# Patient Record
Sex: Male | Born: 1950 | Hispanic: Yes | Marital: Married | Smoking: Never smoker
Health system: Southern US, Community
[De-identification: ages and names within clinical notes are randomized; demographics above are authoritative.]

## PROBLEM LIST (undated history)

## (undated) DIAGNOSIS — F32A Depression, unspecified: Secondary | ICD-10-CM

## (undated) DIAGNOSIS — E785 Hyperlipidemia, unspecified: Secondary | ICD-10-CM

## (undated) DIAGNOSIS — I82409 Acute embolism and thrombosis of unspecified deep veins of unspecified lower extremity: Secondary | ICD-10-CM

## (undated) DIAGNOSIS — R4189 Other symptoms and signs involving cognitive functions and awareness: Secondary | ICD-10-CM

## (undated) DIAGNOSIS — R569 Unspecified convulsions: Secondary | ICD-10-CM

## (undated) DIAGNOSIS — I639 Cerebral infarction, unspecified: Secondary | ICD-10-CM

## (undated) DIAGNOSIS — F329 Major depressive disorder, single episode, unspecified: Secondary | ICD-10-CM

## (undated) DIAGNOSIS — R449 Unspecified symptoms and signs involving general sensations and perceptions: Secondary | ICD-10-CM

## (undated) HISTORY — DX: Major depressive disorder, single episode, unspecified: F32.9

## (undated) HISTORY — DX: Other symptoms and signs involving cognitive functions and awareness: R41.89

## (undated) HISTORY — PX: NO PAST SURGERIES: SHX2092

## (undated) HISTORY — DX: Hyperlipidemia, unspecified: E78.5

## (undated) HISTORY — DX: Unspecified symptoms and signs involving general sensations and perceptions: R44.9

## (undated) HISTORY — DX: Acute embolism and thrombosis of unspecified deep veins of unspecified lower extremity: I82.409

## (undated) HISTORY — DX: Unspecified convulsions: R56.9

## (undated) HISTORY — DX: Cerebral infarction, unspecified: I63.9

## (undated) HISTORY — DX: Depression, unspecified: F32.A

---

## 2016-07-15 ENCOUNTER — Encounter: Payer: Self-pay | Admitting: Family Medicine

## 2017-03-22 ENCOUNTER — Encounter: Payer: Self-pay | Admitting: Family Medicine

## 2017-05-16 ENCOUNTER — Ambulatory Visit (INDEPENDENT_AMBULATORY_CARE_PROVIDER_SITE_OTHER): Payer: Self-pay | Admitting: Family Medicine

## 2017-05-16 ENCOUNTER — Encounter: Payer: Self-pay | Admitting: Family Medicine

## 2017-05-16 VITALS — BP 126/75 | HR 60 | Temp 98.0°F | Resp 16 | Ht 65.0 in | Wt 189.0 lb

## 2017-05-16 DIAGNOSIS — I10 Essential (primary) hypertension: Secondary | ICD-10-CM

## 2017-05-16 DIAGNOSIS — Z23 Encounter for immunization: Secondary | ICD-10-CM

## 2017-05-16 DIAGNOSIS — Z8673 Personal history of transient ischemic attack (TIA), and cerebral infarction without residual deficits: Secondary | ICD-10-CM

## 2017-05-16 DIAGNOSIS — Z789 Other specified health status: Secondary | ICD-10-CM

## 2017-05-16 DIAGNOSIS — E7849 Other hyperlipidemia: Secondary | ICD-10-CM

## 2017-05-16 DIAGNOSIS — R531 Weakness: Secondary | ICD-10-CM

## 2017-05-16 DIAGNOSIS — K219 Gastro-esophageal reflux disease without esophagitis: Secondary | ICD-10-CM

## 2017-05-16 DIAGNOSIS — R001 Bradycardia, unspecified: Secondary | ICD-10-CM

## 2017-05-16 DIAGNOSIS — Z131 Encounter for screening for diabetes mellitus: Secondary | ICD-10-CM

## 2017-05-16 DIAGNOSIS — G4700 Insomnia, unspecified: Secondary | ICD-10-CM

## 2017-05-16 DIAGNOSIS — R42 Dizziness and giddiness: Secondary | ICD-10-CM

## 2017-05-16 LAB — POCT GLYCOSYLATED HEMOGLOBIN (HGB A1C): Hemoglobin A1C: 5.4

## 2017-05-16 MED ORDER — PANTOPRAZOLE SODIUM 40 MG PO TBEC: 40.0000 mg | DELAYED_RELEASE_TABLET | Freq: Every day | ORAL | 5 refills | Status: DC

## 2017-05-16 MED ORDER — PRAVASTATIN SODIUM 40 MG PO TABS: 40.0000 mg | ORAL_TABLET | Freq: Every day | ORAL | 5 refills | Status: DC

## 2017-05-16 MED ORDER — MECLIZINE HCL 25 MG PO TABS: 25.0000 mg | ORAL_TABLET | Freq: Three times a day (TID) | ORAL | 2 refills | Status: DC | PRN

## 2017-05-16 MED ORDER — AMLODIPINE BESYLATE 5 MG PO TABS: 5.0000 mg | ORAL_TABLET | Freq: Every day | ORAL | 5 refills | Status: DC

## 2017-05-16 NOTE — Patient Instructions (Addendum)
Will follow up by phone with any abnormal laboratory results [facility].  Complete financial assistance forms.  Will notify previous neurologist  Dr Raymond GurneyFredric Radoff for information concerning Eliquis and Plavix.   Mareos Dizziness Los mareos son un problema muy frecuente. Se trata de una sensacin de inestabilidad o desvanecimiento. Puede sentir que se va a desmayar. Los Golden West Financialmareos pueden provocarle una lesin si se tropieza o se cae. Las Dealerpersonas de todas las edades pueden sufrir Research scientist (life sciences)mareos, Biomedical engineerpero es ms frecuente en los adultos Banner Hillmayores. Esta afeccin puede tener muchas causas, entre las que se pueden Assurantmencionar los medicamentos, la deshidratacin y [place] enfermedades. Siga estas indicaciones en su casa: Comida y bebida  Beba suficiente lquido como para mantener la orina clara o de color amarillo plido. Esto evita la deshidratacin. Trate de beber ms lquidos transparentes, como agua.  No beba alcohol.  Limite el consumo de cafena si el mdico se lo indica. Verifique los ingredientes y la informacin nutricional para saber si un alimento o una bebida contienen cafena.  Limite el consumo de sal (sodio) si el mdico se lo indica. Verifique los ingredientes y la informacin nutricional para saber si un alimento o una bebida contienen sodio. Actividad  Evite los movimientos rpidos. ? Levntese de las sillas con lentitud y apyese hasta sentirse bien. ? Por la maana, sintese primero a un lado de la cama. Cuando se sienta bien, pngase lentamente de [address] mientras se sostiene de algo, hasta que sepa que ha logrado el equilibrio.  Mueva las piernas con frecuencia si debe estar de pie en un lugar durante mucho tiempo. Mientras est de pie, contraiga y relaje los msculos de las piernas.  No conduzca vehculos ni opere maquinaria pesada si se siente mareado.  Evite agacharse si se siente mareado. En su casa, coloque los objetos de modo que le resulte fcil  alcanzarlos sin Public librarianagacharse. Estilo de vida  No consuma ningn producto que contenga nicotina o tabaco, como cigarrillos y Administrator, Civil Servicecigarrillos electrnicos. Si necesita ayuda para dejar de fumar, consulte al American Expressmdico.  Trate de reducir el nivel de estrs con mtodos como el yoga o la meditacin. Hable con el mdico si necesita ayuda para controlar el nivel de estrs. Instrucciones generales  Controle sus mareos para ver si hay cambios.  Tome los medicamentos de venta libre y los recetados solamente como se lo haya indicado el mdico. Hable con el mdico si cree que los medicamentos que est tomando son la causa de sus mareos.  Infrmele a un amigo o a un familiar si se siente mareado. Pdale a esta persona que llame al mdico si observa cambios en su comportamiento.  Concurra a todas las visitas de [address] como se lo haya indicado el mdico. Esto es importante. Comunquese con un mdico si:  Los American Expressmareos persisten.  Los Golden West Financialmareos o la sensacin de Production assistant, radiodesvanecimiento empeoran.  Siente nuseas.  Se le redujo la audicin.  Aparecen nuevos sntomas.  Cuando est de pie, se siente inestable o que la habitacin da vueltas. Solicite ayuda de inmediato si:  Vomita o tiene diarrea y no puede comer ni beber nada.  Tiene dificultad para hablar, caminar, tragar o Boeingusar los brazos, las manos o las piernas.  Se siente usualmente dbil.  No piensa con claridad o tiene dificultad para armar oraciones. Es posible que un amigo o un familiar adviertan que esto ocurre.  Tiene dolor de pecho, dolor abdominal, sudoracin o Company secretaryle falta el aire.  Sufre cambios en la visin.  Tiene cualquier tipo de sangrado.  Tiene dolor de cabeza intenso.  Tiene dolor o rigidez en el cuello.  Tiene fiebre. Estos sntomas pueden representar un problema grave que constituye Radio broadcast assistant. No espere hasta que los sntomas desaparezcan. Solicite atencin mdica de inmediato. Comunquese con el servicio de emergencias de su  localidad (911 en los [place]). No conduzca por sus propios medios [employer]. Resumen  Los mareos son Neomia Dear sensacin de inestabilidad o desvanecimiento. Esta afeccin puede tener muchas causas, entre las que se pueden Assurant, la deshidratacin y [place].  Las Dealer de todas las edades pueden sufrir Research scientist (life sciences), Biomedical engineer es ms frecuente en los adultos [place].  Beba suficiente lquido como para mantener la orina clara o de color amarillo plido. No beba alcohol.  Evite los movimientos rpidos si se siente mareado. Controle sus mareos para ver si hay cambios. Esta informacin no tiene Theme park manager el consejo del mdico. Asegrese de hacerle al mdico cualquier pregunta que tenga. Document Released: 04/05/2005 Document Revised: 07/23/2016 Document Reviewed: 07/23/2016 Elsevier Interactive Patient Education  [employer].

## 2017-05-16 NOTE — Progress Notes (Signed)
Subjective:    Patient ID: Mark Robles, male    DOB: April 17, 1951, 67 y.o.   MRN: [id number]  HPI Mark Robles, a 67 year old male with a history of hypertension and CVA presents accompanied by wife to establish care.  Patient primarily speaks Spanish, utilizing video interpreter to assist with communication.  Patient recently relocated from [facility].  Patient was follow-up by both primary care and neurology.  Primary provider was Melvyn Novas, NP.  He reports a history of a CVA in 2011 resulting in left-sided weakness.  Patient completed physical therapy with minimal improvement.  His wife reports that he has decreased short-term memory.  His wife assures that he takes all prescribed medications consistently.  Patient was followed by Dr. Raymond Gurney neurologist, for history of CVA.  Patient was prescribed Plavix 75 mg daily and pravastatin 40 mg.  Patient also continued on clonazepam 1 mg twice daily for anxiety and insomnia.  Patient endorses periodic dizziness and continuous left-sided weakness.  Patient ambulates without assistance.  He denies headache, blurred vision, heart palpitations, syncope, nausea, vomiting, or diarrhea.  Patient also has a history of deep vein thrombosis to right lower extremity.  Patient was started on Eliquis 5 mg daily and has been taking medication over the past 2 years.  Patient endorses periodic bleeding to gums.  He denies rectal bleeding, abdominal pain, dysuria, or hematuria. Patient is complaining of dizziness.  Dizziness primarily occurs with standing.  He denies any recent falls.  He typically has to hold onto objects for balance.  Symptoms are exacerbated when transitioning from sitting to standing.  Patient has been treated with meclizine 25 mg 3 times per day as needed with unsatisfactory improvement.    Past Medical History:  Diagnosis Date  . Hyperlipidemia   . Stroke [facility])    Social History   Socioeconomic History  . Marital  status: Married    Spouse name: Not on file  . Number of children: Not on file  . Years of education: Not on file  . Highest education level: Not on file  Social Needs  . Financial resource strain: Not on file  . Food insecurity - worry: Not on file  . Food insecurity - inability: Not on file  . Transportation needs - medical: Not on file  . Transportation needs - non-medical: Not on file  Occupational History  . Not on file  Tobacco Use  . Smoking status: Never Smoker  . Smokeless tobacco: Never Used  Substance and Sexual Activity  . Alcohol use: No    Frequency: Never  . Drug use: No  . Sexual activity: Not on file  Other Topics Concern  . Not on file  Social History Narrative  . Not on file   There is no immunization history on file for this patient. Review of Systems  Constitutional: Negative for fever.  HENT: Negative.        Bleeding to gums  Eyes: Negative.  Negative for visual disturbance.  Respiratory: Negative.   Cardiovascular: Negative.   Gastrointestinal: Negative.   Endocrine: Negative.  Negative for polydipsia, polyphagia and polyuria.  Genitourinary: Negative.   Musculoskeletal: Negative.   Skin: Negative.   Allergic/Immunologic: Negative.   Neurological: Positive for dizziness, weakness (Less than) and light-headedness. Negative for facial asymmetry and speech difficulty.  Hematological: Negative.   Psychiatric/Behavioral: Positive for sleep disturbance.      Objective:   Physical Exam  Constitutional: He is oriented to person, place, and time. He  appears well-developed and well-nourished.  HENT:  Head: Normocephalic and atraumatic.  Right Ear: External ear normal.  Left Ear: External ear normal.  Nose: Nose normal.  Mouth/Throat: Oropharynx is clear and moist.  Eyes: Pupils are equal, round, and reactive to light.  Neck: Normal range of motion. Neck supple.  Cardiovascular: Normal rate, regular rhythm, normal heart sounds and intact distal  pulses.  Pulmonary/Chest: Effort normal and breath sounds normal.  Abdominal: Soft. Bowel sounds are normal.  Musculoskeletal: Normal range of motion.  Weakness to left side     Neurological: He is alert and oriented to person, place, and time. A sensory deficit is present. Gait abnormal.  Reflex Scores:      Tricep reflexes are 2+ on the right side and 2+ on the left side.      Bicep reflexes are 2+ on the right side and 2+ on the left side.      Brachioradialis reflexes are 2+ on the right side and 2+ on the left side.      Patellar reflexes are 2+ on the right side and 2+ on the left side.      Achilles reflexes are 2+ on the right side and 2+ on the left side. Skin: Skin is warm and dry.  Psychiatric: He has a normal mood and affect. His behavior is normal. Judgment and thought content normal.     BP 126/75 (BP Location: Right Arm, Patient Position: Sitting, Cuff Size: Large)   Pulse 60   Temp 98 F (36.7 C) (Oral)   Resp 16   Ht 5\' 5"  (1.651 m)   Wt 189 lb (85.7 kg)   SpO2 100%   BMI 31.45 kg/m  Assessment & Plan:  1. Other hyperlipidemia Patient has a history of hyperlipidemia and CVA we will continue pravastatin 40 mg with dinner and Plavix 75 mg daily.  Will also review lipid panel as results become available. - pravastatin (PRAVACHOL) 40 MG tablet; Take 1 tablet (40 mg total) by mouth daily.  Dispense: 30 tablet; Refill: 5 - Lipid Panel  2. History of CVA (cerebrovascular accident) Patient has a history of CVA in 2011.  He was started on Plavix 75 mg and statin therapy.  Patient is also taking Eliquis 5 mg twice daily for DVT.  Concerned that Plavix 75 mg and Eliquis twice daily increases overall bleeding risk.  Also, patient endorses periodic bleeding to gums.  I will review medical records as they become available to determine if continue anticoagulation therapy is warranted.  We will also send a referral to cardiology for a consult concerning further coagulation  therapy. Patient also warrants a referral to neurology for further workup and evaluation of previous CVA. Patient continues to have left-sided deficit related to CVA, he may benefit from physical and occupational therapy. Notified previous neurologist concerning medication regimen, left message awaiting return call. We will also review creatinine level while on Eliquis. - Ambulatory referral to Neurology  3. Essential hypertension Blood pressure is at goal on current medication regimen.  Will continue Norvasc 5 mg daily. Recommend the patient continues DASH diet. - amLODipine (NORVASC) 5 MG tablet; Take 1 tablet (5 mg total) by mouth daily.  Dispense: 30 tablet; Refill: 5 - CMP and Liver  4. Insomnia, unspecified type Recommend that patient continues Klonopin as previously prescribed.  5. Dizziness EKG shows sinus bradycardia.  We will send a referral to cardiology for further workup and evaluation. Patient will continue meclizine 25 mg 3 times daily as  needed for dizziness. - EKG 12-Lead - meclizine (ANTIVERT) 25 MG tablet; Take 1 tablet (25 mg total) by mouth 3 (three) times daily as needed for dizziness.  Dispense: 60 tablet; Refill: 2  6. Screening for diabetes mellitus Review hemoglobin A1c- HgB A1c  7. Gastroesophageal reflux disease without esophagitis - pantoprazole (PROTONIX) 40 MG tablet; Take 1 tablet (40 mg total) by mouth daily.  Dispense: 30 tablet; Refill: 5  8. Left-sided weakness Left-sided weakness on physical exam.  3/5 strength to left upper extremity.  Range of motion limited.  Patient also has abnormal gait with ambulation.  9. Language barrier to communication Patient primarily speaks Spanish, utilizing language line to assist with communication  10. Need for Tdap vaccination - Tdap vaccine greater than or equal to 7yo IM  11. Immunization due - Pneumococcal polysaccharide vaccine 23-valent greater than or equal to 2yo subcutaneous/IM  12.   Bradycardia Reviewed EKG, consistent with sinus bradycardia.  We will send a referral to cardiology for further workup and evaluation  RTC: 3 months for chronic conditions  Nolon Nations  MSN, FNP-C Patient Care [facility] [address] [place], [place] [address] [phone]

## 2017-05-17 ENCOUNTER — Telehealth: Payer: Self-pay

## 2017-05-17 LAB — LIPID PANEL
Chol/HDL Ratio: 3.8 ratio (ref 0.0–5.0)
Cholesterol, Total: 200 mg/dL — ABNORMAL HIGH (ref 100–199)
HDL: 52 mg/dL (ref >39)
LDL Calculated: 114 mg/dL — ABNORMAL HIGH (ref 0–99)
Triglycerides: 170 mg/dL — ABNORMAL HIGH (ref 0–149)
VLDL Cholesterol Cal: 34 mg/dL (ref 5–40)

## 2017-05-17 LAB — CMP AND LIVER
ALT: 26 IU/L (ref 0–44)
AST: 24 IU/L (ref 0–40)
Albumin: 4.4 g/dL (ref 3.6–4.8)
Alkaline Phosphatase: 43 IU/L (ref 39–117)
BILIRUBIN TOTAL: 0.3 mg/dL (ref 0.0–1.2)
BUN: 16 mg/dL (ref 8–27)
Bilirubin, Direct: 0.1 mg/dL (ref 0.00–0.40)
CHLORIDE: 101 mmol/L (ref 96–106)
CO2: 24 mmol/L (ref 20–29)
Calcium: 9.5 mg/dL (ref 8.6–10.2)
Creatinine, Ser: 0.94 mg/dL (ref 0.76–1.27)
GFR calc Af Amer: 99 mL/min/{1.73_m2} (ref >59)
GFR calc non Af Amer: 85 mL/min/{1.73_m2} (ref >59)
Glucose: 80 mg/dL (ref 65–99)
Potassium: 4.2 mmol/L (ref 3.5–5.2)
Sodium: 140 mmol/L (ref 134–144)
Total Protein: 6.8 g/dL (ref 6.0–8.5)

## 2017-05-17 NOTE — Telephone Encounter (Signed)
Called using McKessonLanguage Resources Line [phone]#[id number]. No answer. Message was left for patient to call back and left call back number. Thanks!

## 2017-05-17 NOTE — Telephone Encounter (Signed)
-----   Message from Massie MaroonLachina M Hollis, OregonFNP sent at 05/17/2017 10:47 AM EST ----- Regarding: lab results Please inform patient that cholesterol is elevated. Continue statin therapy and Plavix 75 mg as previously prescribed. Patient is also taking eliquis for history of DVT, he is to continue medication at this time. I have place a referral to cardiology for further workup and evaluation.  I have also sent a referral to neurology for stroke. Please call to obtain cash pay options for patient.  Remind his wife to complete financial assistance forms.   Thanks

## 2017-05-19 NOTE — Telephone Encounter (Signed)
Called no answer Using interpreter ID [phone]. Message was left for patient to call back. Thanks!

## 2017-05-20 NOTE — Telephone Encounter (Signed)
I have been unable to contact patient will mail letter. Thanks!

## 2017-05-20 NOTE — Telephone Encounter (Signed)
Called using McKessonLanguage Resources Line ID [phone]. NO answer ,left voicemail for patient to call back. Thanks!

## 2017-05-24 ENCOUNTER — Ambulatory Visit: Payer: Self-pay | Attending: Internal Medicine

## 2017-06-06 ENCOUNTER — Ambulatory Visit: Payer: Self-pay | Attending: Family Medicine

## 2017-06-16 ENCOUNTER — Other Ambulatory Visit: Payer: Self-pay

## 2017-06-16 ENCOUNTER — Telehealth: Payer: Self-pay

## 2017-06-16 MED ORDER — APIXABAN 5 MG PO TABS: 5.0000 mg | ORAL_TABLET | Freq: Two times a day (BID) | ORAL | 2 refills | Status: DC

## 2017-06-16 MED FILL — !ELIQUIS 5MG TABLET: 5 | 30 days supply | Qty: 60 | Fill #0

## 2017-06-16 NOTE — Telephone Encounter (Signed)
Patient's wife came in office today and asked for a refill for clonazepam for patient. She was told to get refilled at Neurology but he is not scheduled for Neurology until April 29th. This is first avaliable. He only has a couple of tablets left. Please advise.

## 2017-06-20 ENCOUNTER — Ambulatory Visit: Payer: Self-pay | Attending: Family Medicine

## 2017-06-20 ENCOUNTER — Encounter: Payer: Self-pay | Admitting: Neurology

## 2017-06-20 ENCOUNTER — Ambulatory Visit: Payer: Self-pay | Admitting: Neurology

## 2017-06-20 ENCOUNTER — Other Ambulatory Visit: Payer: Self-pay | Admitting: Family Medicine

## 2017-06-20 VITALS — BP 123/81 | HR 61 | Ht 67.0 in | Wt 193.0 lb

## 2017-06-20 DIAGNOSIS — G8110 Spastic hemiplegia affecting unspecified side: Secondary | ICD-10-CM | POA: Insufficient documentation

## 2017-06-20 DIAGNOSIS — G4700 Insomnia, unspecified: Secondary | ICD-10-CM

## 2017-06-20 DIAGNOSIS — G8114 Spastic hemiplegia affecting left nondominant side: Secondary | ICD-10-CM

## 2017-06-20 DIAGNOSIS — I679 Cerebrovascular disease, unspecified: Secondary | ICD-10-CM

## 2017-06-20 MED ORDER — CLOPIDOGREL BISULFATE 75 MG PO TABS: 75.0000 mg | ORAL_TABLET | Freq: Every day | ORAL | 11 refills | Status: DC

## 2017-06-20 MED ORDER — CLONAZEPAM 1 MG PO TABS: 1.0000 mg | ORAL_TABLET | Freq: Every day | ORAL | 0 refills | Status: DC

## 2017-06-20 MED FILL — CLOPIDOGREL 75 MG TABLET: 75 | 30 days supply | Qty: 30 | Fill #0

## 2017-06-20 NOTE — Progress Notes (Signed)
Mark Robles, a 67 year old male with a history of CVA and insomnia is requesting a refill on clonazepam 2 mg. Patient was prescribed medication prior to establishing care. Reviewed previous notes and medication was prescribed by neurologist. I will begin to titrate medication and transition to non habit forming sleep aid.   Meds ordered this encounter  Medications  . clonazePAM (KLONOPIN) 1 MG tablet    Sig: Take 1 tablet (1 mg total) by mouth at bedtime.    Dispense:  30 tablet    Refill:  0    Order Specific Question:   Supervising Provider    Answer:   Quentin AngstJEGEDE, OLUGBEMIGA E [[id number]]    Mark NationsLachina Moore Tyeson Tanimoto  MSN, FNP-C Patient Care [facility] [address]  [place], [place] [id number] [phone]

## 2017-06-20 NOTE — Progress Notes (Signed)
[facility]    Provider:  Dr Lucia GaskinsAhern Referring Provider: Massie MaroonHollis, Lachina M, FNP Primary Care Physician:  Massie MaroonHollis, Lachina M, FNP  CC:  strokes  HPI:  [name] is a 67 y.o. male here as a referral from Dr. Hart RochesterHollis for stroke. PMHx HTN. Patient is here with wife who provides much information. And interpreter. He had a stroke in 2011, he was eating and he had a facial droop, left sided weakness. He has had 3 strokes total. First one was in 2002 and "very small" due to high colesterol, recovered quickly, in 2007 and [place] and then in 2011 he had a stroke with left-sided weakness. They told him it was due to HLD but he didn';t take the medication. They changed their diet and he was exercising. Bt in 2011 he had a severe stroke, he was "on machines" for a week and almost died, brain had a lot of inflammation in the brain. He has memory loss since then. He endorses compliance to medications. They say they were on Plavix and Eliquis but ran out of Plavix last week. They have been on Plavix and Eliquis for three years. The last time they saw a neurologist was 6 months ago. They had an MRI brain last year. He has left sided numbness and weakness, improving.   Reviewed notes, labs and imaging from outside physicians, which showed:  Reviewed primary care notes, patient primarily speaks Bah[place].  [place] from [place], [place] in [place].  He reports a history of CVA in 2011 resulting in left-sided weakness.  He has decreased short-term memory.  He has been followed by neurologist for history of CVA.  He was prescribed Plavix 75 mg.  Also on pravastatin 40.  Patient is on clonazepam twice a day for anxiety and insomnia.  He has chronic left-sided weakness.  Ambulates without assistance.  Denies headaches or other symptoms.  He also has a history of deep vein thrombosis to the right lower extremity.  He was started on Eliquis 5  mg daily and has been taking medication over the past 2 years.  He also has dizziness on standing, denies any recent falls.  Symptoms are exacerbated when transitioning from sitting to standing.  Meclizine as needed.  ldl 114, hgba1c 5.4   Review of Systems: Patient complains of symptoms per HPI as well as the following symptoms: weakness, dizziness, cramps, incontinence, birthmarks. Pertinent negatives and positives per HPI. All others negative.   Social History   Socioeconomic History  . Marital status: Married    Spouse name: Not on file  . Number of children: 3  . Years of education: 6th grade  . Highest education level: Not on file  Social Needs  . Financial resource strain: Not on file  . Food insecurity - worry: Not on file  . Food insecurity - inability: Not on file  . Transportation needs - medical: Not on file  . Transportation needs - non-medical: Not on file  Occupational History  . Not on file  Tobacco Use  . Smoking status: Never Smoker  . Smokeless tobacco: Never Used  Substance and Sexual Activity  . Alcohol use: No    Frequency: Never  . Drug use: No  . Sexual activity: Not on file  Other Topics Concern  . Not on file  Social History Narrative   Lives at home with wife & 1 child   Right handed   No caffeine  Family History  Problem Relation Age of Onset  . Hypertension Mother        tachycardia, pacemaker  . Hypertension Sister   . Hypertension Brother   . Hypertension Maternal Uncle     Past Medical History:  Diagnosis Date  . Depression   . DVT (deep venous thrombosis) (HCC)   . Hyperlipidemia   . Left-sided sensory deficit present   . Seizure (HCC)   . Stroke [facility])     Past Surgical History:  Procedure Laterality Date  . NO PAST SURGERIES      Current Outpatient Medications  Medication Sig Dispense Refill  . amLODipine (NORVASC) 5 MG tablet Take 1 tablet (5 mg total) by mouth daily. 30 tablet 5  . apixaban (ELIQUIS) 5 MG TABS  tablet Take 1 tablet (5 mg total) by mouth 2 (two) times daily. 60 tablet 2  . citalopram (CELEXA) 10 MG tablet Take 10 mg by mouth daily.    . clonazePAM (KLONOPIN) 1 MG tablet Take 1 tablet (1 mg total) by mouth at bedtime. (Patient taking differently: Take 1-2 mg by mouth at bedtime. ) 30 tablet 0  . meclizine (ANTIVERT) 25 MG tablet Take 1 tablet (25 mg total) by mouth 3 (three) times daily as needed for dizziness. 60 tablet 2  . pantoprazole (PROTONIX) 40 MG tablet Take 1 tablet (40 mg total) by mouth daily. 30 tablet 5  . pravastatin (PRAVACHOL) 40 MG tablet Take 1 tablet (40 mg total) by mouth daily. 30 tablet 5  . clopidogrel (PLAVIX) 75 MG tablet Take 1 tablet (75 mg total) by mouth daily. 30 tablet 11   No current facility-administered medications for this visit.     Allergies as of 06/20/2017  . (No Known Allergies)    Vitals: BP 123/81 (BP Location: Right Arm, Patient Position: Sitting)   Pulse 61   Ht 5\' 7"  (1.702 m)   Wt 193 lb (87.5 kg)   BMI 30.23 kg/m  Last Weight:  Wt Readings from Last 1 Encounters:  06/20/17 193 lb (87.5 kg)   Last Height:   Ht Readings from Last 1 Encounters:  06/20/17 5\' 7"  (1.702 m)   Physical exam: Exam: Gen: NAD, conversant, well nourised, obese, well groomed                     CV: RRR, no MRG. No Carotid Bruits. No peripheral edema, warm, nontender Eyes: Conjunctivae clear without exudates or hemorrhage  Neuro: Detailed Neurologic Exam  Speech:    Speech is normal; fluent and spontaneous with normal comprehension.  Cognition:    The patient is oriented to person, place, and time;     recent and remote memory impaired;     language fluent;     Impaired attention, concentration, fund of knowledge Cranial Nerves:    The pupils are equal, round, and reactive to light. The fundi are normal and spontaneous venous pulsations are present. Visual fields are full to finger confrontation. Extraocular movements are intact. Trigeminal  sensation is intact and the muscles of mastication are normal. Left lower facial weakness. . The palate elevates in the midline. Hearing intact. Voice is normal. Shoulder shrug is normal. The tongue has normal motion without fasciculations.   Coordination:    No dysmetria except that appropriate for level of weakness   Gait:    spastic  Motor Observation:    Contractures at left elbow and finger flexion Tone:    Increased tone left arm and leg  Posture:    Posture is normal. normal erect    Strength:    Left hemiparesis     Sensation: left hemisensory loss     Reflex Exam:  DTR's:    Hyperreflexia left arm and leg Toes:    Equivocal\ Clonus:    Clonus left patellar.       Assessment/Plan:  67 year old with multiple strokes in the past, unfortunately I have no records will need to request. All ecords before making any further recommendations  - he has been on Plavix on Eliquis for years. Will request records. Will continue at this time, discussed increased risk of bleeding.  - Continue current medications - Followup with pcp about clonazepam - need all records before making any further recommendations - given multiple strokes in young patient, will request second opinion from Dr. Pearlean Brownie vascular specialist, after I receive and review all the records.     Naomie Dean, MD  [facility] [address] [place], [place] [address]  Phone [phone] Fax [phone]

## 2017-06-21 NOTE — Telephone Encounter (Signed)
Called using Language Resources ID [phone]. No answer a message was left letting patient know rx has been filled and he must come to office to pick up hard copy to have filled. Asked if any questions to call back to our office and callback number was left. Thanks!

## 2017-06-23 ENCOUNTER — Telehealth: Payer: Self-pay | Admitting: *Deleted

## 2017-06-23 NOTE — Telephone Encounter (Addendum)
[facility] [phone] and Dr  Lyla SonFredric [phone]. Requesting pt medical records. 06/27/17 r/c notes , notes on Katrina desk.

## 2017-07-04 MED FILL — ?PANTOPRAZOLE SOD DR 40MG T: 40 | 30 days supply | Qty: 30 | Fill #0

## 2017-07-04 MED FILL — ?PRAVASTATIN SODIUM 40MG TA: 40 | 30 days supply | Qty: 30 | Fill #0

## 2017-07-04 MED FILL — ?MECLIZINE 25MG TAB: 25 | 20 days supply | Qty: 60 | Fill #0

## 2017-07-04 MED FILL — ?AMLODIPINE BESYLATE 5 MG T: 5 MG | 30 days supply | Qty: 30 | Fill #0

## 2017-07-25 MED FILL — $ELIQUIS 5 MG TABLET: 5 | 30 days supply | Qty: 60 | Fill #1

## 2017-07-25 MED FILL — CLOPIDOGREL 75 MG TABLET: 75 | 30 days supply | Qty: 30 | Fill #1

## 2017-07-29 MED FILL — ?PANTOPRAZOLE SO DR 40MG TA: 40 | 30 days supply | Qty: 30 | Fill #1

## 2017-07-29 MED FILL — ?AMLODIPINE BESYLATE 5 MG T: 5 MG | 30 days supply | Qty: 30 | Fill #1

## 2017-08-11 ENCOUNTER — Ambulatory Visit: Payer: Self-pay | Admitting: Cardiology

## 2017-08-12 ENCOUNTER — Encounter: Payer: Self-pay | Admitting: Cardiology

## 2017-08-12 ENCOUNTER — Ambulatory Visit (INDEPENDENT_AMBULATORY_CARE_PROVIDER_SITE_OTHER): Payer: No Typology Code available for payment source | Admitting: Cardiology

## 2017-08-12 VITALS — BP 138/60 | HR 67 | Ht 65.0 in | Wt 188.4 lb

## 2017-08-12 DIAGNOSIS — I679 Cerebrovascular disease, unspecified: Secondary | ICD-10-CM

## 2017-08-12 DIAGNOSIS — R42 Dizziness and giddiness: Secondary | ICD-10-CM

## 2017-08-12 DIAGNOSIS — E785 Hyperlipidemia, unspecified: Secondary | ICD-10-CM

## 2017-08-12 DIAGNOSIS — G8110 Spastic hemiplegia affecting unspecified side: Secondary | ICD-10-CM

## 2017-08-12 DIAGNOSIS — R001 Bradycardia, unspecified: Secondary | ICD-10-CM | POA: Insufficient documentation

## 2017-08-12 MED ORDER — ROSUVASTATIN CALCIUM 20 MG PO TABS: 20.0000 mg | ORAL_TABLET | Freq: Every day | ORAL | 3 refills | Status: DC

## 2017-08-12 MED FILL — ROSUVASTATIN CALCIUM 20 MG: 20 | 30 days supply | Qty: 30 | Fill #0

## 2017-08-12 NOTE — Progress Notes (Signed)
Cardiology Consultation:    Date:  08/12/2017   ID:  Mark Robles, DOB 07-01-1950, MRN [id number]  PCP:  Mark Maroon, FNP  Cardiologist:  Gypsy Balsam, MD   Referring MD: Mark Maroon, FNP   Chief Complaint  Patient presents with  . Bradycardia  . Cerebrovascular Accident  I dizzy  History of Present Illness:    Mark Robles is a 67 y.o. male who is being seen today for the evaluation of dizziness at the request of Mark Maroon, FNP.  He is a gentleman with 3 strokes.  Last stroke left him with left-sided weakness.  Chief complaint is dizziness anytime he transposition of the body he will get significantly dizzy he learned how to transposition of the body very carefully he never passed out during my examination when I put him on the table and laying flat he was dizzy for almost a minute when he changed position again to sitting up the same situation happened.  Interestingly his EKG showed sinus bradycardia and the reason for consultation is sinus bradycardia and dizziness.  Does have any chest pain tightness squeezing pressure burning chest.  Because of stroke he got limited mobility but still is able to walk and do things.  He never had any heart trouble.  He does have history of hypertension dyslipidemia however apparently he did not take medications previously and that would lead to those 3 strokes that he suffered from.  He was also found to have DVT and he is taking anticoagulation for it.  Past Medical History:  Diagnosis Date  . Depression   . DVT (deep venous thrombosis) (HCC)   . Hyperlipidemia   . Left-sided sensory deficit present   . Seizure (HCC)   . Stroke [facility])     Past Surgical History:  Procedure Laterality Date  . NO PAST SURGERIES      Current Medications: Current Meds  Medication Sig  . amLODipine (NORVASC) 5 MG tablet Take 1 tablet (5 mg total) by mouth daily.  Marland Kitchen apixaban (ELIQUIS) 5 MG TABS tablet  Take 1 tablet (5 mg total) by mouth 2 (two) times daily.  . clopidogrel (PLAVIX) 75 MG tablet Take 1 tablet (75 mg total) by mouth daily.  . meclizine (ANTIVERT) 25 MG tablet Take 1 tablet (25 mg total) by mouth 3 (three) times daily as needed for dizziness.  . pantoprazole (PROTONIX) 40 MG tablet Take 1 tablet (40 mg total) by mouth daily.  . pravastatin (PRAVACHOL) 40 MG tablet Take 1 tablet (40 mg total) by mouth daily.     Allergies:   Patient has no known allergies.   Social History   Socioeconomic History  . Marital status: Married    Spouse name: Not on file  . Number of children: 3  . Years of education: 6th grade  . Highest education level: Not on file  Occupational History  . Not on file  Social Needs  . Financial resource strain: Not on file  . Food insecurity:    Worry: Not on file    Inability: Not on file  . Transportation needs:    Medical: Not on file    Non-medical: Not on file  Tobacco Use  . Smoking status: Never Smoker  . Smokeless tobacco: Never Used  Substance and Sexual Activity  . Alcohol use: No    Frequency: Never  . Drug use: No  . Sexual activity: Not on file  Lifestyle  . Physical activity:  Days per week: Not on file    Minutes per session: Not on file  . Stress: Not on file  Relationships  . Social connections:    Talks on phone: Not on file    Gets together: Not on file    Attends religious service: Not on file    Active member of club or organization: Not on file    Attends meetings of clubs or organizations: Not on file    Relationship status: Not on file  Other Topics Concern  . Not on file  Social History Narrative   Lives at home with wife & 1 child   Right handed   No caffeine      Family History: The patient's family history includes Hypertension in his brother, maternal uncle, mother, and sister. ROS:   Please see the history of present illness.    All 14 point review of systems negative except as described per  history of present illness.  EKGs/Labs/Other Studies Reviewed:    The following studies were reviewed today: He is cholesterol: His LDL is 114 his HDL is 52  EKG:  EKG is  ordered today.  The ekg ordered today demonstrates normal sinus rhythm normal P interval normal QS complex duration morphology no ST-T segment changes  Recent Labs: 05/16/2017: ALT 26; BUN 16; Creatinine, Ser 0.94; Potassium 4.2; Sodium 140  Recent Lipid Panel    Component Value Date/Time   CHOL 200 (H) 05/16/2017 1045   TRIG 170 (H) 05/16/2017 1045   HDL 52 05/16/2017 1045   CHOLHDL 3.8 05/16/2017 1045   LDLCALC 114 (H) 05/16/2017 1045    Physical Exam:    VS:  BP 138/60   Pulse 67   Ht 5\' 5"  (1.651 m)   Wt 188 lb 6.4 oz (85.5 kg)   SpO2 98%   BMI 31.35 kg/m     Wt Readings from Last 3 Encounters:  08/12/17 188 lb 6.4 oz (85.5 kg)  06/20/17 193 lb (87.5 kg)  05/16/17 189 lb (85.7 kg)     GEN:  Well nourished, well developed in no acute distress HEENT: Normal NECK: No JVD; No carotid bruits LYMPHATICS: No lymphadenopathy CARDIAC: RRR, no murmurs, no rubs, no gallops RESPIRATORY:  Clear to auscultation without rales, wheezing or rhonchi  ABDOMEN: Soft, non-tender, non-distended MUSCULOSKELETAL:  No edema; No deformity  SKIN: Warm and dry NEUROLOGIC:  Alert and oriented x 3 PSYCHIATRIC:  Normal affect   ASSESSMENT:    1. 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 Dizziness   3. Sinus bradycardia   4. Dyslipidemia    PLAN:    In order of problems listed above:  1. Dizziness positional I doubt very much is related to his heart.  I suspect this is vertigo.  He did not have episode of syncope.  However with his bradycardia on the EKG that need to be investigated.  I will ask him to wear 48 hours Holter monitor to make sure there is no significant slowing down his heart.  As a part of evaluation I will ask him to have an echocardiogram to look at the left ventricle ejection  fraction as well as left atrial size. 2. History of CVA he had 3 strokes in the past I suspect the reason for that was the fact that he did not take care of his blood pressure and that he did not take his medications now he is on antiplatelets anticoagulant agent.  Antiplatelet is to prevent him  from another stroke and thrombotic is to prevent him from having another DVT.  Doing echocardiogram will help to establish the size of the left 13 which may give us some idea about potentially having atrial fibrillation however he denies having any palpitations 3. Sinus bradycardia: Holter monitor will be placed however I doubt it got a significant role in his symptomatology at this now 4. Dyslipidemia his LDL is 114.  I will switch him from pravastatin to high intensity statin will put him on Crestor 20.  Fasting lipid profile will be repeated later.   Medication Adjustments/Labs and Tests Ordered: Current medicines are reviewed at length with the patient today.  Concerns regarding medicines are outlined above.  No orders of the defined types were placed in this encounter.  No orders of the defined types were placed in this encounter.   Signed, Georgeanna Leaobert J. Luismanuel Corman, MD, Northern Arizona Va Healthcare SystemFACC. 08/12/2017 10:23 AM     [facility]

## 2017-08-12 NOTE — Patient Instructions (Signed)
Medication Instructions:  Your physician has recommended you make the following change in your medication:  STOP pravastatin START crestor 20 mg daily   Labwork: None  Testing/Procedures: Your physician has requested that you have an echocardiogram. Echocardiography is a painless test that uses sound waves to create images of your heart. It provides your doctor with information about the size and shape of your heart and how well your heart's chambers and valves are working. This procedure takes approximately one hour. There are no restrictions for this procedure.  Your physician has recommended that you wear a holter monitor. Holter monitors are medical devices that record the heart's electrical activity. Doctors most often use these monitors to diagnose arrhythmias. Arrhythmias are problems with the speed or rhythm of the heartbeat. The monitor is a small, portable device. You can wear one while you do your normal daily activities. This is usually used to diagnose what is causing palpitations/syncope (passing out). Wear for 48 hours.   [facility]: [address] [place], [place] [address].  Follow-Up: Your physician recommends that you schedule a follow-up appointment in: 1 month.   Any Other Special Instructions Will Be Listed Below (If Applicable).     If you need a refill on your cardiac medications before your next appointment, please call your pharmacy.

## 2017-08-15 ENCOUNTER — Ambulatory Visit (INDEPENDENT_AMBULATORY_CARE_PROVIDER_SITE_OTHER): Payer: Self-pay | Admitting: Family Medicine

## 2017-08-15 ENCOUNTER — Ambulatory Visit: Payer: Self-pay | Admitting: Neurology

## 2017-08-15 ENCOUNTER — Encounter: Payer: Self-pay | Admitting: Family Medicine

## 2017-08-15 VITALS — BP 125/78 | HR 63 | Temp 98.5°F | Resp 16 | Ht 65.0 in | Wt 189.0 lb

## 2017-08-15 DIAGNOSIS — I10 Essential (primary) hypertension: Secondary | ICD-10-CM

## 2017-08-15 DIAGNOSIS — Z8673 Personal history of transient ischemic attack (TIA), and cerebral infarction without residual deficits: Secondary | ICD-10-CM

## 2017-08-15 DIAGNOSIS — F419 Anxiety disorder, unspecified: Secondary | ICD-10-CM

## 2017-08-15 DIAGNOSIS — E785 Hyperlipidemia, unspecified: Secondary | ICD-10-CM

## 2017-08-15 DIAGNOSIS — R42 Dizziness and giddiness: Secondary | ICD-10-CM

## 2017-08-15 DIAGNOSIS — F411 Generalized anxiety disorder: Secondary | ICD-10-CM

## 2017-08-15 LAB — POCT URINALYSIS DIPSTICK
Bilirubin, UA: NEGATIVE
GLUCOSE UA: NEGATIVE
Ketones, UA: NEGATIVE
LEUKOCYTES UA: NEGATIVE
NITRITE UA: NEGATIVE
PROTEIN UA: NEGATIVE
Spec Grav, UA: 1.01 (ref 1.010–1.025)
Urobilinogen, UA: 0.2 E.U./dL
pH, UA: 7 (ref 5.0–8.0)

## 2017-08-15 MED ORDER — HYDROXYZINE HCL 10 MG PO TABS: 10.0000 mg | ORAL_TABLET | Freq: Three times a day (TID) | ORAL | 1 refills | Status: DC | PRN

## 2017-08-15 MED ORDER — MECLIZINE HCL 25 MG PO TABS: 25.0000 mg | ORAL_TABLET | Freq: Three times a day (TID) | ORAL | 2 refills | Status: DC | PRN

## 2017-08-15 MED FILL — MECLIZINE 25 MG TABLET: 25 | 20 days supply | Qty: 60 | Fill #0

## 2017-08-15 MED FILL — hydrOXYzine HCL 10 MG TABS: 10 | 20 days supply | Qty: 60 | Fill #0

## 2017-08-15 NOTE — Patient Instructions (Addendum)
Will follow up by phone with any abnormal laboratory results Will start a trial of hydroxizine 10 mg every 8 hours as needed for anxiety or insomnia Follow up with specialist as scheduled.  Recommend a lowfat, low carbohydrate diet divided over 5-6 small meals, increase water intake to 6-8 glasses, and 150 minutes per week of cardiovascular exercise.    El seguimiento por telfono con cualquier resultado anormal de laboratorio Comenzar un ensayo de hidroxizina 10 mg cada 8 horas segn sea necesario para la ansiedad o el insomnio Seguimiento con el especialista segn lo programado.  Recomendar una dieta baja en grasas y carbohidratos dividida en 5-6 comidas pequeas, aumentar la ingesta de agua a 6-8 vasos, y 150 minutos por semana de ejercicio cardiovascular.    Hydroxyzine capsules or tablets Qu es este medicamento? La HIDROXIZINA es un antihistamnico. Este medicamento se [place] para tratar los sntomas de [place]. Tambin se [place] para tratar la ansiedad y tensin. Se puede Retail banker en combinacin con otros medicamentos para inducir el sueo antes de una operacin [place]. Este medicamento puede ser utilizado para otros usos; si tiene alguna pregunta consulte con su proveedor de atencin mdica o con su farmacutico. MARCAS COMUNES: ANX, [place]arax, Rezine, Vistaril Qu le debo informar a mi profesional de la salud antes de tomar este medicamento? Necesita saber si usted presenta alguno de los siguientes problemas o situaciones: -otras enfermedades crnicas -dificultad para Geographical information systems officer -glaucoma -enfermedad cardiaca -enfermedad renal -enfermedad heptica -enfermedad pulmonar -una reaccin alrgica o inusual a la hidroxizina, a la cetirizina, a otros medicamentos, alimentos, colorantes o conservantes -si est embarazada o buscando quedar embarazada -si est amamantando a un beb Cmo debo utilizar este medicamento? Tome este medicamento por va oral con un vaso  lleno de agua. Siga las instrucciones de la etiqueta del [place]. Este medicamento se puede tomar con alimentos o con el estmago vaco. Tome sus dosis a intervalos regulares. No tome su medicamento con una frecuencia mayor a la indicada. Hable con su pediatra para informarse acerca del uso de este medicamento en nios. Puede requerir atencin especial. Aunque este medicamento ha sido recetado a nios tan menores como de 6 aos de edad para condiciones selectivas, las precauciones se aplican. Los pacientes de ms de 65 aos de edad pueden presentar reacciones ms fuertes y Pension scheme manager dosis [employer]. Sobredosis: Pngase en contacto inmediatamente con un centro toxicolgico o una sala de urgencia si usted cree que haya tomado demasiado medicamento. ATENCIN: [employer] es solo para usted. No comparta este medicamento con nadie. Qu sucede si me olvido de una dosis? Si olvida una dosis, tmela lo antes posible. Si es casi la hora de la prxima dosis, tome slo esa dosis. No tome dosis adicionales o dobles. Qu puede interactuar con este medicamento? -alcohol -barbitricos para el sueo o para las convulsiones -medicamentos para resfros o Environmental consultant -medicamentos para la depresin, ansiedad o trastornos emocionales -analgsicos -medicamentos para conciliar el sueo -relajantes musculares Puede ser que esta lista no menciona todas las posibles interacciones. Informe a su profesional de [employer] de [employer] Inc productos a base de hierbas, medicamentos de [employer] o suplementos nutritivos que est tomando. Si usted fuma, consume bebidas alcohlicas o si utiliza drogas ilegales, indqueselo tambin a su profesional de [employer]. Algunas sustancias pueden interactuar con su medicamento. A qu debo estar atento al usar PPL Corporation? Si los sntomas no mejoran, consulte a su mdico o a su profesional de [employer]. Puede experimentar mareos o somnolencia. No conduzca ni utilice  maquinaria ni haga  nada que Scientist, research (life sciences) en estado de alerta hasta que sepa cmo le afecta este medicamento. No se siente ni se ponga de pie con rapidez, especialmente si es un paciente de edad avanzada. Esto reduce el riesgo de mareos o [employer]. El alcohol puede interferir con el efecto de [place]. Evite consumir bebidas alcohlicas. Se le podr secar la boca. Masticar chicle sin azcar, chupar caramelos duros y tomar agua en abundancia le ayudar a mantener la boca hmeda. Si el problema no desaparece o es severo, consulte a su mdico. Este medicamento puede resecarle los ojos y provocar visin borrosa. Si [place] lentes de contacto, puede sentir ciertas molestias. Las gotas lubricantes pueden ser tiles. Si el problema no desaparece o es severo, consulte a su mdico de los ojos. Si se est realizando pruebas cutneas debido a una alergia, informe a su mdico que est recibiendo [employer]. Qu efectos secundarios puedo tener al [employer] este medicamento? Efectos secundarios que debe informar a su mdico o a Producer, television/film/video de la salud tan pronto como sea posible: -pulso cardiaco rpido o irregular -dificultad para orinar -convulsiones -hablar arrastrando las palabras o confusin -temblores Efectos secundarios que, por lo general, no requieren Psychologist, prison and probation services (debe informarlos a su mdico o a su profesional de la salud si persisten o si son molestos): -estreimiento -somnolencia -fatiga -dolor de cabeza -Programme researcher, broadcasting/film/video Puede ser que esta lista no menciona todos los posibles efectos secundarios. Comunquese a su mdico por asesoramiento mdico [employer]lett[employer]ard. Usted puede informar los efectos secundarios a la FDA por telfono al 1-800-FDA-1088. Dnde debo guardar mi medicina? Mantngala fuera del alcance de los nios. Gurdela a San[employer]-SCI, entre 15 y 30 grados C (24 y 49 grados F). Mantenga el envase bien cerrado. Deseche todo el medicamento que no haya utilizado,  despus de la fecha de vencimiento. ATENCIN: Este folleto es un resumen. Puede ser que no cubra toda la posible informacin. Si usted tiene preguntas acerca de esta medicina, consulte con su mdico, su farmacutico o su profesional de Radiographer, therapeutic.  2018 Elsevier/Gold Standard (2014-05-28 00:00:00)  Colesterol elevado High Cholesterol El colesterol elevado es una afeccin en la que la sangre tiene niveles altos de una sustancia [place], cerosa y parecida a la grasa (colesterol). El organismo humano necesita una pequea cantidad de colesterol. El hgado fabrica todo el colesterol que el organismo necesita. El exceso de colesterol proviene de los alimentos que comemos. La sangre transporta el colesterol desde el hgado a travs de los vasos sanguneos. Si tiene el colesterol elevado, este puede depositarse (formar placas) en las paredes de los vasos sanguneos (arterias). Las IT trainer y la rigidez de las arterias. Las placas de colesterol aumentan el riesgo de sufrir un infarto de miocardio y un accidente cerebrovascular. Trabaje con el mdico para [employer] Corporation concentraciones de colesterol en un rango saludable. Qu incrementa el riesgo? Es ms probable que Dietitian en las personas que:  Consumen alimentos con alto contenido de grasa animal (grasa saturada) o colesterol.  Tienen sobrepeso.  No hacen suficiente ejercicio fsico.  Tienen antecedentes familiares de colesterol elevado.  Cules son los signos o los sntomas? Esta afeccin no presenta sntomas. Cmo se diagnostica? Esta afeccin podra diagnosticarse a [employer] [place] [employer]ers de [place] de [place].  Si es mayor de 20aos, es posible que el mdico le controle el colesterol cada [id number].  Los controles pueden ser ms frecuentes si ya tuvo el colesterol elevado u  otros factores de riesgo de enfermedades cardacas.  En el anlisis de sangre de [place], se determina lo  siguiente:  El colesterol "malo" (colesterol LDL). Este es el principal tipo de colesterol que causa enfermedades cardacas. El nivel recomendado de LDL es de menos de100.  El colesterol "bueno" (colesterol HDL). Este tipo ayuda a Health visitor las enfermedades cardacas limpiando las arterias y arrastrando el LDL. El nivel recomendado de HDL es de60 o superior.  Triglicridos. Estos son grasas que el organismo puede Academic librarian o quemar como fuente de [place]. El nivel recomendado de triglicridos es de menos de 150.  Colesterol total. Esta es una medicin de la cantidad total de colesterol en la sangre, que incluye el colesterol LDL, el colesterol HDL y los triglicridos. El valor saludable es de menos de200.  Cmo se trata? Esta afeccin se trata con cambios en la dieta y en el estilo de vida, y con medicamentos. Cambios en la Coca Cola, la ingesta de una mayor cantidad de cereales integrales, frutas, verduras, frutos secos y pescado.  Tambin podran incluir la reduccin del consumo de carnes rojas y alimentos con Medical laboratory scientific officer. Cambios en el estilo de vida  Entre ellos, realizar sesiones de ejercicios aerbicos durante, por lo menos, , 3veces por semana. Por ejemplo, caminar, andar en bicicleta y nadar. Los ejercicios aerbicos junto con una dieta sana pueden ayudar a que se Dietitian en un peso saludable.  Los cambios tambin podran incluir dejar de fumar. Medicamentos  Por lo general, se administran medicamentos si con los Mudlogger y en el estilo de vida no se logra reducir el colesterol hasta niveles saludables.  El mdico podra recetarle estatinas. Se ha demostrado que las estatinas [employer] Co niveles de [place], lo que puede reducir el riesgo de Marine scientist una enfermedad cardaca. Siga estas indicaciones en su casa: Comida y bebida  Si se lo indic el mdico:  Coma pollo (sin piel), pescado, ternera, mariscos, pechuga de  [place] y cortes de carne roja de pulpa o de lomo.  No coma alimentos fritos ni carnes grasosas, como salchichas y salame.  Coma muchas frutas, como manzanas.  Coma gran cantidad de verduras, como brcoli, papas y zanahorias.  Coma porotos, guisantes secos y lentejas.  Coma cereales, como cebada, arroz, cuscs y trigo burgol.  Coma pastas sin salsas con crema.  Tome PPG Industries o semidescremada, y coma yogures y quesos descremados o semidescremados.  No coma ni beba Eastman Kodak, crema, helado, yemas de huevo ni quesos duros.  No coma margarinas en barra ni untables que contengan grasas trans (que tambin se conocen como aceites parcialmente hidrogenados).  No coma aceites tropicales saturados, como el de coco y el de [place].  No coma tortas, galletas, galletitas ni otros productos horneados que contengan grasas trans.  Instrucciones generales  Haga ejercicio segn las indicaciones del mdico. Aumente la cantidad de ejercicio fsico que realiza mediante actividades como jardinera, salir a Advertising account planner o usar las escaleras.  Tome los medicamentos de venta libre y los recetados solamente como se lo haya indicado el mdico.  No consuma ningn producto que contenga nicotina o tabaco, como cigarrillos y Administrator, Civil Service. Si necesita ayuda para dejar de fumar, consulte al mdico.  Concurra a todas las visitas de control como se lo haya indicado el mdico. Esto es importante. Comunquese con un mdico si:  Tiene dificultad para seguir una dieta sana o mantener un peso saludable.  Necesita ayuda para comenzar un programa de ejercicios.  Necesita ayuda para dejar de fumar. Solicite ayuda de inmediato si:  Midwife.  Tiene dificultad para respirar. Esta informacin no tiene Theme park manager el consejo del mdico. Asegrese de hacerle al mdico cualquier pregunta que tenga. Document Released: 04/05/2005 Document Revised: 07/12/2016 Document Reviewed:  10/04/2015 Elsevier Interactive Patient Education  [employer].

## 2017-08-15 NOTE — Progress Notes (Signed)
Subjective:    Patient ID: Mark Robles, male    DOB: 11/01/1950, 67 y.o.   MRN: [id number]  HPI A 67 year old male with a history of hypertension and CVA presents accompanied by wife for a follow up of chronic conditons.  Patient primarily speaks Spanish, utilizing video interpreter to assist with communication.  Patient recently established care after relocating to this area.  He reports a history of a CVA in 2011 resulting in left-sided weakness.  Patient completed physical therapy with minimal improvement.  His wife reports that he has decreased short-term memory.  His wife assures that he takes all prescribed medications consistently.  A referral was sent to neurology and patient established care several weeks ago.  Patient is consistently taking Plavix and rosuvastatin daily.  Patient has a history of anxiety and insomnia. He was previously taking Clonazepam BID. He has been out of medication for greater than 1 month. He says that most anxiety is centered around current state of health.   Patient endorses periodic dizziness and continuous left-sided weakness.  Patient ambulates without assistance.  He denies headache, blurred vision, heart palpitations, syncope, nausea, vomiting, or diarrhea.  Patient also has a history of deep vein thrombosis to right lower extremity.  Patient was started on Eliquis 5 mg daily and has been taking medication over the past 2 years.   Past Medical History:  Diagnosis Date  . Depression   . DVT (deep venous thrombosis) (HCC)   . Hyperlipidemia   . Left-sided sensory deficit present   . Seizure (HCC)   . Stroke [facility])    Social History   Socioeconomic History  . Marital status: Married    Spouse name: Not on file  . Number of children: 3  . Years of education: 6th grade  . Highest education level: Not on file  Occupational History  . Not on file  Social Needs  . Financial resource strain: Not on file  . Food insecurity:    Worry:  Not on file    Inability: Not on file  . Transportation needs:    Medical: Not on file    Non-medical: Not on file  Tobacco Use  . Smoking status: Never Smoker  . Smokeless tobacco: Never Used  Substance and Sexual Activity  . Alcohol use: No    Frequency: Never  . Drug use: No  . Sexual activity: Not on file  Lifestyle  . Physical activity:    Days per week: Not on file    Minutes per session: Not on file  . Stress: Not on file  Relationships  . Social connections:    Talks on phone: Not on file    Gets together: Not on file    Attends religious service: Not on file    Active member of club or organization: Not on file    Attends meetings of clubs or organizations: Not on file    Relationship status: Not on file  . Intimate partner violence:    Fear of current or ex partner: Not on file    Emotionally abused: Not on file    Physically abused: Not on file    Forced sexual activity: Not on file  Other Topics Concern  . Not on file  Social History Narrative   Lives at home with wife & 1 child   Right handed   No caffeine    Immunization History  Administered Date(s) Administered  . Pneumococcal Polysaccharide-23 05/16/2017  . Tdap 05/16/2017  Review of Systems  Constitutional: Negative for fever.  HENT: Negative.        Bleeding to gums  Eyes: Negative.  Negative for visual disturbance.  Respiratory: Negative.   Cardiovascular: Negative.   Gastrointestinal: Negative.   Endocrine: Negative.  Negative for polydipsia, polyphagia and polyuria.  Genitourinary: Negative.   Musculoskeletal: Negative.   Skin: Negative.   Allergic/Immunologic: Negative.   Neurological: Positive for dizziness, weakness (Less than) and light-headedness. Negative for facial asymmetry and speech difficulty.  Hematological: Negative.   Psychiatric/Behavioral: Positive for sleep disturbance.      Objective:   Physical Exam  Constitutional: He is oriented to person, place, and time. He  appears well-developed and well-nourished.  HENT:  Head: Normocephalic and atraumatic.  Right Ear: External ear normal.  Left Ear: External ear normal.  Nose: Nose normal.  Mouth/Throat: Oropharynx is clear and moist.  Eyes: Pupils are equal, round, and reactive to light.  Neck: Normal range of motion. Neck supple.  Cardiovascular: Normal rate, regular rhythm, normal heart sounds and intact distal pulses.  Pulmonary/Chest: Effort normal and breath sounds normal.  Abdominal: Soft. Bowel sounds are normal.  Musculoskeletal: Normal range of motion.  Weakness to left side     Neurological: He is alert and oriented to person, place, and time. A sensory deficit is present. Gait abnormal.  Reflex Scores:      Tricep reflexes are 2+ on the right side and 2+ on the left side.      Bicep reflexes are 2+ on the right side and 2+ on the left side.      Brachioradialis reflexes are 2+ on the right side and 2+ on the left side.      Patellar reflexes are 2+ on the right side and 2+ on the left side.      Achilles reflexes are 2+ on the right side and 2+ on the left side. Skin: Skin is warm and dry.  Psychiatric: He has a normal mood and affect. His behavior is normal. Judgment and thought content normal.     BP 125/78 (BP Location: Right Arm, Patient Position: Sitting, Cuff Size: Normal)   Pulse 63   Temp 98.5 F (36.9 C) (Oral)   Resp 16   Ht  (1.651 m)   Wt 189 lb (85.7 kg)   SpO2 99%   BMI 31.45 kg/m  Assessment & Plan:  Dyslipidemia Patient has a history of hyperlipidemia and CVA we will continue pravastatin 40 mg with dinner and Plavix 75 mg daily.  Will also review lipid panel as results become available. - pravastatin (PRAVACHOL) 40 MG tablet; Take 1 tablet (40 mg total) by mouth daily.  Dispense: 30 tablet; Refill: 5 - Lipid Panel  History of CVA (cerebrovascular accident) Patient has a history of CVA that occurred in 2011.  He was started on Plavix 75 mg and statin  therapy.  Patient is also taking Eliquis 5 mg twice daily for DVT.  Concerned that Plavix 75 mg and Eliquis twice daily increases overall bleeding risk.  Patient continues to have left-sided deficit related to CVA, he may benefit from physical and occupational therapy.  Essential hypertension Blood pressure is at goal on current medication regimen.  Will continue Norvasc 5 mg daily. Recommend the patient continues DASH diet  - Urinalysis Dipstick - Basic Metabolic Panel  GAD (generalized anxiety disorder) - hydrOXYzine (ATARAX/VISTARIL) 10 MG tablet; Take 1 tablet (10 mg total) by mouth every 8 (eight) hours as needed.  Dispense: 60  tablet; Refill: 1  Vertigo - meclizine (ANTIVERT) 25 MG tablet; Take 1 tablet (25 mg total) by mouth 3 (three) times daily as needed for dizziness.  Dispense: 60 tablet; Refill: 2 - PT vestibular rehab; Future   RTC: 2 months for chronic conditions  Nolon Nations  MSN, FNP-C Patient Care [facility] [address] [place], [place] [address] [phone]

## 2017-08-16 LAB — BASIC METABOLIC PANEL
BUN / CREAT RATIO: 12 (ref 10–24)
BUN: 11 mg/dL (ref 8–27)
CO2: 28 mmol/L (ref 20–29)
Calcium: 10 mg/dL (ref 8.6–10.2)
Chloride: 101 mmol/L (ref 96–106)
Creatinine, Ser: 0.9 mg/dL (ref 0.76–1.27)
GFR, EST AFRICAN AMERICAN: 104 mL/min/{1.73_m2} (ref >59)
GFR, EST NON AFRICAN AMERICAN: 90 mL/min/{1.73_m2} (ref >59)
Glucose: 77 mg/dL (ref 65–99)
POTASSIUM: 4.5 mmol/L (ref 3.5–5.2)
Sodium: 141 mmol/L (ref 134–144)

## 2017-08-16 LAB — LIPID PANEL
CHOL/HDL RATIO: 3.8 ratio (ref 0.0–5.0)
CHOLESTEROL TOTAL: 198 mg/dL (ref 100–199)
HDL: 52 mg/dL (ref >39)
LDL CALC: 101 mg/dL — AB (ref 0–99)
TRIGLYCERIDES: 224 mg/dL — AB (ref 0–149)
VLDL Cholesterol Cal: 45 mg/dL — ABNORMAL HIGH (ref 5–40)

## 2017-08-17 ENCOUNTER — Other Ambulatory Visit (HOSPITAL_COMMUNITY): Payer: No Typology Code available for payment source

## 2017-08-18 ENCOUNTER — Telehealth: Payer: Self-pay

## 2017-08-18 NOTE — Telephone Encounter (Signed)
-----   Message from Massie Maroon, [place] sent at 08/17/2017  4:16 PM EDT ----- Regarding: lab results Please inform patient that LDL cholesterol is mildly elevated. Recommend a lowfat, low carbohydrate diet divided over 5-6 small meals, increase water intake to 6-8 glasses, and 150 minutes per week of cardiovascular exercise.   Nolon Nations  MSN, FNP-C Patient Care [facility] [address] [place], [place] [address] [phone]

## 2017-08-18 NOTE — Telephone Encounter (Signed)
Called using pacific interpreter Id [phone]. Advised that cholesterol was mildly elevated and recommended a lowfat/ low carb diet over 5 to 6 small meals daily. Advised to drink 6 to 8 glasses of water daily, and to exercise 150  Minutes weekly. Thanks!

## 2017-08-24 MED FILL — $ELIQUIS 5 MG TABLET: 5 | 30 days supply | Qty: 60 | Fill #2

## 2017-08-24 MED FILL — CLOPIDOGREL 75 MG TABLET: 75 | 30 days supply | Qty: 30 | Fill #2

## 2017-08-24 MED FILL — ?PANTOPRAZOLE SO DR 40MG TA: 40 | 30 days supply | Qty: 30 | Fill #2

## 2017-08-24 MED FILL — ?AMLODIPINE BESYLATE 5 MG T: 5 MG | 30 days supply | Qty: 30 | Fill #2

## 2017-08-25 ENCOUNTER — Ambulatory Visit (INDEPENDENT_AMBULATORY_CARE_PROVIDER_SITE_OTHER): Payer: No Typology Code available for payment source

## 2017-08-25 ENCOUNTER — Ambulatory Visit (HOSPITAL_COMMUNITY): Payer: No Typology Code available for payment source | Attending: Internal Medicine

## 2017-08-25 ENCOUNTER — Other Ambulatory Visit: Payer: Self-pay

## 2017-08-25 DIAGNOSIS — R42 Dizziness and giddiness: Secondary | ICD-10-CM

## 2017-08-25 DIAGNOSIS — R001 Bradycardia, unspecified: Secondary | ICD-10-CM | POA: Insufficient documentation

## 2017-09-06 ENCOUNTER — Encounter: Payer: Self-pay | Admitting: Cardiology

## 2017-09-06 ENCOUNTER — Ambulatory Visit (INDEPENDENT_AMBULATORY_CARE_PROVIDER_SITE_OTHER): Payer: No Typology Code available for payment source | Admitting: Cardiology

## 2017-09-06 VITALS — BP 110/60 | HR 65 | Ht 65.0 in | Wt 184.0 lb

## 2017-09-06 DIAGNOSIS — E785 Hyperlipidemia, unspecified: Secondary | ICD-10-CM

## 2017-09-06 DIAGNOSIS — R001 Bradycardia, unspecified: Secondary | ICD-10-CM

## 2017-09-06 DIAGNOSIS — R42 Dizziness and giddiness: Secondary | ICD-10-CM

## 2017-09-06 DIAGNOSIS — I69351 Hemiplegia and hemiparesis following cerebral infarction affecting right dominant side: Secondary | ICD-10-CM

## 2017-09-06 NOTE — Progress Notes (Signed)
Cardiology Office Note:    Date:  09/06/2017   ID:  Mark Robles, DOB 05-18-1950, MRN [id number]  PCP:  Massie Maroon, FNP  Cardiologist:  Gypsy Balsam, MD    Referring MD: Massie Maroon, FNP   Chief Complaint  Patient presents with  . 1 month follow up  Still complaining of a lot of dizziness  History of Present Illness:    Mark Robles is a 67 y.o. male who was referred to [place] because of dizziness.  He was also noted to be bradycardic.  Holter monitor was placed did not show any significant arrhythmia.  He did have few episode of dizziness while wearing a Holter monitor which showed normal sinus rhythm without any arrhythmia that could explain his dizziness.  His dizziness is positional he may have some inner ear problem.  I think he can benefit from ENT referral.  I will also ask him to have carotid ultrasounds including looking at his vertebral arteries to make sure there is no significant narrowing there.  His medical therapy appears to be appropriate for now.  In 2 weeks we will recheck his fasting lipid profile.  Past Medical History:  Diagnosis Date  . Depression   . DVT (deep venous thrombosis) (HCC)   . Hyperlipidemia   . Left-sided sensory deficit present   . Seizure (HCC)   . Stroke [facility])     Past Surgical History:  Procedure Laterality Date  . NO PAST SURGERIES      Current Medications: Current Meds  Medication Sig  . amLODipine (NORVASC) 5 MG tablet Take 1 tablet (5 mg total) by mouth daily.  Mar[place] Kitchen apixaban (ELIQUIS) 5 MG TABS tablet Take 1 tablet (5 mg total) by mouth 2 (two) times daily.  . clopidogrel (PLAVIX) 75 MG tablet Take 1 tablet (75 mg total) by mouth daily.  . hydrOXYzine (ATARAX/VISTARIL) 10 MG tablet Take 1 tablet (10 mg total) by mouth every 8 (eight) hours as needed.  . meclizine (ANTIVERT) 25 MG tablet Take 1 tablet (25 mg total) by mouth 3 (three) times daily as needed for dizziness.  . pantoprazole  (PROTONIX) 40 MG tablet Take 1 tablet (40 mg total) by mouth daily.  . rosuvastatin (CRESTOR) 20 MG tablet Take 1 tablet (20 mg total) by mouth daily.     Allergies:   Patient has no known allergies.   Social History   Socioeconomic History  . Marital status: Married    Spouse name: Not on file  . Number of children: 3  . Years of education: 6th grade  . Highest education level: Not on file  Occupational History  . Not on file  Social Needs  . Financial resource strain: Not on file  . Food insecurity:    Worry: Not on file    Inability: Not on file  . Transportation needs:    Medical: Not on file    Non-medical: Not on file  Tobacco Use  . Smoking status: Never Smoker  . Smokeless tobacco: Never Used  Substance and Sexual Activity  . Alcohol use: No    Frequency: Never  . Drug use: No  . Sexual activity: Not on file  Lifestyle  . Physical activity:    Days per week: Not on file    Minutes per session: Not on file  . Stress: Not on file  Relationships  . Social connections:    Talks on phone: Not on file    Gets together: Not on file  Attends religious service: Not on file    Active member of club or organization: Not on file    Attends meetings of clubs or organizations: Not on file    Relationship status: Not on file  Other Topics Concern  . Not on file  Social History Narrative   Lives at home with wife & 1 child   Right handed   No caffeine      Family History: The patient's family history includes Hypertension in his brother, maternal uncle, mother, and sister. ROS:   Please see the history of present illness.    All 14 point review of systems negative except as described per history of present illness  EKGs/Labs/Other Studies Reviewed:      Recent Labs: 05/16/2017: ALT 26 08/15/2017: BUN 11; Creatinine, Ser 0.90; Potassium 4.5; Sodium 141  Recent Lipid Panel    Component Value Date/Time   CHOL 198 08/15/2017 1025   TRIG 224 (H) 08/15/2017  1025   HDL 52 08/15/2017 1025   CHOLHDL 3.8 08/15/2017 1025   LDLCALC 101 (H) 08/15/2017 1025    Physical Exam:    VS:  BP 110/60   Pulse 65   Ht  (1.651 m)   Wt 184 lb (83.5 kg)   SpO2 97%   BMI 30.62 kg/m     Wt Readings from Last 3 Encounters:  09/06/17 184 lb (83.5 kg)  08/15/17 189 lb (85.7 kg)  08/12/17 188 lb 6.4 oz (85.5 kg)     GEN:  Well nourished, well developed in no acute distress HEENT: Normal NECK: No JVD; No carotid bruits LYMPHATICS: No lymphadenopathy CARDIAC: RRR, no murmurs, no rubs, no gallops RESPIRATORY:  Clear to auscultation without rales, wheezing or rhonchi  ABDOMEN: Soft, non-tender, non-distended MUSCULOSKELETAL:  No edema; No deformity  SKIN: Warm and dry LOWER EXTREMITIES: no swelling NEUROLOGIC:  Alert and oriented x 3 PSYCHIATRIC:  Normal affect   ASSESSMENT:    1. Sinus bradycardia   2. Dyslipidemia   3. Vertigo   4. Spastic hemiplegia of right dominant side as late effect of cerebral infarction [facility])    PLAN:    In order of problems listed above:  1. Sinus bradycardia: Noncritical.  We will continue present management obviously will avoid any sinus blocking agent. 2. Dyslipidemia: I switch him last time to high intensity statin in 2 weeks we will check his fasting lipid profile. 3. Vertigo: He will be referred to ENT service for evaluation. 4. Status post CVA.  Stable no new issues.  See him back in 3 months or sooner if he has a problem   Medication Adjustments/Labs and Tests Ordered: Current medicines are reviewed at length with the patient today.  Concerns regarding medicines are outlined above.  No orders of the defined types were placed in this encounter.  Medication changes: No orders of the defined types were placed in this encounter.   Signed, Georgeanna Lea, MD, [facility] 09/06/2017 11:13 AM    [facility]

## 2017-09-06 NOTE — Patient Instructions (Signed)
Medication Instructions:  Your physician recommends that you continue on your current medications as directed. Please refer to the Current Medication list given to you today.   Labwork: Your physician recommends that you return for lab work in 2 weeks: lipid panel. Please fast beforehand.   Testing/Procedures: Your physician has requested that you have a carotid duplex. This test is an ultrasound of the carotid arteries in your neck. It looks at blood flow through these arteries that supply the brain with blood. Allow one hour for this exam. There are no restrictions or special instructions.  **You have been referred to an ENT doctor. You will be contacted to schedule an appointment.    Follow-Up: Your physician wants you to follow-up in: 3 months. You will receive a reminder letter in the mail two months in advance. If you don't receive a letter, please call our office to schedule the follow-up appointment.   If you need a refill on your cardiac medications before your next appointment, please call your pharmacy.   Thank you for choosing [facility]! Mady Gemma, RN [phone]

## 2017-09-09 MED FILL — ROSUVASTATIN CALCIUM 20 MG: 20 | 30 days supply | Qty: 30 | Fill #1

## 2017-09-14 MED FILL — MECLIZINE 25 MG TABLET: 25 | 20 days supply | Qty: 60 | Fill #1

## 2017-09-14 MED FILL — hydrOXYzine HCL 10 MG TABS: 10 | 20 days supply | Qty: 60 | Fill #1

## 2017-09-20 ENCOUNTER — Telehealth: Payer: Self-pay

## 2017-09-20 LAB — LIPID PANEL
CHOLESTEROL TOTAL: 132 mg/dL (ref 100–199)
Chol/HDL Ratio: 2.6 ratio (ref 0.0–5.0)
HDL: 50 mg/dL (ref >39)
LDL CALC: 63 mg/dL (ref 0–99)
Triglycerides: 96 mg/dL (ref 0–149)
VLDL Cholesterol Cal: 19 mg/dL (ref 5–40)

## 2017-09-20 NOTE — Telephone Encounter (Signed)
Left voicemail regarding labs

## 2017-09-22 ENCOUNTER — Ambulatory Visit (HOSPITAL_BASED_OUTPATIENT_CLINIC_OR_DEPARTMENT_OTHER)
Admission: RE | Admit: 2017-09-22 | Discharge: 2017-09-22 | Disposition: A | Discharge status: Home / Self Care | Payer: Self-pay | Source: Ambulatory Visit | Attending: Cardiology | Admitting: Cardiology

## 2017-09-22 DIAGNOSIS — R42 Dizziness and giddiness: Secondary | ICD-10-CM | POA: Insufficient documentation

## 2017-09-22 DIAGNOSIS — I771 Stricture of artery: Secondary | ICD-10-CM | POA: Insufficient documentation

## 2017-09-22 DIAGNOSIS — E785 Hyperlipidemia, unspecified: Secondary | ICD-10-CM | POA: Insufficient documentation

## 2017-09-22 NOTE — Progress Notes (Signed)
   Complete carotid artery duplex has been performed. Difficult to access beyond carotid bulb. No ICA stenosis was seen but can not be ruled out.  Dorothey BasemanReel, Arlette Schaad M 09/22/2017, 1:57 PM

## 2017-09-23 ENCOUNTER — Other Ambulatory Visit: Payer: Self-pay | Admitting: Family Medicine

## 2017-09-23 MED FILL — CLOPIDOGREL 75 MG TABLET: 75 | 30 days supply | Qty: 30 | Fill #3

## 2017-09-23 MED FILL — $ELIQUIS 5 MG TABLET: 5 | 30 days supply | Qty: 60 | Fill #0

## 2017-09-23 MED FILL — ?PANTOPRAZOLE SO DR 40MG TA: 40 | 30 days supply | Qty: 30 | Fill #3

## 2017-09-26 MED FILL — ?AMLODIPINE BESYLATE 5 MG T: 5 MG | 30 days supply | Qty: 30 | Fill #3

## 2017-10-10 MED FILL — ROSUVASTATIN CALCIUM 20 MG: 20 | 30 days supply | Qty: 30 | Fill #2

## 2017-10-14 ENCOUNTER — Other Ambulatory Visit: Payer: Self-pay | Admitting: Family Medicine

## 2017-10-14 DIAGNOSIS — F419 Anxiety disorder, unspecified: Secondary | ICD-10-CM

## 2017-10-17 MED FILL — hydrOXYzine HCL 10 MG TABS: 10 | 6 days supply | Qty: 60 | Fill #0

## 2017-10-18 MED FILL — CLOPIDOGREL 75 MG TABLET: 75 | 30 days supply | Qty: 30 | Fill #4

## 2017-10-18 MED FILL — ?PANTOPRAZOLE SO DR 40MG TA: 40 | 30 days supply | Qty: 30 | Fill #4

## 2017-10-24 MED FILL — MECLIZINE 25 MG TABLET: 25 | 20 days supply | Qty: 60 | Fill #1

## 2017-10-24 MED FILL — $ELIQUIS 5 MG TABLET: 5 | 30 days supply | Qty: 60 | Fill #1

## 2017-10-24 MED FILL — ?AMLODIPINE BESYLATE 5 MG T: 5 MG | 30 days supply | Qty: 30 | Fill #4

## 2017-10-26 ENCOUNTER — Encounter: Payer: Self-pay | Admitting: Family Medicine

## 2017-10-26 ENCOUNTER — Ambulatory Visit (INDEPENDENT_AMBULATORY_CARE_PROVIDER_SITE_OTHER): Payer: No Typology Code available for payment source | Admitting: Family Medicine

## 2017-10-26 VITALS — BP 123/81 | HR 64 | Temp 98.9°F | Resp 14 | Ht 65.0 in | Wt 180.0 lb

## 2017-10-26 DIAGNOSIS — R42 Dizziness and giddiness: Secondary | ICD-10-CM

## 2017-10-26 DIAGNOSIS — I10 Essential (primary) hypertension: Secondary | ICD-10-CM

## 2017-10-26 DIAGNOSIS — I639 Cerebral infarction, unspecified: Secondary | ICD-10-CM

## 2017-10-26 DIAGNOSIS — F320 Major depressive disorder, single episode, mild: Secondary | ICD-10-CM

## 2017-10-26 DIAGNOSIS — J301 Allergic rhinitis due to pollen: Secondary | ICD-10-CM

## 2017-10-26 LAB — POCT URINALYSIS DIPSTICK
Bilirubin, UA: NEGATIVE
Glucose, UA: NEGATIVE
Ketones, UA: NEGATIVE
Leukocytes, UA: NEGATIVE
Nitrite, UA: NEGATIVE
Protein, UA: NEGATIVE
Spec Grav, UA: 1.01 (ref 1.010–1.025)
Urobilinogen, UA: 0.2 E.U./dL
pH, UA: 7 (ref 5.0–8.0)

## 2017-10-26 MED ORDER — FLUTICASONE PROPIONATE 50 MCG/ACT NA SUSP: 2.0000 | Freq: Every day | NASAL | 6 refills | Status: AC

## 2017-10-26 MED ORDER — FLUOXETINE HCL 20 MG PO TABS: 20.0000 mg | ORAL_TABLET | Freq: Every day | ORAL | 0 refills | Status: DC

## 2017-10-26 MED FILL — FLUTICASONE PROP 50 MCG SPR: 50 | 30 days supply | Qty: 16 | Fill #0

## 2017-10-26 MED FILL — FLUoxetine HCL 20 MG TABS: 20 | 30 days supply | Qty: 30 | Fill #0

## 2017-10-26 NOTE — Patient Instructions (Signed)
Depresin mayor (Major Depressive Disorder) La depresin mayor es un trastorno del estado de nimo. No es lo mismo que sentir tristeza cuando ocurre algo malo. Este trastorno puede durar das o semanas. Trabajar o hacer cosas con la familia y los amigos puede volverse difcil debido a este trastorno. La depresin mayor puede causar lo siguiente en las personas que la padecen:  Tristeza.  Vaco.  Desesperanza.  Impotencia.  Irritabilidad. Adems de estos sentimientos, las personas que padecen este trastorno tienen por lo menos 4de estos sntomas:  Problemas para dormir.  Dormir demasiado.  Un cambio importante en el apetito.  Un cambio importante en el peso.  Falta de energa.  Sentimientos de culpa o inutilidad.  Dificultad para concentrarse, recordar o tomar decisiones.  Movimientos lentos.  Agitacin.  Pensamientos o sueos de suicidio, o de daarse a s mismas.  Intento de suicidio. CUIDADOS EN EL HOGAR  Tome los medicamentos de venta libre y los recetados solamente como se lo haya indicado el mdico.  Concurra a todas las visitas de control como se lo haya indicado el mdico. Esto incluye cualquier terapia que le recomiende el mdico.  Reduzca el nivel de estrs. Haga cosas que disfruta, como andar en bicicleta, caminar o leer un libro.  Haga ejercicio fsico con frecuencia.  Consuma una dieta saludable.  No beba alcohol.  No consuma drogas.  Busque el apoyo de sus familiares y amigos.  SOLICITE AYUDA DE INMEDIATO SI:  Empieza a escuchar voces.  Ve cosas que no existen.  Siente que las personas lo persiguen (paranoico).  Tiene pensamientos serios acerca de lastimarse a usted mismo o daar a otras personas.  Piensa en el suicidio.  Esta informacin no tiene como fin reemplazar el consejo del mdico. Asegrese de hacerle al mdico cualquier pregunta que tenga. Document Released: 03/17/2015 Document Revised: 03/17/2015 Document Reviewed:  05/01/2012 Elsevier Interactive Patient Education  2017 Elsevier Inc.  

## 2017-10-26 NOTE — Progress Notes (Signed)
Subjective   Mark Robles 67 y.o. male  [id number]  [id number]  May 31, 1950    Chief Complaint  Patient presents with  . Hypertension    A 67 year old male with a history of hypertension and CVA presents for a follow up of chronic conditons.  Patient primarily speaks Spanish, utilizing video interpreter to assist with communication.  Patient is accompanied by his wife who is the primary historian. Patient states that the dizziness continues and is not relieved with antivert. Wife states that she give the patient antivert up to 3 times per day.  Patient states that he continues to have difficulty with ambulation and weakness. Not currently involved in therapy. Has not been in 2 years. Patient states that he has an appointment next week with neurology.  Patient's wife states that he was previously prescribed clonazepam due to having 1 seizure after the stroke. Wife states that while he was taking the medication, he was very calm and could sleep well. Now she reports that he is sad and down. Irritable and does not want to do anything like usual. Wife states that he "throws a fit when he doesn't get his way". Denies SI, HI, AH, VH. Patient denies previous hx of depression or anxiety.  Patient states that he is having watery eyes and runny nose most days. Wife states that he has blackened skin under his eyes due to allergies. Not currently taking any medications.   Hypertension  This is a chronic problem. The current episode started more than 1 year ago. The problem is unchanged. The problem is controlled. Associated symptoms include anxiety and headaches. Pertinent negatives include no chest pain or palpitations. Hypertensive end-organ damage includes CVA.    Review of Systems  Constitutional: Negative.   HENT: Negative.   Respiratory: Negative.   Cardiovascular: Negative.  Negative for chest pain and palpitations.  Gastrointestinal: Negative.   Genitourinary: Negative.    Musculoskeletal:       Ambulates with a cane.   Neurological: Positive for dizziness, weakness (left sided) and headaches.  Endo/Heme/Allergies: Positive for environmental allergies.  Psychiatric/Behavioral: Positive for depression. Negative for hallucinations, memory loss, substance abuse and suicidal ideas. The patient is nervous/anxious. The patient does not have insomnia.     Objective   Physical Exam  Constitutional: He is oriented to person, place, and time. He appears well-developed and well-nourished.  Cardiovascular: Normal rate, regular rhythm, normal heart sounds and intact distal pulses.  No murmur heard. Pulmonary/Chest: Effort normal and breath sounds normal. No stridor. No respiratory distress. He has no wheezes. He has no rales.  Neurological: He is alert and oriented to person, place, and time. A sensory deficit is present.  There is left sided paralysis. Ambulating with cane.   Psychiatric: He has a normal mood and affect. His behavior is normal. Judgment and thought content normal.  Nursing note and vitals reviewed.   BP 123/81 (BP Location: Right Arm, Patient Position: Sitting, Cuff Size: Normal)   Pulse 64   Temp 98.9 F (37.2 C) (Oral)   Resp 14   Ht 5\' 5"  (1.651 m)   Wt 180 lb (81.6 kg)   SpO2 100%   BMI 29.95 kg/m   Assessment   Encounter Diagnosis  Name Primary?  . Hypertension, unspecified type Yes     Plan  1. Hypertension, unspecified type Continue with norvasc as previously prescribed.  - Urinalysis Dipstick  2. Current mild episode of major depressive disorder without prior episode (HCC) Will  start patient on prozac 20 mg QAM. Patient and wife advised that if suicidal ideations or euphoria occur, stop medication and go immediately to the nearest ED. Discussed the MOA of medication and that results may not be experienced for 4-6 weeks. May experience GI symptoms such as NVD. Do not d/c medication abruptly.  - FLUoxetine (PROZAC) 20 MG  tablet; Take 1 tablet (20 mg total) by mouth daily. In the morning  Dispense: 30 tablet; Refill: 0  3. Cerebrovascular accident (CVA), unspecified mechanism (HCC) Continue with current medications  - Ambulatory referral to Occupational Therapy  4. Seasonal allergic rhinitis due to pollen Can use otc zyrtec or claritin as previously recommended by previous provider.  Start flonase daily.  - fluticasone (FLONASE) 50 MCG/ACT nasal spray; Place 2 sprays into both nostrils daily.  Dispense: 16 g; Refill: 6 5. Dizziness: We discussed that uncontrolled allergies can cause an increase in dizziness. Also discussed somatic symptoms related to depression and anxiety.  Return to care as in 2 weeks and sooner if needed.  Patient verbalized understanding and agreed with plan of care.

## 2017-10-31 ENCOUNTER — Encounter: Payer: Self-pay | Admitting: Neurology

## 2017-10-31 ENCOUNTER — Ambulatory Visit: Payer: Self-pay | Admitting: Neurology

## 2017-10-31 VITALS — BP 122/78 | HR 63 | Ht 65.0 in | Wt 179.8 lb

## 2017-10-31 DIAGNOSIS — G8110 Spastic hemiplegia affecting unspecified side: Secondary | ICD-10-CM

## 2017-10-31 NOTE — Telephone Encounter (Signed)
Rn does not have notes from patients previous MD.

## 2017-10-31 NOTE — Progress Notes (Signed)
[facility] [address] [place]. [place] [id number] [phone]       OFFICE CONSULT NOTE  Mr. [name] Date of Birth:  03/23/1951 Medical Record Number:  [id number]   Referring MD:  Naomie DeanAntonia Ahern Reason for Referral:  Stroke second opinion HPI: Mr Sherlon HandingRodriguez is a pleasant 67 year old Hispanic male who is accompanied today by his wife and a Spanish language interpreter were present throughout this visit. History is provided mostly by the patient and his wife. Patient has past medical history of hypertension hyperlipidemia and states he is had several strokes. He states the first one happened in 2006 while he was in [place] when he had transient left hemiparesis. Records for this hospitalization are not available. He recovered very well from this and had another TIA a year later in 2007. Subsequently [place]. He had his major stroke in [place]. I do not have those medical records even though they have been sent for. He apparently had significant left hemiparesis and was hospitalized for several days. He subsequently recovered and has been able to now ambulate without assistance but does have visible left leg spasticity and stiffness as well as significant left hand weakness. The patient states he also had a possible episode of seizure about 2 years ago. He was [place] the [place] and [place]. Description as per his wife was that patient was looking from side to side and had a glazed look on his face. She denied any focal extremity tonic-clonic activity. Patient was placed on clonazepam which took a little while and then he ran out. He is had no seizure-like episodes since then. He does not remember having had an EEG for brain MRI done after this event. The patient was diagnosed to have deep and thrombosis in the right leg about 4 years ago and has been on eliquis since then. He's also on Plavix as well. He  does complain of easy bruising but he is had fortunately no bleeding episode. He has not had any follow-up lower extremity ultrasound done. There is no known history of hypercoagulable disorder., Prior DVT or pulmonary embolism. The patient's chief complaint is dizziness which he describes as a feeling of imbalance. The wife states that the patient apparently get up quickly and walks fast. He has not been using a cane. He denies true vertigo. Denies sensation of presyncope or passing out.  ROS:   14 system review of systems is positive for  eye itching, chest pain, back pain, walking difficulty, neck pain, stiffness, dizziness, allergies and all other systems negative  PMH:  Past Medical History:  Diagnosis Date  . Depression   . DVT (deep venous thrombosis) (HCC)   . Hyperlipidemia   . Left-sided sensory deficit present   . Seizure (HCC)   . Stroke [facility](HCC)     Social History:  Social History   Socioeconomic History  . Marital status: Married    Spouse name: Not on file  . Number of children: 3  . Years of education: 6th grade  . Highest education level: Not on file  Occupational History  . Not on file  Social Needs  . Financial resource strain: Not on file  . Food insecurity:    Worry: Not on file    Inability: Not on file  . Transportation needs:    Medical: Not on file    Non-medical: Not on file  Tobacco Use  . Smoking status: Never Smoker  .  Smokeless tobacco: Never Used  Substance and Sexual Activity  . Alcohol use: No    Frequency: Never  . Drug use: No  . Sexual activity: Not on file  Lifestyle  . Physical activity:    Days per week: Not on file    Minutes per session: Not on file  . Stress: Not on file  Relationships  . Social connections:    Talks on phone: Not on file    Gets together: Not on file    Attends religious service: Not on file    Active member of club or organization: Not on file    Attends meetings of clubs or organizations: Not on file     Relationship status: Not on file  . Intimate partner violence:    Fear of current or ex partner: Not on file    Emotionally abused: Not on file    Physically abused: Not on file    Forced sexual activity: Not on file  Other Topics Concern  . Not on file  Social History Narrative   Lives at home with wife & 1 child   Right handed   No caffeine     Medications:   Current Outpatient Medications on File Prior to Visit  Medication Sig Dispense Refill  . amLODipine (NORVASC) 5 MG tablet Take 1 tablet (5 mg total) by mouth daily. 30 tablet 5  . clopidogrel (PLAVIX) 75 MG tablet Take 1 tablet (75 mg total) by mouth daily. 30 tablet 11  . ELIQUIS 5 MG TABS tablet TAKE 1 TABLET BY MOUTH 2 TIMES DAILY. 60 tablet 2  . FLUoxetine (PROZAC) 20 MG tablet Take 1 tablet (20 mg total) by mouth daily. In the morning 30 tablet 0  . fluticasone (FLONASE) 50 MCG/ACT nasal spray Place 2 sprays into both nostrils daily. 16 g 6  . hydrOXYzine (ATARAX/VISTARIL) 10 MG tablet TAKE 1 TABLET BY MOUTH EVERY 8 HOURS AS NEEDED. 60 tablet 1  . meclizine (ANTIVERT) 25 MG tablet Take 1 tablet (25 mg total) by mouth 3 (three) times daily as needed for dizziness. 60 tablet 2  . pantoprazole (PROTONIX) 40 MG tablet Take 1 tablet (40 mg total) by mouth daily. 30 tablet 5  . rosuvastatin (CRESTOR) 20 MG tablet Take 1 tablet (20 mg total) by mouth daily. 30 tablet 3  . loperamide (IMODIUM) 2 MG capsule TAKE 2 CAPSULES BY MOUTH INITIALLY THEN 1 CAPSULE AFTER EACH LOOSE STOOL (DO NOT EXCEED 8 CAPSULES IN 24 HOURS)  0   No current facility-administered medications on file prior to visit.     Allergies:  No Known Allergies  Physical Exam General: well developed, well nourished middle-aged Hispanic male, seated, in no evident distress Head: head normocephalic and atraumatic.   Neck: supple with no carotid or supraclavicular bruits Cardiovascular: regular rate and rhythm, no murmurs Musculoskeletal: no deformity Skin:  no  rash/petichiae Vascular:  Normal pulses all extremities  Neurologic Exam Mental Status: Awake and fully alert. Oriented to place and time. Recent and remote memory intact. Attention span, concentration and fund of knowledge appropriate. Mood and affect appropriate.  Cranial Nerves: Fundoscopic exam reveals sharp disc margins. Pupils equal, briskly reactive to light. Extraocular movements full without nystagmus. Visual fields full to confrontation. Hearing intact. Facial sensation intact. Mild left lower facial weakness. tongue, palate moves normally and symmetrically.  Motor: Spastic left hemiplegia with 3/5 left upper extremity and 4/5 left lower extremity strength with significant weakness of left grip and intrinsic hand muscles with  non-fixed flexion contractures of the right fingers and hand. Mild weakness of left hip flexors and ankle dorsiflexors. Normal strength on the right side. Tone is increased on the left side with mild spasticity.  Sensory.: intact to touch , pinprick , position and vibratory sensation.  Coordination: Normal on the right side and impacted in the left upper and lower extremity.  Gait and Station: Arises from chair without difficulty. Spastic hemiplegic gait with circumduction of the left leg.  Reflexes: 2+ and asymmetric and brisker on the left side Toes downgoing.   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 Etiology of stroke likely small vessel disease. He is on chronic anticoagulation with eliquis for DVT. Vascular risk factors of hypertension and hyperlipidemia. Complain of dizziness mostly related to imbalance when he tries to walk quickly.     PLAN: I had a long d/w patient and his wife using a spanish language interpreter about his remote  likely right subcortical Stroke and TIAs, spastic left hemiplegia, dizziness , risk for recurrent stroke/TIAs,   and answered questions.unfortunately no prior imaging for stroke evaluation results were available for my review today Continue Plavix for secondary stroke prevention and maintain strict control of hypertension with blood pressure goal below 130/90, diabetes with hemoglobin A1c goal below 6.5% and lipids with LDL cholesterol goal below 70 mg/dL. I also advised the patient to eat a healthy diet with plenty of whole grains, cereals, fruits and vegetables, exercise regularly and maintain ideal body weight. I feel the patient's dizziness is mostly gait instability related to his spasticity.  . We will try to send for records from his previous hospitalization in [place]. all times and we discussed fall and safety prevention precautions. The patient has history of deep vein thrombosis 4 years ago and has been on eliquis am not sure whether he needs to be on it long-term. Check lower extremity venous ultrasound and if DVT is resolved may consider discontinuing eliquis. I have advised him to see his primary care physician for the same. Check EEG for any any electrical irritability given his prior history of questionable seizure. No routine follow-up appointment with me is necessary. Follow-up with Dr.Ahern. as needed. Greater than 50% time during this 45 minute consultation visit was spent on counseling and coordination of care about his remote strokes and possible seizure and answering questions Delia Heady, MD  [facility] [address] [place], [place] [address]  Phone [phone] Fax [phone] Note: This document was prepared with digital dictation and possible smart phrase technology. Any transcriptional errors that result from this process are unintentional.

## 2017-10-31 NOTE — Patient Instructions (Signed)
I had a long d/w patient and his wife using a spanish language interpreter about his remote  likely right subcortical Stroke and TIAs, spastic left hemiplegia, dizziness , risk for recurrent stroke/TIAs,  and answered questions.unfortunately no prior imaging for stroke evaluation results were available for my review today Continue Plavix for secondary stroke prevention and maintain strict control of hypertension with blood pressure goal below 130/90, diabetes with hemoglobin A1c goal below 6.5% and lipids with LDL cholesterol goal below 70 mg/dL. I also advised the patient to eat a healthy diet with plenty of whole grains, cereals, fruits and vegetables, exercise regularly and maintain ideal body weight. I feel the patient's dizziness is mostly gait instability related to his spasticity. I encouraged him to use his cane because of irritability given his prior history of questionable seizure. We will try to send for records from his previous hospitalization in [place]. all times and we discussed fall and safety prevention precautions. The patient has history of deep vein thrombosis 4 years ago and has been on eliquis am not sure whether he needs to be on it long-term. Check lower extremity venous ultrasound and if DVT is resolved may consider discontinuing eliquis. I have advised him to see his primary care physician for the same. Check EEG for any any electrical irritability given his prior history of questionable seizure. No routine follow-up appointment with me is necessary. Follow-up with Dr.Ahern. as needed Prevencin de cadas en el hogar Fall Prevention in the Home Las cadas pueden causar lesiones y Audiological scientistafectar a personas de todas las edades. Hay muchas cosas simples que puede hacer para que su casa sea un lugar seguro y ayudar a prevenir las cadas. Qu puedo hacer en el exterior de mi casa?  Repare habitualmente los bordes de las aceras y las Holualoacalzadas, as Valero Energycomo las grietas.  Retire los American International Groupumbrales  altos.  Recorte los arbustos en el camino principal de ingreso a Hotel managersu hogar.  Use una iluminacin brillante en el exterior.  Elimine los residuos y las cosas Eaton Corporationamontonadas en los pasillos, lo que incluye herramientas y piedras.  Verifique con frecuencia que los pasamanos estn bien ajustados y en buen [place]. Todas las escaleras deben tener pasamanos en ambos lados.  Instale barandillas de proteccin en los bordes de las terrazas y Soil scientistgaleras elevadas.  Limpie regularmente las hojas, la nieve y el hielo.  Utilice arena o sal en los pasillos durante los meses de invierno.  En el garaje, limpie de inmediato cualquier derrame, incluidos los derrames de grasa y aceite. Qu puedo hacer en el bao?  Use luces nocturnas.  Instale barras para sostn en el inodoro, en la baera y en la ducha. No use los toalleros como barras para sostn.  Utilice alfombras o calcomanas antideslizantes en el piso de la baera o ducha.  Si necesita sentarse mientras est debajo de la ducha, use un banco plstico antideslizante.  Mantenga el piso seco. Seque de inmediato cualquier derrame de agua en el piso.  Elimine regularmente la acumulacin de jabn en la baera o la ducha.  Asegure las alfombras del bao con una cinta antideslizante doble faz para alfombras.  Quite las alfombras y todo lo que sea un riesgo de tropiezo. Qu puedo hacer en la habitacin?  Use luces nocturnas.  Asegrese de tener una luz de fcil acceso al lado de la cama.  No use sbanas o mantas muy grandes que se amontonen Charles Schwabsobre el piso.  Tenga una silla firme con apoyabrazos para usar cuando se vista.  Quite las alfombras y todo lo que sea un riesgo de tropiezo. Qu puedo hacer en la cocina?  Limpie de inmediato cualquier derrame.  Evite caminar sobre pisos mojados.  Coloque los AutoNation [place] con frecuencia en lugares de fcil acceso.  Si necesita alcanzar algo que est elevado, use una escalera firme con una barra de  apoyo.  Mantenga los cables elctricos fuera del camino.  No use un pulidor o cera para pisos que dejen los pisos resbaladizos. Si debe usar cera, asegrese de que sea cera antideslizante para pisos.  Quite las alfombras y todo lo que sea un riesgo de tropiezo. Qu puedo hacer en las escaleras?  No deje ningn objeto en las escaleras.  Asegrese de que haya pasamanos en ambos lados de las escaleras. Repare los pasamanos que estn flojos o rotos. Asegrese de que los pasamanos tengan la misma longitud que la escalera.  Verifique que las alfombras estn bien adheridas a las escaleras. Arregle las alfombras flojas o gastadas.  Evite colocar alfombras en la parte superior o inferior de las escaleras, o asegure las alfombras con cinta adhesiva para alfombras a fin de evitar que se muevan.  Asegrese de tener un interruptor de luz en la parte superior e inferior de las escaleras. Si no lo tiene, instale uno. Hay otros consejos para prevenir cadas?  Use calzado UGI Corporation dedos que le quede bien y [employer]. Use calzado con suela de goma o taco bajo.  Cuando use una escalera de [place], asegrese de que est abierta por completo y de que los lados estn bien asegurados. Pdale a alguien Estate manager/land agent la est [place]. No suba a una escalera de mano cerrada.  Aada pintura de contraste o cinta de colores a las barras para sostn y los pasamanos en su casa. Coloque tiras de contraste de Higher education careers adviser y el ltimo escaln.  Utilice dispositivos de [place] para la movilidad, como bastones, andadores, patinetes con soporte para el pie y [place].  Prenda las luces si est oscuro. Reemplace las bombillas que se hayan quemado.  Disponga los muebles de modo de que el camino est despejado. Deje los muebles siempre en Designer, jewellery.  Arregle las superficies desparejas del piso.  Para la escalera, elija un diseo de alfombra que no oculte el borde de los  [employer]a [employer].  Est atento a las D.R. Horton[employer] Inc.  Revise los medicamentos con el mdico. Algunos medicamentos pueden causar mareos o cambios en la presin arterial, lo que aumenta el riesgo de cadas. Hable con el mdico sobre [address] de reducir el riesgo de cadas. Esto puede incluir trabajar con un fisioterapeuta o un entrenador para mejorar la fuerza, el equilibrio y Systems analyst. Esta informacin no tiene Theme park manager el consejo del mdico. Asegrese de hacerle al mdico cualquier pregunta que tenga. Document Released: 07/13/2007 Document Revised: 06/18/2016 Document Reviewed: 05/10/2014 Elsevier Interactive Patient Education  2018 Arvin[employer]itor.  Prevencin del ictus Stroke Prevention Algunas enfermedades o conductas se asocian a un aumento en el riesgo de sufrir un accidente cerebrovascular. Puede ayudar a prevenir un accidente cerebrovascular optando por una alimentacin y un estilo de vida ms saludables y haciendo otros cambios, incluyendo Chief Operating Officer cualquier afeccin mdica que tenga.  Qu cambios nutricionales se pueden realizar?  Consuma alimentos saludables. Puede hacer lo siguiente: ? Elija alimentos ricos en fibra, como frutas y verduras frescas, y cereales integrales. ? Coma 5 o ms porciones de frutas y verduras al da.  Trate de que la mitad del plato de cada comida sea de frutas y verduras. ? Elija alimentos con protenas magras, como cortes de carne magros, carne de ave sin piel, pescado, tofu, frijoles y nueces. ? Coma productos lcteos descremados. ? Evite los alimentos con alto contenido de sal (sodio). Esto puede ayudar a Personal assistant presin arterial. ? Evite los alimentos que contienen grasas saturadas, grasas trans y colesterol. Esto puede ayudar a Architectural technologist. ? Evite los alimentos procesados ??y precocinados.  Siga las pautas especficas de su mdico para perder peso, Chief Operating Officer la presin arterial alta (hipertensin), reducir el colesterol alto y  controlar la diabetes. Estos pueden incluir lo siguiente: ? Reducir su ingesta calrica diaria. ? Limitar su consumo diario de sodio a 1500 miligramos (mg). ? Usar solo aceites saludables para cocinar, como el aceite de [place], [place] o [place]. ? Contar su consumo diario de carbohidratos. Qu cambios en el estilo de vida se pueden realizar?  Mantenga un peso saludable. Hable con el mdico sobre su peso ideal.  [place] por lo menos de actividad fsica moderada 5 das por [place]. Actividad fsica moderada incluye caminar rpido, andar en bicicleta y nadar.  No consuma ningn producto que contenga nicotina o tabaco, como cigarrillos y Administrator, Civil Service. Si necesita ayuda para dejar de fumar, consulte al American Express. Tambin puede ser til evitar la exposicin al humo de [place].  Limite el consumo de alcohol a no ms de por da si es mujer y no est [place], y por da si es hombre. Una medida equivale a 12oz ( ) de cerveza, 5oz ( ) de vino o 1oz (44ml) de bebidas alcohlicas de alta graduacin.  Detenga el consumo de drogas ilegales.  Evite tomar pldoras anticonceptivas. Hable con su mdico Fortune Brands de tomar pldoras anticonceptivas si: ? Es mayor de 35aos. ? Fuma. ? Tiene migraas. ? Alguna vez ha tenido un cogulo de [place]. Qu otros cambios se pueden realizar?  Controle los niveles de [place]. ? Llevar una dieta saludable es importante para prevenir el colesterol alto. Si el colesterol no puede controlarse nicamente con la dieta, es posible que tambin deba tomar medicamentos. ? Baxter International para Public house manager como se lo haya indicado su mdico.  Controle su diabetes. ? Llevar una dieta saludable y hacer ejercicio regularmente son partes importantes del control de su nivel de Banker. Si su nivel de azcar en la sangre no puede controlarse mediante dieta y ejercicio, es posible que deba  tomar medicamentos. ? Baxter International para controlar la diabetes como se lo haya indicado su mdico.  Controle su hipertensin. ? Para reducir su riesgo de accidente cerebrovascular, trate de mantener su presin arterial por debajo de 130/80. ? Llevar una dieta saludable y hacer ejercicio regularmente son partes importantes del control de su presin arterial. Si su presin arterial no puede controlarse mediante dieta y ejercicio, es posible que deba tomar medicamentos. ? Baxter International para controlar la hipertensin como se lo haya indicado su mdico. ? Pregntele a su mdico si debera medirse su presin arterial en el hogar. ? Controle su presin arterial todos los aos, incluso si su presin arterial es normal. La presin arterial aumenta con la edad y algunas afecciones mdicas.  Hgase una evaluacin de los trastornos del sueo (apnea del sueo). Hable con su mdico sobre cmo hacer una evaluacin del sueo si ronca mucho o tiene excesiva somnolencia.  CenterPoint Energy medicamentos de venta [place]  y los recetados solamente como se lo haya indicado el mdico. Es posible que se le recomiende tomar aspirina o un diluyente de la sangre (antiplaquetario o Environmental education officer) para reducir Nurse, adult de formacin de cogulos de sangre que pueden provocar una accidente cerebrovascular.  Asegrese de tener bajo control cualquier otra afeccin mdica que tenga, como la fibrilacin auricular o la aterosclerosis. Cules son las seales de alerta de un accidente cerebrovascular? Los signos de advertencia de un accidente cerebrovascular se pueden recordar fcilmente como: "BEFAST".  B es por "balance" (equilibrio). Algunos signos son los siguientes: ? Mareos. ? Prdida del equilibrio o de la coordinacin. ? Dificultad repentina para caminar.  E es por "eyes" (ojos). Algunos signos son los siguientes: ? Un cambio repentino en la visin. ? Dificultad para ver.  F es por "face" (rostro). Algunos  signos son los siguientes: ? Debilidad o adormecimiento del rostro. ? Su rostro o prpado se caen hacia un lado.  A es por "arms" (brazo). Algunos signos son los siguientes: ? Debilidad o adormecimiento sbito del brazo, generalmente en un lado del cuerpo.  S es por "speech" (habla). Algunos signos son los siguientes: ? Dificultad para hablar (afasia). ? Dificultad para comprender.  T es por "time" Animal nutritionist). ? Estos sntomas pueden representar un problema grave que constituye Radio broadcast assistant. No espere hasta que los sntomas desaparezcan. Solicite atencin mdica de inmediato. Llame a su servicio de Marine scientist (911 en los Estados [place]idos). No conduzca por sus propios medios [employer] hospital.  Otros signos de un accidente cerebrovascular pueden incluir: ? Dolor de cabeza sbito e intenso que no tiene causa aparente. ? Nuseas o vmitos. ? Convulsiones.  Dnde encontrar ms informacin: Para ms informacin, visite el siguiente enlace:  American Stroke Association (Asociacin Americana de Accidente Cerebrovascular): www.strokeassociation.org  National Stroke Association Engineering geologist de Accidente Cerebrovascular): www.stroke.org  Resumen  Puede prevenir un accidente cerebrovascular optando por una alimentacin saludable, hacer ejercicio, no fumar, limitar el consumo de alcohol, y manteniendo bajo control cualquier afeccin mdica que tenga.  No consuma ningn producto que contenga nicotina o tabaco, como cigarrillos y Administrator, Civil Service. Si necesita ayuda para dejar de fumar, consulte al American Express. Tambin puede ser til evitar la exposicin al humo de [place].  Recuerde las seales de alerta de un accidente cerebrovascular "BEFAST". Obtenga ayuda de inmediato si usted o un ser querido presenta alguna de estas seales de Control and instrumentation engineer. Esta informacin no tiene Theme park manager el consejo del mdico. Asegrese de hacerle al mdico cualquier pregunta que  tenga. Document Released: 04/05/2005 Document Revised: 08/17/2016 Document Reviewed: 08/17/2016 Elsevier Interactive Patient Education  2018 ArvinMeritor.

## 2017-11-01 ENCOUNTER — Ambulatory Visit (INDEPENDENT_AMBULATORY_CARE_PROVIDER_SITE_OTHER): Payer: Self-pay | Admitting: Neurology

## 2017-11-01 DIAGNOSIS — R569 Unspecified convulsions: Secondary | ICD-10-CM

## 2017-11-01 DIAGNOSIS — G8110 Spastic hemiplegia affecting unspecified side: Secondary | ICD-10-CM

## 2017-11-09 ENCOUNTER — Telehealth: Payer: Self-pay | Admitting: Neurology

## 2017-11-09 MED FILL — ROSUVASTATIN CALCIUM 20 MG: 20 | 30 days supply | Qty: 30 | Fill #3

## 2017-11-09 NOTE — Telephone Encounter (Signed)
Called the patient's wife and reviewed the EEG results. I had to have help with a translator as they do not speak english. pts wife verbalized understanding of results and have requested to scheduled their 6 mth apt. Pt was scheduled.

## 2017-11-09 NOTE — Telephone Encounter (Signed)
-----   Message from Pramod S Sethi, MD sent at 11/08/2017  5:37 PM EDT ----- Kindly inform the patient that EEG study was normal 

## 2017-11-11 ENCOUNTER — Telehealth: Payer: Self-pay

## 2017-11-11 ENCOUNTER — Ambulatory Visit (HOSPITAL_COMMUNITY)
Admission: RE | Admit: 2017-11-11 | Discharge: 2017-11-11 | Disposition: A | Discharge status: Home / Self Care | Payer: Self-pay | Source: Ambulatory Visit | Attending: Neurology | Admitting: Neurology

## 2017-11-11 DIAGNOSIS — G8110 Spastic hemiplegia affecting unspecified side: Secondary | ICD-10-CM | POA: Insufficient documentation

## 2017-11-11 NOTE — Telephone Encounter (Signed)
Thanks Katrina kindly inform patient

## 2017-11-11 NOTE — Progress Notes (Signed)
LE venous duplex prelim: negative for DVT.  Farrel DemarkJill Eunice, RDMS, RVT   Called results to [facility].

## 2017-11-11 NOTE — Telephone Encounter (Signed)
[facility] called to report that patient's scan was negative for DVT.

## 2017-11-14 NOTE — Telephone Encounter (Signed)
Left vm for patients daughter to call back about venous ultrasound of legs.

## 2017-11-15 NOTE — Telephone Encounter (Signed)
Notes recorded by Hildred AlaminMurrell, Katrina Y, RN on 11/15/2017 at 3:54 PM EDT Rn spoke with patients daughter Doy MinceLUna about venous ultrasound. Rn stated it was negative for blood clots per Dr. Pearlean BrownieSEthi. PTs daughter verbalized understanding. ------

## 2017-11-17 ENCOUNTER — Other Ambulatory Visit: Payer: Self-pay | Admitting: Family Medicine

## 2017-11-17 DIAGNOSIS — K219 Gastro-esophageal reflux disease without esophagitis: Secondary | ICD-10-CM

## 2017-11-17 MED FILL — ?PANTOPRAZOLE SO DR 40MG TA: 40 | 30 days supply | Qty: 30 | Fill #0

## 2017-11-17 MED FILL — CLOPIDOGREL 75 MG TABLET: 75 | 30 days supply | Qty: 30 | Fill #5

## 2017-11-29 ENCOUNTER — Other Ambulatory Visit: Payer: Self-pay | Admitting: Family Medicine

## 2017-11-29 DIAGNOSIS — F320 Major depressive disorder, single episode, mild: Secondary | ICD-10-CM

## 2017-11-29 MED FILL — $ELIQUIS 5 MG TABLET: 5 | 30 days supply | Qty: 60 | Fill #0

## 2017-11-29 MED FILL — FLUoxetine HCL 20 MG TABS: 20 | 30 days supply | Qty: 30 | Fill #0

## 2017-11-29 MED FILL — hydrOXYzine HCL 10 MG TABS: 10 | 20 days supply | Qty: 60 | Fill #1

## 2017-12-02 ENCOUNTER — Ambulatory Visit: Payer: Self-pay

## 2017-12-06 ENCOUNTER — Ambulatory Visit: Payer: Self-pay

## 2017-12-06 ENCOUNTER — Ambulatory Visit: Payer: Self-pay | Attending: Family Medicine

## 2017-12-06 MED FILL — ?MECLIZINE HCL 25MG TABS: 25 | 20 days supply | Qty: 60 | Fill #2

## 2017-12-06 MED FILL — FLUTICASONE PROP 50 MCG SPR: 50 | 30 days supply | Qty: 16 | Fill #1

## 2017-12-08 ENCOUNTER — Other Ambulatory Visit: Payer: Self-pay

## 2017-12-08 DIAGNOSIS — F320 Major depressive disorder, single episode, mild: Secondary | ICD-10-CM

## 2017-12-08 DIAGNOSIS — I10 Essential (primary) hypertension: Secondary | ICD-10-CM

## 2017-12-08 MED ORDER — ROSUVASTATIN CALCIUM 20 MG PO TABS: 20.0000 mg | ORAL_TABLET | Freq: Every day | ORAL | 3 refills | Status: DC

## 2017-12-08 MED ORDER — AMLODIPINE BESYLATE 5 MG PO TABS: 5.0000 mg | ORAL_TABLET | Freq: Every day | ORAL | 5 refills | Status: DC

## 2017-12-08 MED ORDER — FLUOXETINE HCL 20 MG PO TABS: ORAL_TABLET | ORAL | 0 refills | Status: DC

## 2017-12-08 MED FILL — ROSUVASTATIN CALCIUM 20 MG: 20 | 30 days supply | Qty: 30 | Fill #0

## 2017-12-08 MED FILL — ?AMLODIPINE BESYLATE 5 MG T: 5 MG | 30 days supply | Qty: 30 | Fill #0

## 2017-12-26 ENCOUNTER — Other Ambulatory Visit: Payer: Self-pay | Admitting: Family Medicine

## 2017-12-26 DIAGNOSIS — F320 Major depressive disorder, single episode, mild: Secondary | ICD-10-CM

## 2017-12-26 MED FILL — ?CLOPIDOGREL 75MG TA: 75 | 30 days supply | Qty: 30 | Fill #6

## 2017-12-26 MED FILL — ?PANTOPRAZOLE SO DR 40MG TA: 40 | 30 days supply | Qty: 30 | Fill #1

## 2017-12-28 ENCOUNTER — Ambulatory Visit: Payer: No Typology Code available for payment source | Admitting: Family Medicine

## 2018-01-02 ENCOUNTER — Other Ambulatory Visit: Payer: Self-pay | Admitting: Family Medicine

## 2018-01-02 DIAGNOSIS — F419 Anxiety disorder, unspecified: Secondary | ICD-10-CM

## 2018-01-02 DIAGNOSIS — F320 Major depressive disorder, single episode, mild: Secondary | ICD-10-CM

## 2018-01-02 MED FILL — AMLODIPINE BESYLATE 5 MG TA: 5 | 30 days supply | Qty: 30 | Fill #1

## 2018-01-02 MED FILL — hydrOXYzine HCL 10 MG TABS: 10 | 20 days supply | Qty: 60 | Fill #0

## 2018-01-02 MED FILL — $ELIQUIS 5 MG TABLET: 5 | 30 days supply | Qty: 60 | Fill #1

## 2018-01-16 MED FILL — ROSUVASTATIN CALCIUM 20 MG: 20 | 30 days supply | Qty: 30 | Fill #1

## 2018-01-25 ENCOUNTER — Other Ambulatory Visit: Payer: Self-pay | Admitting: Family Medicine

## 2018-01-25 DIAGNOSIS — R42 Dizziness and giddiness: Secondary | ICD-10-CM

## 2018-01-25 MED FILL — ?PANTOPRAZOLE SO DR 40MG TA: 40 | 30 days supply | Qty: 30 | Fill #2

## 2018-01-25 MED FILL — ?CLOPIDOGREL 75MG TA: 75 | 30 days supply | Qty: 30 | Fill #7

## 2018-01-26 MED FILL — ?MECLIZINE HCL 25MG TABS: 25 | 20 days supply | Qty: 60 | Fill #0

## 2018-02-01 MED FILL — $ELIQUIS 5 MG TABLET: 5 | 30 days supply | Qty: 60 | Fill #2

## 2018-02-08 MED FILL — AMLODIPINE BESYLATE 5 MG TA: 5 | 30 days supply | Qty: 30 | Fill #2

## 2018-02-16 MED FILL — ROSUVASTATIN CALCIUM 20 MG: 20 | 30 days supply | Qty: 30 | Fill #2

## 2018-02-22 MED FILL — ?CLOPIDOGREL 75MG TA: 75 | 30 days supply | Qty: 30 | Fill #8

## 2018-02-22 MED FILL — ?MECLIZINE HCL 25MG TABS: 25 | 20 days supply | Qty: 60 | Fill #1

## 2018-02-22 MED FILL — ?PANTOPRAZOLE SOD DR 40MG T: 40 | 30 days supply | Qty: 30 | Fill #3

## 2018-03-09 ENCOUNTER — Other Ambulatory Visit: Payer: Self-pay | Admitting: Family Medicine

## 2018-03-14 MED FILL — ?MECLIZINE HCL 25MG TABS: 25 | 20 days supply | Qty: 60 | Fill #2

## 2018-03-14 MED FILL — $ELIQUIS 5 MG TABLET: 5 | 30 days supply | Qty: 60 | Fill #2

## 2018-03-14 MED FILL — hydrOXYzine HCL 10 MG TABS: 10 | 20 days supply | Qty: 60 | Fill #1

## 2018-03-14 MED FILL — ?AMLODIPINE BESYLATE 5 MG T: 5 | 30 days supply | Qty: 30 | Fill #3

## 2018-03-23 ENCOUNTER — Encounter: Payer: Self-pay | Admitting: Family Medicine

## 2018-03-23 ENCOUNTER — Ambulatory Visit (INDEPENDENT_AMBULATORY_CARE_PROVIDER_SITE_OTHER): Payer: Self-pay | Admitting: Family Medicine

## 2018-03-23 VITALS — BP 142/90 | HR 66 | Temp 98.7°F | Ht 65.0 in | Wt 174.0 lb

## 2018-03-23 DIAGNOSIS — I10 Essential (primary) hypertension: Secondary | ICD-10-CM | POA: Insufficient documentation

## 2018-03-23 DIAGNOSIS — I639 Cerebral infarction, unspecified: Secondary | ICD-10-CM

## 2018-03-23 DIAGNOSIS — K219 Gastro-esophageal reflux disease without esophagitis: Secondary | ICD-10-CM

## 2018-03-23 DIAGNOSIS — F320 Major depressive disorder, single episode, mild: Secondary | ICD-10-CM

## 2018-03-23 DIAGNOSIS — E785 Hyperlipidemia, unspecified: Secondary | ICD-10-CM

## 2018-03-23 DIAGNOSIS — I69351 Hemiplegia and hemiparesis following cerebral infarction affecting right dominant side: Secondary | ICD-10-CM

## 2018-03-23 DIAGNOSIS — Z789 Other specified health status: Secondary | ICD-10-CM | POA: Insufficient documentation

## 2018-03-23 LAB — POCT URINALYSIS DIP (MANUAL ENTRY)
Bilirubin, UA: NEGATIVE
Glucose, UA: NEGATIVE mg/dL
Ketones, POC UA: NEGATIVE mg/dL
Leukocytes, UA: NEGATIVE
Nitrite, UA: NEGATIVE
Protein Ur, POC: NEGATIVE mg/dL
Spec Grav, UA: 1.015 (ref 1.010–1.025)
Urobilinogen, UA: 0.2 E.U./dL
pH, UA: 7 (ref 5.0–8.0)

## 2018-03-23 MED ORDER — FAMOTIDINE 20 MG PO TABS: 20.0000 mg | ORAL_TABLET | Freq: Two times a day (BID) | ORAL | 2 refills | Status: DC

## 2018-03-23 MED ORDER — CLOPIDOGREL BISULFATE 75 MG PO TABS: 75.0000 mg | ORAL_TABLET | Freq: Every day | ORAL | 11 refills | Status: DC

## 2018-03-23 MED ORDER — ROSUVASTATIN CALCIUM 20 MG PO TABS: 20.0000 mg | ORAL_TABLET | Freq: Every day | ORAL | 5 refills | Status: DC

## 2018-03-23 MED ORDER — AMLODIPINE BESYLATE 5 MG PO TABS: 5.0000 mg | ORAL_TABLET | Freq: Every day | ORAL | 5 refills | Status: DC

## 2018-03-23 MED ORDER — FLUOXETINE HCL 20 MG PO TABS: ORAL_TABLET | ORAL | 2 refills | Status: DC

## 2018-03-23 MED FILL — FAMOTIDINE 20 MG TABLET: 20 | 30 days supply | Qty: 60 | Fill #0

## 2018-03-23 MED FILL — ?FLUOXETINE HCL 20MG TABLET: 20 | 30 days supply | Qty: 30 | Fill #0

## 2018-03-23 MED FILL — ROSUVASTATIN CALCIUM 20 MG: 20 | 30 days supply | Qty: 30 | Fill #0

## 2018-03-23 MED FILL — CLOPIDOGREL 75 MG TABLET: 75 | 30 days supply | Qty: 30 | Fill #0

## 2018-03-23 NOTE — Patient Instructions (Addendum)
Remember to stop Eliquis. Start plavix. I also stopped the protonix and started famotidine.   Hipertensin Hypertension El trmino hipertensin es otra forma de denominar a la presin arterial elevada. La presin arterial elevada fuerza al corazn a trabajar ms para bombear la sangre. Esto puede causar problemas con el paso del [place]. Una lectura de presin arterial est compuesta por 2 nmeros. Hay un nmero superior (sistlico) sobre un nmero inferior (diastlico). Lo ideal es tener la presin arterial por debajo de 120/80. Las decisiones saludables pueden ayudarle a disminuir su presin arterial. Es posible que necesite medicamentos que le ayuden a disminuir su presin arterial si:  Su presin arterial no disminuye mediante decisiones saludables.  Su presin arterial est por encima de 130/80.  Siga estas instrucciones en su casa: Comida y bebida  Si se lo indican, siga el plan de alimentacin de DASH (Dietary Approaches to Stop Hypertension, Maneras de alimentarse para detener la hipertensin). Esta dieta incluye: ? Que la mitad del plato de cada comida sea de frutas y verduras. ? Que un cuarto del plato de cada comida sea de cereales integrales. Los cereales integrales incluyen pasta integral, arroz integral y pan integral. ? Comer y beber productos lcteos con bajo contenido de [place], como leche descremada o yogur bajo en grasas. ? Que un cuarto del plato de cada comida sea de protenas bajas en grasa (magras). Las protenas bajas en grasa incluyen pescado, pollo sin piel, huevos, frijoles y tofu. ? Evitar consumir carne grasa, carne curada y procesada, o pollo con piel. ? Evitar consumir alimentos prehechos o procesados.  Consuma menos de 1500 mg de sal (sodio) por da.  Limite el consumo de alcohol a no ms de 1 medida por da si es mujer y no est Orthoptistembarazada y a 2 medidas por da si es hombre. Una medida equivale a 12onzas de cerveza, 5onzas de vino o 1onzas de bebidas  alcohlicas de alta graduacin. Estilo de vida  Trabaje con su mdico para mantenerse en un peso saludable o para perder peso. Pregntele a su mdico cul es el peso recomendable para usted.  Realice al menos 30 minutos de ejercicio que haga que se acelere su corazn (ejercicio Magazine features editoraerbico) la DIRECTVmayora de los das de la [place]. Estos pueden incluir caminar, nadar o andar en bicicleta.  Realice al menos 30 minutos de ejercicio que fortalezca sus msculos (ejercicios de resistencia) al menos 3 das a la [place]. Estos pueden incluir levantar pesas o hacer pilates.  No consuma ningn producto que contenga nicotina o tabaco. Esto incluye cigarrillos y cigarrillos electrnicos. Si necesita ayuda para dejar de fumar, consulte al American Expressmdico.  Controle su presin arterial en su casa tal como le indic el mdico.  Concurra a todas las visitas de control como se lo haya indicado el mdico. Esto es importante. Medicamentos  Baxter Internationalome los medicamentos de venta libre y los recetados solamente como se lo haya indicado el mdico. Siga cuidadosamente las indicaciones.  No omita las dosis de medicamentos para la presin arterial. Los medicamentos pierden eficacia si omite dosis. El hecho de omitir las dosis tambin [place]a el riesgo de otros problemas.  Pregntele a su mdico a qu efectos secundarios o reacciones a los Museum/gallery curatormedicamentos debe prestar atencin. Comunquese con un mdico si:  Piensa que tiene [place] reaccin a los medicamentos que est tomando.  Tiene dolores de cabeza frecuentes (recurrentes).  Siente mareos.  Tiene hinchazn en los tobillos.  Tiene problemas de visin. Solicite ayuda de inmediato si:  Freight forwarderiente un dolor  de cabeza muy intenso.  Comienza a sentirse confundido.  Se siente dbil o adormecido.  Siente que va a desmayarse.  Siente un dolor muy intenso en: ? El pecho. ? El vientre (abdomen).  Devuelve (vomita) ms de una vez.  Tiene dificultad para respirar. Resumen  El trmino  hipertensin es otra forma de denominar a la presin arterial elevada.  Las decisiones saludables pueden ayudarle a disminuir su presin arterial. Si no puede controlar su presin arterial mediante decisiones saludables, es posible que deba tomar medicamentos. Esta informacin no tiene Theme park manager el consejo del mdico. Asegrese de hacerle al mdico cualquier pregunta que tenga. Document Released: 09/23/2009 Document Revised: 03/17/2016 Document Reviewed: 03/17/2016 Elsevier Interactive Patient Education  [employer].

## 2018-03-23 NOTE — Progress Notes (Signed)
Established Patient Office Visit  Subjective:  Patient ID: Mark Robles, male    DOB: 1951-02-10  Age: 67 y.o. MRN: [id number]  CC:  Chief Complaint  Patient presents with  . Follow-up    chronic condition   . Dizziness    HPI Ry Moody presents for follow up on chronic conditions.  Patient presents with wife who is the main historian and Spanish interpreter. Wife reports that he stopped the Prozac due to not having any more refills.  Wife reports that patient was doing much better when he was taking Prozac.  They report that the patient is not taking Plavix.  He is taking Eliquis.  At one point he was taking both.  Neurology recommended patient take Plavix for stroke prevention.  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  He does ambulate with a 1 point cane.  Neurology instructed patient that his dizziness can be coming from his gait instability.  Patient stopped taking his allergy medication.  He was unaware that he had refills on the medication.  Wife states that when he was on Flonase he was doing better with his dizziness and he has allergic shiners resolved. Patient needs refills on medications denies chest pain shortness of breath dizziness or leg swelling.  Past Medical History:  Diagnosis Date  . Depression   . DVT (deep venous thrombosis) (HCC)   . Hyperlipidemia   . Left-sided sensory deficit present   . Seizure (HCC)   . Stroke [facility])     Past Surgical History:  Procedure Laterality Date  . NO PAST SURGERIES      Family History  Problem Relation Age of Onset  . Hypertension Mother        tachycardia, pacemaker  . Hypertension Sister   . Hypertension Brother   . Hypertension Maternal Uncle     Social History   Socioeconomic History  . Marital status: Married    Spouse name: Not on file  . Number of children: 3  . Years of education: 6th grade  . Highest  education level: Not on file  Occupational History  . Not on file  Social Needs  . Financial resource strain: Not on file  . Food insecurity:    Worry: Not on file    Inability: Not on file  . Transportation needs:    Medical: Not on file    Non-medical: Not on file  Tobacco Use  . Smoking status: Never Smoker  . Smokeless tobacco: Never Used  Substance and Sexual Activity  . Alcohol use: No    Frequency: Never  . Drug use: No  . Sexual activity: Not on file  Lifestyle  . Physical activity:    Days per week: Not on file    Minutes per session: Not on file  . Stress: Not on file  Relationships  . Social connections:    Talks on phone: Not on file    Gets together: Not on file    Attends religious service: Not on file    Active member of club or organization: Not on file    Attends meetings of clubs or organizations: Not on file    Relationship status: Not on file  . Intimate partner violence:    Fear of current or ex partner: Not on file    Emotionally abused: Not on file    Physically abused: Not on file    Forced sexual  activity: Not on file  Other Topics Concern  . Not on file  Social History Narrative   Lives at home with wife & 1 child   Right handed   No caffeine     Outpatient Medications Prior to Visit  Medication Sig Dispense Refill  . fluticasone (FLONASE) 50 MCG/ACT nasal spray Place 2 sprays into both nostrils daily. 16 g 6  . loperamide (IMODIUM) 2 MG capsule TAKE 2 CAPSULES BY MOUTH INITIALLY THEN 1 CAPSULE AFTER EACH LOOSE STOOL (DO NOT EXCEED 8 CAPSULES IN 24 HOURS)  0  . meclizine (ANTIVERT) 25 MG tablet TAKE 1 TABLET BY MOUTH 3 TIMES DAILY AS NEEDED FOR DIZZINESS. 60 tablet 2  . amLODipine (NORVASC) 5 MG tablet Take 1 tablet (5 mg total) by mouth daily. 30 tablet 5  . clopidogrel (PLAVIX) 75 MG tablet Take 1 tablet (75 mg total) by mouth daily. 30 tablet 11  . ELIQUIS 5 MG TABS tablet TAKE 1 TABLET BY MOUTH 2 TIMES DAILY. 60 tablet 2  . FLUoxetine  (PROZAC) 20 MG tablet TAKE 1 TABLET BY MOUTH DAILY IN THE MORNING 30 tablet 0  . hydrOXYzine (ATARAX/VISTARIL) 10 MG tablet TAKE 1 TABLET BY MOUTH EVERY 8 HOURS AS NEEDED. 60 tablet 1  . pantoprazole (PROTONIX) 40 MG tablet TAKE 1 TABLET BY MOUTH ONCE DAILY 30 tablet 4  . rosuvastatin (CRESTOR) 20 MG tablet Take 1 tablet (20 mg total) by mouth daily. 30 tablet 3   No facility-administered medications prior to visit.     No Known Allergies  ROS Review of Systems  Constitutional: Negative.   HENT: Negative.   Eyes: Negative.   Respiratory: Negative.   Cardiovascular: Negative.   Gastrointestinal: Negative.   Endocrine: Negative.   Genitourinary: Negative.   Musculoskeletal: Negative.   Skin: Negative.   Allergic/Immunologic: Negative.   Neurological: Positive for weakness (Left-sided) and light-headedness.  Hematological: Negative.   Psychiatric/Behavioral: Negative.       Objective:    Physical Exam  Constitutional: He is oriented to person, place, and time. He appears well-developed and well-nourished. No distress.  HENT:  Head: Normocephalic and atraumatic.  Eyes: Pupils are equal, round, and reactive to light. Conjunctivae and EOM are normal.  Neck: Normal range of motion.  Cardiovascular: Normal rate, regular rhythm and normal heart sounds.  Pulmonary/Chest: Effort normal and breath sounds normal. No respiratory distress.  Musculoskeletal: Normal range of motion.  Neurological: He is alert and oriented to person, place, and time. Gait abnormal.  Ambulating with one point cane.  Displays weakness on the left side upper and lower extremity.  Skin: Skin is warm and dry.  Psychiatric: He has a normal mood and affect. His behavior is normal. Judgment and thought content normal.  Nursing note and vitals reviewed.   BP (!) 142/90 (BP Location: Right Arm, Patient Position: Sitting, Cuff Size: Small)   Pulse 66   Temp 98.7 F (37.1 C) (Oral)   Ht 5\' 5"  (1.651 m)   Wt 174  lb (78.9 kg)   SpO2 99%   BMI 28.96 kg/m  Wt Readings from Last 3 Encounters:  03/23/18 174 lb (78.9 kg)  10/31/17 179 lb 12.8 oz (81.6 kg)  10/26/17 180 lb (81.6 kg)     Health Maintenance Due  Topic Date Due  . Hepatitis C Screening  [birth year]  . HIV Screening  01/18/1968  . COLONOSCOPY  01/18/2003    There are no preventive care reminders to display for this patient.  No results  found for: TSH No results found for: WBC, HGB, HCT, MCV, PLT Lab Results  Component Value Date   NA 141 08/15/2017   K 4.5 08/15/2017   CO2 28 08/15/2017   GLUCOSE 77 08/15/2017   BUN 11 08/15/2017   CREATININE 0.90 08/15/2017   BILITOT 0.3 05/16/2017   ALKPHOS 43 05/16/2017   AST 24 05/16/2017   ALT 26 05/16/2017   PROT 6.8 05/16/2017   ALBUMIN 4.4 05/16/2017   CALCIUM 10.0 08/15/2017   Lab Results  Component Value Date   CHOL 132 09/19/2017   Lab Results  Component Value Date   HDL 50 09/19/2017   Lab Results  Component Value Date   LDLCALC 63 09/19/2017   Lab Results  Component Value Date   TRIG 96 09/19/2017   Lab Results  Component Value Date   CHOLHDL 2.6 09/19/2017   Lab Results  Component Value Date   HGBA1C 5.4 05/16/2017      Assessment & Plan:   Problem List Items Addressed This Visit      Cardiovascular and Mediastinum   Cerebrovascular accident (CVA) (HCC)   Relevant Medications   clopidogrel (PLAVIX) 75 MG tablet   rosuvastatin (CRESTOR) 20 MG tablet   amLODipine (NORVASC) 5 MG tablet   Other Relevant Orders   Ambulatory referral to Physical Therapy   Essential hypertension - Primary   Relevant Medications   rosuvastatin (CRESTOR) 20 MG tablet   amLODipine (NORVASC) 5 MG tablet   Other Relevant Orders   POCT urinalysis dipstick (Completed)     Nervous and Auditory   Spastic hemiplegia (HCC)     Other   Dyslipidemia   Relevant Medications   rosuvastatin (CRESTOR) 20 MG tablet   Language barrier to communication   Current mild episode  of major depressive disorder without prior episode (HCC)   Relevant Medications   FLUoxetine (PROZAC) 20 MG tablet    Other Visit Diagnoses    Gastroesophageal reflux disease without esophagitis       Relevant Medications   famotidine (PEPCID) 20 MG tablet      Meds ordered this encounter  Medications  . FLUoxetine (PROZAC) 20 MG tablet    Sig: TAKE 1 TABLET BY MOUTH DAILY IN THE MORNING    Dispense:  30 tablet    Refill:  2  . clopidogrel (PLAVIX) 75 MG tablet    Sig: Take 1 tablet (75 mg total) by mouth daily.    Dispense:  30 tablet    Refill:  11  . rosuvastatin (CRESTOR) 20 MG tablet    Sig: Take 1 tablet (20 mg total) by mouth daily.    Dispense:  30 tablet    Refill:  5  . amLODipine (NORVASC) 5 MG tablet    Sig: Take 1 tablet (5 mg total) by mouth daily.    Dispense:  30 tablet    Refill:  5  . famotidine (PEPCID) 20 MG tablet    Sig: Take 1 tablet (20 mg total) by mouth 2 (two) times daily.    Dispense:  60 tablet    Refill:  2  Discussed taking medications daily as prescribed.  Reiterated to call the pharmacy when they run out of medications at home to see if refills are available.  Discontinue pantoprazole and start famotidine twice a day.  Ordered physical therapy for further assistance witht left-sided hemiparalysis.  Patient states that he feels if he can do more his depression will decrease.  Instructed patient to restart Prozac  and follow-up in 4 weeks.  Follow-up: Return in about 4 weeks (around 04/20/2018).    Mike GipAndre Marykathleen Russi, FNP

## 2018-03-30 ENCOUNTER — Other Ambulatory Visit: Payer: Self-pay | Admitting: Family Medicine

## 2018-03-30 DIAGNOSIS — R42 Dizziness and giddiness: Secondary | ICD-10-CM

## 2018-03-30 MED FILL — CLOPIDOGREL 75 MG TABLET: 75 | 30 days supply | Qty: 30 | Fill #9

## 2018-03-30 MED FILL — ?PANTOPRAZOLE SO DR 40MG TA: 40 | 30 days supply | Qty: 30 | Fill #4

## 2018-03-30 MED FILL — ?MECLIZINE HCL 25MG TABS: 25 | 20 days supply | Qty: 60 | Fill #0

## 2018-03-31 MED FILL — FLUTICASONE PROP 50 MCG SPR: 50 | 30 days supply | Qty: 16 | Fill #2

## 2018-04-10 MED FILL — ?AMLODIPINE BESYLATE 5 MG T: 5 | 30 days supply | Qty: 30 | Fill #0

## 2018-04-20 ENCOUNTER — Encounter: Payer: Self-pay | Admitting: Family Medicine

## 2018-04-20 ENCOUNTER — Ambulatory Visit (INDEPENDENT_AMBULATORY_CARE_PROVIDER_SITE_OTHER): Payer: Self-pay | Admitting: Family Medicine

## 2018-04-20 VITALS — BP 136/84 | HR 65 | Temp 98.1°F | Ht 65.0 in | Wt 175.0 lb

## 2018-04-20 DIAGNOSIS — I69351 Hemiplegia and hemiparesis following cerebral infarction affecting right dominant side: Secondary | ICD-10-CM

## 2018-04-20 DIAGNOSIS — F320 Major depressive disorder, single episode, mild: Secondary | ICD-10-CM

## 2018-04-20 DIAGNOSIS — G4700 Insomnia, unspecified: Secondary | ICD-10-CM | POA: Insufficient documentation

## 2018-04-20 DIAGNOSIS — I10 Essential (primary) hypertension: Secondary | ICD-10-CM

## 2018-04-20 DIAGNOSIS — Z789 Other specified health status: Secondary | ICD-10-CM

## 2018-04-20 LAB — POCT URINALYSIS DIP (MANUAL ENTRY)
Bilirubin, UA: NEGATIVE
Glucose, UA: NEGATIVE mg/dL
Ketones, POC UA: NEGATIVE mg/dL
Leukocytes, UA: NEGATIVE
Nitrite, UA: NEGATIVE
Protein Ur, POC: NEGATIVE mg/dL
Spec Grav, UA: 1.015 (ref 1.010–1.025)
Urobilinogen, UA: 0.2 E.U./dL
pH, UA: 6.5 (ref 5.0–8.0)

## 2018-04-20 MED ORDER — HYDROXYZINE HCL 10 MG PO TABS: 10.0000 mg | ORAL_TABLET | Freq: Three times a day (TID) | ORAL | 2 refills | Status: DC | PRN

## 2018-04-20 MED ORDER — TRAZODONE HCL 50 MG PO TABS: 25.0000 mg | ORAL_TABLET | Freq: Every evening | ORAL | 3 refills | Status: DC | PRN

## 2018-04-20 MED FILL — ?TRAZODONE HCL 50MG TABS: 50 | 30 days supply | Qty: 30 | Fill #0

## 2018-04-20 MED FILL — hydrOXYzine HCL 10 MG TABS: 10 | 10 days supply | Qty: 30 | Fill #0

## 2018-04-20 NOTE — Progress Notes (Signed)
[facility]   Progress Note: General Provider: Mike Gip, FNP  SUBJECTIVE:   Mark Robles is a 68 y.o. male who  has a past medical history of Depression, DVT (deep venous thrombosis) (HCC), Hyperlipidemia, Left-sided sensory deficit present, Seizure (HCC), and Stroke (HCC).. Patient presents today for Follow-up (HTN,dizzy) Patient instructed to start Plavix and famotidine. Patient states that he is compliant with medications. Patient states that he has light headedness that increases with looking down.  Wife reports that his mood is better since restarting Prozac.Patient reports that he continues to have anxiety about eating and he states that he is eating quickly so that he does not get dizzy with looking down.   Denies suicidal ideations, intent or plan.   Review of Systems  Constitutional: Negative.   HENT: Negative.   Eyes: Negative.   Respiratory: Negative.   Cardiovascular: Negative.   Gastrointestinal: Negative.   Genitourinary: Negative.   Musculoskeletal: Negative.   Skin: Negative.   Neurological: Positive for dizziness and weakness.  Psychiatric/Behavioral: The patient is nervous/anxious.      OBJECTIVE: BP 136/84 (BP Location: Right Arm, Patient Position: Sitting, Cuff Size: Small)   Pulse 65   Temp 98.1 F (36.7 C) (Oral)   Ht 5\' 5"  (1.651 m)   Wt 175 lb (79.4 kg)   SpO2 99%   BMI 29.12 kg/m   Wt Readings from Last 3 Encounters:  04/20/18 175 lb (79.4 kg)  03/23/18 174 lb (78.9 kg)  10/31/17 179 lb 12.8 oz (81.6 kg)     Physical Exam Vitals signs and nursing note reviewed.  Constitutional:      General: He is not in acute distress.    Appearance: He is well-developed.  HENT:     Head: Normocephalic and atraumatic.  Eyes:     Conjunctiva/sclera: Conjunctivae normal.     Pupils: Pupils are equal, round, and reactive to light.  Neck:     Musculoskeletal: Normal range of motion.    Cardiovascular:     Rate and Rhythm: Normal rate and regular rhythm.     Heart sounds: Normal heart sounds.  Pulmonary:     Effort: Pulmonary effort is normal. No respiratory distress.     Breath sounds: Normal breath sounds.  Musculoskeletal: Normal range of motion.  Skin:    General: Skin is warm and dry.  Neurological:     Mental Status: He is alert and oriented to person, place, and time.     Gait: Gait abnormal.     Comments: Ambulating with one point cane.  Displays weakness on the left side upper and lower extremity.  Psychiatric:        Behavior: Behavior normal.        Thought Content: Thought content normal.        Judgment: Judgment normal.     ASSESSMENT/PLAN: 1. Essential hypertension The current medical regimen is effective;  continue present plan and medications.  - POCT urinalysis dipstick  2. Insomnia, unspecified type Start trazodone. Discussed sleep hygiene.  - traZODone (DESYREL) 50 MG tablet; Take 0.5-1 tablets (25-50 mg total) by mouth at bedtime as needed for sleep.  Dispense: 30 tablet; Refill: 3  3. Spastic hemiplegia of right dominant side as late effect of cerebral infarction [facility]) Physical therapy pending.   4. Current mild episode of major depressive disorder without prior episode (HCC) Continue with Prozac.  - hydrOXYzine (ATARAX/VISTARIL) 10 MG tablet; Take 1 tablet (10 mg total) by  mouth 3 (three) times daily as needed.  Dispense: 30 tablet; Refill: 2  5. Language barrier to communication.  Interpreter used throughout encounter.   Return in about 8 weeks (around 06/15/2018).    The patient was given clear instructions to go to ER or return to medical center if symptoms do not improve, worsen or new problems develop. The patient verbalized understanding and agreed with plan of care.   Ms. [name]. Riley Lam, FNP-BC [facility] [address] [place], [place] [address] [phone]

## 2018-04-20 NOTE — Patient Instructions (Addendum)
Trazodone tablets Qu es este medicamento? La TRAZODONA se utiliza para tratar la depresin. Este medicamento puede ser utilizado para otros usos; si tiene alguna pregunta consulte con su proveedor de atencin mdica o con su farmacutico. MARCAS COMUNES: Desyrel Qu le debo informar a mi profesional de la salud antes de tomar este medicamento? Necesita saber si usted presenta alguno de los siguientes problemas o situaciones: -intento de suicidio o con ideas suicidas -trastorno bipolar -problemas sanguneos -glaucoma -enfermedad cardiaca o ataque cardiaco previo -latidos cardiacos irregulares -enfermedad renal o heptica -niveles bajos de sodio en la sangre -una reaccin alrgica o inusual a la trazodona, a otros medicamentos, alimentos, colorantes o conservantes -si est embarazada o buscando quedar embarazada -si est amamantando a un beb Cmo debo utilizar este medicamento? Tome este medicamento por va oral con un vaso de agua. Siga las instrucciones de la etiqueta del Mariemedicamento. L-3 Communicationsome este medicamento poco tiempo despus de una comida o un refrigerio ligero. Tome su medicamento a intervalos regulares. No tome su medicamento con una frecuencia mayor a la indicada. No deje de tomar PPL Corporationeste medicamento de repente a menos que as lo indique su mdico. Dejar de Visual merchandiserutilizar este medicamento demasiado rpido puede causar efectos secundarios graves o podra empeorar su afeccin. Su farmacutico le dar una Gua del medicamento especial (MedGuide, nombre en ingls) con cada receta y en cada ocasin que la vuelva a surtir. Asegrese de leer esta informacin cada vez cuidadosamente. Hable con su pediatra para informarse acerca del uso de este medicamento en nios. Puede requerir atencin especial. Sobredosis: Pngase en contacto inmediatamente con un centro toxicolgico o una sala de urgencia si usted cree que haya tomado demasiado medicamento. ATENCIN: Reynolds AmericanEste medicamento es solo para usted. No  comparta este medicamento con nadie. Qu sucede si me olvido de una dosis? Si olvida una dosis, tmela lo antes posible. Si es casi la hora de la prxima dosis, tome slo esa dosis. No tome dosis adicionales o dobles. Qu puede interactuar con este medicamento? No use este medicamento con ninguno de los siguientes frmacos: ciertos medicamentos para infecciones micticas, tales como fluconazol, itraconazol, ketoconazol, posaconazol y voriconazol cisaprida dofetilida dronedarona linezolida IMAO, tales como Carbex, Eldepryl, Marplan, Nardil y Parnate mesoridazina azul de metileno (inyectado en una vena) pimozida saquinavir tioridazina Este medicamento tambin puede interactuar con los siguientes frmacos: alcohol medicamentos antivirales para el VIH o SIDA aspirina y medicamentos tipo aspirina barbitricos, tales como fenobarbital ciertos medicamentos para la presin sangunea, enfermedad cardiaca y ritmo cardiaco irregular ciertos medicamentos para la depresin, ansiedad o trastornos psicticos ciertos medicamentos para la migraa, tales como almotriptn, eletriptn, frovatriptn, naratriptn, rizatriptn, sumatriptn y zolmitriptn ciertos medicamentos para convulsiones, tales como carbamazepina y fenitona ciertos medicamentos para conciliar el sueo ciertos medicamentos que tratan o previenen cogulos sanguneos, tales como dalteparina, enoxaparina y warfarina digoxina fentanilo litio AINE, medicamentos para Chief Technology Officerel dolor y la inflamacin, tales como ibuprofeno o naproxeno otros medicamentos que prolongan el intervalo QT (causan un ritmo cardiaco anormal) rasagilina suplementos tales como hierba de [place], kava kava y valeriana tramadol triptfano Puede ser que esta lista no menciona todas las posibles interacciones. Informe a su profesional de Beazer Homesla salud de Ingram Micro Inctodos los productos a base de hierbas, medicamentos de [place]lorsvilleventa libre o suplementos nutritivos que est tomando. Si usted fuma, consume bebidas alcohlicas  o si utiliza drogas ilegales, indqueselo tambin a su profesional de Beazer Homesla salud. Algunas sustancias pueden interactuar con su medicamento. A qu debo estar atento al usar PPL Corporationeste medicamento? Informe a su mdico si sus  sntomas no mejoran o si empeoran. Visite a su mdico o a su profesional de la salud para chequear su evolucin peridicamente. Debido que puede ser necesario tomar este medicamento durante varias semanas para que sea posible observar sus efectos en forma [place], es importante que sigue su tratamiento como recetado por su mdico. Los pacientes y sus familias deben estar atentos si empeora la depresin o ideas suicidas. Tambin est atento a cambios repentinos o severos de emocin, tales como el sentirse ansioso, agitado, lleno de pnico, irritable, hostil, agresivo, impulsivo, inquietud severa, demasiado excitado y hiperactivo o dificultad para conciliar el sueo. Si esto ocurre, especialmente al comenzar con el tratamiento o al cambiar de dosis, comunquese con su profesional de Beazer Homes. Puede experimentar somnolencia, mareos o visin borrosa. No conduzca ni utilice maquinaria, ni haga nada que Scientist, research (life sciences) en estado de alerta hasta que sepa cmo le afecta este medicamento. No se siente ni se ponga de pie con rapidez, especialmente si es un paciente de edad avanzada. Esto reduce el riesgo de mareos o Newell Rubbermaid. El alcohol puede interferir con el efecto de [place]. Evite consumir bebidas alcohlicas. Este medicamento puede provocar sequedad de los ojos y visin borrosa. Su [place] lentes de contacto, puede sentir [facility]. Las gotas lubricantes pueden ayudarle. Si el problema no desaparece o es severo, visite a su mdico de ojos. Este medicamento puede secarle la boca. El Product manager chicle sin azcar, chupar caramelos duros y tomar agua en abundancia le ayudarn a mantener la boca hmeda. Si el problema no desaparece o es severo, consulte a su mdico. Qu efectos secundarios  puedo tener al [employer] Scientific este medicamento? Efectos secundarios que debe informar a su mdico o a Producer, television/film/video de la salud tan pronto como sea posible: Therapist, art, como erupcin cutnea, comezn/picazn o urticarias, e hinchazn de la cara, los labios o la lengua estado de nimo elevado, menor necesidad de dormir, pensamientos acelerados, conducta impulsiva confusin ritmo cardiaco rpido, irregular sensacin de desmayos o aturdimiento, cadas sensacin de agitacin, enojo o irritabilidad prdida de equilibrio o coordinacin ereccin dolorosa o prolongada inquietud, caminar de un lado a otro, incapacidad para quedarse quieto ideas suicidas u otros cambios en el estado de nimo temblores dificultad para conciliar el sueo convulsiones sangrado o moretones inusuales Efectos secundarios que generalmente no requieren atencin mdica (infrmelos a su mdico o a Producer, television/film/video de la salud si persisten o si son molestos): cambios en el deseo o desempeo sexual cambios en el apetito o el peso estreimiento dolor de cabeza dolores musculares nuseas Puede ser que esta lista no menciona todos los posibles efectos secundarios. Comunquese a su mdico por asesoramiento mdico Hewlett-Packard. Usted puede informar los efectos secundarios a la FDA por telfono al 1-800-FDA-1088. Dnde debo guardar mi medicina? Mantngala fuera del alcance de los nios. Gurdela a Sanmina-SCI, entre 15 y 30 grados C (71 y 26 grados F). Protjala de la luz. Mantenga el envase bien cerrado. Deseche los medicamentos que no haya utilizado, despus de la fecha de vencimiento. ATENCIN: Este folleto es un resumen. Puede ser que no cubra toda la posible informacin. Si usted tiene preguntas acerca de esta medicina, consulte con su mdico, su farmacutico o su profesional de Radiographer, therapeutic.  2019 Elsevier/Gold Standard (2017-08-16 00:00:00) Hipertensin Hypertension Introduccin La hipertensin, conocida  comnmente como presin arterial alta, se produce cuando la sangre bombea en las arterias con mucha fuerza. Las arterias son los vasos sanguneos que transportan la sangre desde el corazn  al resto del cuerpo. La hipertensin hace que el corazn haga ms esfuerzo para Insurance account manager y Sears Holdings Corporation que las arterias se Armed forces training and education officer o Multimedia programmer. La hipertensin no tratada o no controlada puede causar infarto de miocardio, accidentes cerebrovasculares, enfermedad renal y otros problemas. Una lectura de la presin arterial consiste de un nmero ms alto sobre un nmero ms bajo. En condiciones ideales, la presin arterial debe estar por debajo de 120/80. El primer nmero ("superior") es la presin sistlica. Es la medida de la presin de las arterias cuando el corazn late. El segundo nmero ("inferior") es la presin diastlica. Es la medida de la presin en las arterias cuando el corazn se relaja. Cules son las causas? Se desconoce la causa de esta afeccin. Qu incrementa el riesgo? Algunos factores de riesgo de hipertensin estn bajo su control. Otros no. Factores que puede Omnicom.  Tener diabetes mellitus tipo 2, colesterol alto, o ambos.  No hacer la cantidad suficiente de actividad fsica o ejercicio.  Tener sobrepeso.  Consumir mucha grasa, azcar, caloras o sal (sodio) en su dieta.  Beber alcohol en exceso. Factores que son difciles o imposibles de modificar  Tener enfermedad renal crnica.  Tener antecedentes familiares de presin arterial alta.  La edad. Los riesgos aumentan con la edad.  La raza. El riesgo es mayor para las Statistician.  El sexo. Antes de los 45aos, los hombres corren ms [employer]. Despus de los 65aos, las mujeres corren ms Lexmark International.  Tener apnea obstructiva del sueo.  El estrs. Cules son los signos o los sntomas? La presin arterial extremadamente alta (crisis hipertensiva) puede  provocar:  Dolor de [place].  Ansiedad.  Falta de aire.  Hemorragia nasal.  Nuseas y vmitos.  Dolor de pecho intenso.  Una crisis de movimientos que no puede controlar (convulsiones). Cmo se diagnostica? Esta afeccin se diagnostica midiendo su presin arterial mientras se encuentra sentado, con el brazo apoyado sobre una superficie. El brazalete del tensimetro debe colocarse directamente sobre la piel de la parte superior del brazo y al nivel de su corazn. Debe medirla al [facility] veces en el mismo brazo. Determinadas condiciones pueden causar una diferencia de presin arterial entre el brazo izquierdo y Aeronautical engineer. Ciertos factores pueden provocar que las lecturas de la presin arterial sean inferiores o superiores a lo normal (elevadas) por un perodo corto de tiempo:  Si su presin arterial es ms alta cuando se encuentra en el consultorio del mdico que cuando la mide en su hogar, se denomina "hipertensin de bata blanca". La Harley-Davidson de las personas que tienen esta afeccin no deben ser Engelhard Corporation.  Si su presin arterial es ms alta en el hogar que cuando se encuentra en el consultorio del mdico, se denomina "hipertensin enmascarada". La Harley-Davidson de las personas que tienen esta afeccin deben ser medicadas para Chief Operating Officer la presin arterial. Si tiene una lecturas de presin arterial alta durante una visita o si tiene presin arterial normal con otros factores de riesgo:  Es posible que se le pida que regrese Banker para volver a Chief Operating Officer su presin arterial.  Se le puede pedir que se controle la presin arterial en su casa durante 1 semana o ms. Si se le diagnostica hipertensin, es posible que se le realicen otros anlisis de sangre o estudios de diagnstico por imgenes para ayudar a su mdico a comprender su riesgo general de tener otras afecciones. Cmo se trata? Esta afeccin se trata haciendo cambios saludables en  el estilo de vida, tales como ingerir alimentos  saludables, realizar ms ejercicio y reducir el consumo de alcohol. El mdico puede recetarle medicamentos si los cambios en el estilo de vida no son suficientes para Museum/gallery curatorlograr controlar la presin arterial y si:  Su presin arterial sistlica est por encima de 130.  Su presin arterial diastlica est por encima de 80. La presin arterial deseada puede variar en funcin de las enfermedades, la edad y otros factores personales. Siga estas instrucciones en su casa: Comida y bebida   Siga una dieta con alto contenido de fibras y Haigler Creekpotasio, y con bajo contenido de sodio, International aid/development workerazcar agregada y Neurosurgeongrasas. Un ejemplo de plan alimenticio es la dieta DASH (Dietary Approaches to Stop Hypertension, Mtodos alimenticios para detener la hipertensin). Para alimentarse de esta manera: ? Coma mucha fruta y verdura fresca. Trate de que la mitad del plato de cada comida sea de frutas y verduras. ? Coma cereales integrales, como pasta integral, arroz integral y pan integral. Llene aproximadamente un cuarto del plato con cereales integrales. ? Coma y beba productos lcteos con bajo contenido de grasa, como leche descremada o yogur bajo en grasas. ? Evite la ingesta de cortes de carne grasa, carne procesada o curada, y carne de ave con piel. Llene aproximadamente un cuarto del plato con protenas magras, como pescado, pollo sin piel, frijoles, huevos y tofu. ? Evite ingerir alimentos prehechos o procesados. En general, estos tienen mayor cantidad de sodio, azcar agregada y Steffanie Rainwatergrasa.  Reduzca su ingesta diaria de sodio. La mayora de las personas que tienen hipertensin deben comer menos de 1500 mg de sodio por C.H. Robinson Worldwideda.  Limite el consumo de alcohol a no ms de 1 medida por da si es mujer y no est Orthoptistembarazada y a 2 medidas por da si es hombre. Una medida equivale a 12onzas de cerveza, 5onzas de vino o 1onzas de bebidas alcohlicas de alta graduacin. Estilo de vida  Trabaje con su mdico para mantener un peso saludable o  Curatorperder peso. Pregntele cual es su peso recomendado.  Realice al menos 30 minutos de ejercicio que haga que se acelere su corazn (ejercicio Magazine features editoraerbico) la DIRECTVmayora de los das de la Dekorrasemana. Estas actividades pueden incluir caminar, nadar o andar en bicicleta.  Incluya ejercicios para fortalecer sus msculos (ejercicios de resistencia), como pilates o levantamiento de pesas, como parte de su rutina semanal de ejercicios. Intente realizar 30minutos de este tipo de ejercicios al Kelloggmenos tres das a la Pagesemana.  No consuma ningn producto que contenga nicotina o tabaco, como cigarrillos y Administrator, Civil Servicecigarrillos electrnicos. Si necesita ayuda para dejar de fumar, consulte al mdico.  Contrlese la presin arterial en su casa segn las indicaciones del mdico.  Concurra a todas las visitas de control como se lo haya indicado el mdico. Esto es importante. Medicamentos  Baxter Internationalome los medicamentos de venta libre y los recetados solamente como se lo haya indicado el mdico. Siga cuidadosamente las indicaciones. Los medicamentos para la presin arterial deben tomarse segn las indicaciones.  No omita las dosis de medicamentos para la presin arterial. Si lo hace, estar en riesgo de tener problemas y puede hacer que los medicamentos sean menos eficaces.  Pregntele a su mdico a qu efectos secundarios o reacciones a los Museum/gallery curatormedicamentos debe prestar atencin. Comunquese con un mdico si:  Piensa que tiene una reaccin a un medicamento que est tomando.  Tiene dolores de cabeza frecuentes (recurrentes).  Siente mareos.  Tiene hinchazn en los tobillos.  Tiene problemas de visin. Solicite Jacobs Engineeringayuda  de inmediato si:  Siente un dolor de cabeza intenso o confusin.  Siente debilidad inusual o adormecimiento.  Siente que va a desmayarse.  Siente un dolor intenso en el pecho o el abdomen.  Vomita repetidas veces.  Tiene dificultad para respirar. Resumen  La hipertensin se produce cuando la sangre bombea en las  arterias con mucha fuerza. Si esta afeccin no se controla, podra correr riesgo de tener complicaciones graves.  La presin arterial deseada puede variar en funcin de las enfermedades, la edad y otros factores personales. Para la [employer], una presin arterial normal es menor que 120/80.  La hipertensin se trata con cambios en el estilo de vida, medicamentos o una combinacin de [place]. Los Danaher Corporation estilo de vida incluyen prdida de peso, ingerir alimentos sanos, seguir una dieta baja en sodio, hacer ms ejercicio y Glass blower/designer consumo de alcohol. Esta informacin no tiene Theme park manager el consejo del mdico. Asegrese de hacerle al mdico cualquier pregunta que tenga. Document Released: 04/05/2005 Document Revised: 03/17/2016 Document Reviewed: 03/17/2016 Elsevier Interactive Patient Education  2019 ArvinMeritor.

## 2018-04-21 MED FILL — ?FLUOXETINE HCL 20MG TABLET: 20 | 30 days supply | Qty: 30 | Fill #1

## 2018-04-21 MED FILL — ROSUVASTATIN CALCIUM 20 MG: 20 | 30 days supply | Qty: 30 | Fill #1

## 2018-04-24 MED FILL — CLOPIDOGREL 75 MG TABLET: 75 | 30 days supply | Qty: 30 | Fill #10

## 2018-05-01 ENCOUNTER — Ambulatory Visit (INDEPENDENT_AMBULATORY_CARE_PROVIDER_SITE_OTHER): Payer: Self-pay | Admitting: Neurology

## 2018-05-01 ENCOUNTER — Encounter: Payer: Self-pay | Admitting: Neurology

## 2018-05-01 VITALS — BP 120/78 | HR 85 | Ht 65.0 in | Wt 179.0 lb

## 2018-05-01 DIAGNOSIS — R42 Dizziness and giddiness: Secondary | ICD-10-CM

## 2018-05-01 NOTE — Patient Instructions (Signed)
I had a long d/w patient , his wife via spanish language interpreter about his recent stroke,spastic hemiplegia and gait imbalance, risk for recurrent stroke/TIAs, personally independently reviewed imaging studies and stroke evaluation results and answered questions.Continue Plavix  for secondary stroke prevention and maintain strict control of hypertension with blood pressure goal below 130/90, diabetes with hemoglobin A1c goal below 6.5% and lipids with LDL cholesterol goal below 70 mg/dL. I also advised the patient to eat a healthy diet with plenty of whole grains, cereals, fruits and vegetables, exercise regularly and maintain ideal body weight . We also talked about fall prevention precautions and I advised him to use his cane at all times.Followup in the future with me only as necessary and no scheduled appointment was made  Prevencin de cadas en el hogar, en adultos Fall Prevention in the Home, Adult Las cadas pueden causar lesiones y Audiological scientistafectar a Dealerpersonas de todas las edades. Hay muchas cosas simples que puede hacer para que su casa sea un lugar seguro y ayudar a prevenir las cadas. Pida ayuda cuando haga estos cambios, si la necesita. Qu medidas puedo tomar para prevenir cadas? Instrucciones generales  Use buena iluminacin en todas las habitaciones. Reemplace las bombillas que se hayan quemado.  Prenda las luces si est oscuro. Use luces nocturnas.  Coloque los AutoNationobjetos que [place]ausa con frecuencia en lugares de fcil acceso. Baje los estantes de toda la casa de ser necesario.  Disponga los muebles de modo de que haya espacio para caminar a su alrededor. Evite cambiar los BlueLinxmuebles de Environmental consultantlugar.  Quite las alfombras y todo lo que sea un riesgo de tropiezo.  Evite caminar sobre pisos mojados.  Arregle las superficies desparejas del piso.  Aada pintura o cinta de contraste de colores a las barras para sostn y los pasamanos en su casa. Coloque tiras de contraste de Higher education careers advisercolor en el primer y el  ltimo escaln de las escaleras.  Cuando use una escalera de Hoffmanmano, asegrese de que est abierta por completo y de que los lados estn bien asegurados. Pdale a alguien que sostenga la escalera mientras usted la est Crawfordusando. No suba a una escalera de mano cerrada.  Si tiene mascotas, fjese dnde estn. Qu puedo hacer en el bao?      Mantenga el piso seco. Seque de inmediato cualquier derrame de agua en el piso.  Elimine regularmente la acumulacin de jabn en la baera o la ducha.  Utilice alfombras o pegatinas antideslizantes en el piso de la baera o ducha.  Asegure las alfombras del bao con una cinta antideslizante doble faz para alfombras.  Si necesita sentarse mientras se ducha, use un banco plstico antideslizante.  Instale barras para sostn al lado del inodoro, en la baera y en la ducha. No use los toalleros como barras para sostn. Qu puedo hacer en el dormitorio?  Asegrese de tener una luz de fcil acceso al lado de la cama.  No use sbanas o mantas muy grandes que se amontonen Charles Schwabsobre el piso.  Tenga una silla firme con apoyabrazos para usar cuando se vista. Qu puedo hacer en la cocina?  Limpie de inmediato cualquier derrame.  Si necesita alcanzar algo que est alto, use una escalera firme con una barra de apoyo.  Mantenga los cables elctricos fuera del camino.  No use un pulidor o cera para pisos que dejen los pisos resbaladizos. Si debe usar cera, asegrese de que sea cera antideslizante para pisos. Qu puedo hacer en las escaleras?  No deje ningn objeto  en las escaleras.  Asegrese de tener un interruptor de luz en la parte superior e inferior de las escaleras. Si no lo tiene, instale uno.  Asegrese de que haya pasamanos en ambos lados de las escaleras. Repare los pasamanos que estn flojos o rotos. Asegrese de que los pasamanos tengan la misma longitud que la escalera.  Instale peldaos antideslizantes en todas las escaleras de su  casa.  Evite colocar alfombras en la parte superior o inferior de las escaleras, o asegure las alfombras con cinta adhesiva para alfombras a fin de evitar que se muevan.  Para la escalera, elija un diseo de alfombra que no oculte el borde de los Alcoa Incescalones.  Verifique que las alfombras estn bien adheridas a las escaleras. Arregle las alfombras flojas o gastadas. Qu puedo hacer en el exterior de mi casa?  Use una iluminacin brillante en el exterior.  Repare peridicamente los bordes de las aceras y las Avoncalzadas, as Valero Energycomo las grietas.  Retire los Johnson & Johnsonumbrales altos.  Recorte los arbustos en el camino principal de ingreso a Hotel managersu hogar.  Verifique con frecuencia que los pasamanos estn bien ajustados y en buen State Centerestado. Todas las escaleras deben tener pasamanos en ambos lados.  Instale barandillas de proteccin en los bordes de las terrazas o galeras elevadas.  Elimine los residuos y las cosas Eaton Corporationamontonadas en los pasillos, lo que incluye herramientas y piedras.  Limpie regularmente las hojas, la nieve y el hielo.  Utilice arena o sal en los pasillos durante los meses de invierno.  En el garaje, limpie de inmediato cualquier derrame, incluidos los derrames de grasa y aceite. Qu otras medidas puedo tomar?  Use calzado UGI Corporationcerrado en los dedos que le quede bien y Black & Deckerle amortige los pies. Use calzado con suela de goma o taco bajo.  Utilice dispositivos de movilidad, como bastones, andadores, patinetes con soporte para el pie y Shelbinamuletas.  Revise los medicamentos con el mdico. Algunos medicamentos pueden causar mareos o cambios en la presin arterial, lo que aumenta el riesgo de cadas. Hable con el mdico sobre 1050 Mcdonough Roadotras maneras de reducir el riesgo de cadas. Esto puede incluir trabajar con un fisioterapeuta o un entrenador para mejorar la fuerza, el equilibrio y Systems analystla resistencia. Dnde encontrar ms informacin  Centros para Air traffic controllerel Control y Psychiatristla Prevencin de Child psychotherapistnfermedades (Centers for Disease Control and  Prevention), STEADI: TVDivision.uyhttps://www.cdc.gov  Universal Healthnstituto Nacional sobre el Envejecimiento Schering-Plough(National Institute on Aging): RingConnections.sihttps://go4life.nia.nih.gov Comunquese con un mdico si:  Tiene miedo de caerse en su casa.  Se siente dbil, somnoliento o mareado en su casa.  Se cae en su casa. Resumen  Hay muchas cosas simples que puede hacer para que su casa sea un lugar seguro y ayudar a prevenir las cadas.  Algunas formas de garantizar la seguridad en su casa incluyen eliminar cosas que representen un riesgo de tropiezo e instalar barras para sostn en el bao.  Pida ayuda cuando haga estos cambios en su hogar. Esta informacin no tiene Theme park managercomo fin reemplazar el consejo del mdico. Asegrese de hacerle al mdico cualquier pregunta que tenga. Document Released: 07/13/2007 Document Revised: 01/31/2017 Document Reviewed: 01/31/2017 Elsevier Interactive Patient Education  2019 ArvinMeritorElsevier Inc.

## 2018-05-01 NOTE — Progress Notes (Signed)
[facility] [address] [place]. [place] [address] [phone]       OFFICE FOLLOW UP VISIT NOTE  Mr. Carlton Buskey Date of Birth:  1950/11/08 Medical Record Number:  [id number]   Referring MD:  Naomie Dean Reason for Referral:  Stroke second opinion HPI: Initial visit 11/01/2017 :  Mr Mark Robles is a pleasant 68 year old Hispanic male who is accompanied today by his wife and a Spanish language interpreter were present throughout this visit. History is provided mostly by the patient and his wife. Patient has past medical history of hypertension hyperlipidemia and states he is had several strokes. He states the first one happened in 2006 while he was in [place] when he had transient left hemiparesis. Records for this hospitalization are not available. He recovered very well from this and had another TIA a year later in 2007. Subsequently global [place]. He had his major stroke in 2011 while he was living near [facility]. I do not have those medical records even though they have been sent for. He apparently had significant left hemiparesis and was hospitalized for several days. He subsequently recovered and has been able to now ambulate without assistance but does have visible left leg spasticity and stiffness as well as significant left hand weakness. The patient states he also had a possible episode of seizure about 2 years ago. He was seen in the hospital and [place]. Description as per his wife was that patient was looking from side to side and had a glazed look on his face. She denied any focal extremity tonic-clonic activity. Patient was placed on clonazepam which took a little while and then he ran out. He is had no seizure-like episodes since then. He does not remember having had an EEG for brain MRI done after this event. The patient was diagnosed to have deep and thrombosis in the right leg about 4 years ago and has been on eliquis since then.  He's also on Plavix as well. He does complain of easy bruising but he is had fortunately no bleeding episode. He has not had any follow-up lower extremity ultrasound done. There is no known history of hypercoagulable disorder., Prior DVT or pulmonary embolism. The patient's chief complaint is dizziness which he describes as a feeling of imbalance. The wife states that the patient apparently get up quickly and walks fast. He has not been using a cane. He denies true vertigo. Denies sensation of presyncope or passing out. Update 05/01/2018 : He returns for follow-up after last visit 6 months ago.  He is accompanied by his wife.  A Spanish language interpreter is present throughout this visit and translates.  Patient still complains of dizziness but that he says a feeling of imbalance particularly when he tries to walk quickly or bends down suddenly or makes a quick movement.  He is able to get up and walk by himself but does drag the left leg in dose.  He does use a cane but only outdoors.  He has had no falls or injuries.  He had EEG done on 11/04/2017 which was normal.  Last lipid profile was on 09/19/2017 and showed LDL of 101 mg percent.  Is tolerating his medications well without any side effects.  He has discontinued Eliquis after lower extremity venous Doppler ordered by me at last visit was negative for DVT.  He is currently on Plavix which is tolerating well with without any bleeding or bruising.  He is also on Prozac for depression and  feels it is under control.  He has had no recurrent stroke or TIA symptoms. ROS:   14 system review of systems is positive for dizziness imbalance, walking difficulty,  , stiffness,   and all other systems negative  PMH:  Past Medical History:  Diagnosis Date  . Depression   . DVT (deep venous thrombosis) (HCC)   . Hyperlipidemia   . Left-sided sensory deficit present   . Seizure (HCC)   . Stroke [facility](HCC)     Social History:  Social History   Socioeconomic History    . Marital status: Married    Spouse name: Not on file  . Number of children: 3  . Years of education: 6th grade  . Highest education level: Not on file  Occupational History  . Not on file  Social Needs  . Financial resource strain: Not on file  . Food insecurity:    Worry: Not on file    Inability: Not on file  . Transportation needs:    Medical: Not on file    Non-medical: Not on file  Tobacco Use  . Smoking status: Never Smoker  . Smokeless tobacco: Never Used  Substance and Sexual Activity  . Alcohol use: No    Frequency: Never  . Drug use: No  . Sexual activity: Not on file  Lifestyle  . Physical activity:    Days per week: Not on file    Minutes per session: Not on file  . Stress: Not on file  Relationships  . Social connections:    Talks on phone: Not on file    Gets together: Not on file    Attends religious service: Not on file    Active member of club or organization: Not on file    Attends meetings of clubs or organizations: Not on file    Relationship status: Not on file  . Intimate partner violence:    Fear of current or ex partner: Not on file    Emotionally abused: Not on file    Physically abused: Not on file    Forced sexual activity: Not on file  Other Topics Concern  . Not on file  Social History Narrative   Lives at home with wife & 1 child   Right handed   No caffeine     Medications:   Current Outpatient Medications on File Prior to Visit  Medication Sig Dispense Refill  . amLODipine (NORVASC) 5 MG tablet Take 1 tablet (5 mg total) by mouth daily. 30 tablet 5  . clopidogrel (PLAVIX) 75 MG tablet Take 1 tablet (75 mg total) by mouth daily. 30 tablet 11  . famotidine (PEPCID) 20 MG tablet Take 1 tablet (20 mg total) by mouth 2 (two) times daily. 60 tablet 2  . FLUoxetine (PROZAC) 20 MG tablet TAKE 1 TABLET BY MOUTH DAILY IN THE MORNING 30 tablet 2  . fluticasone (FLONASE) 50 MCG/ACT nasal spray Place 2 sprays into both nostrils daily. 16 g  6  . hydrOXYzine (ATARAX/VISTARIL) 10 MG tablet Take 1 tablet (10 mg total) by mouth 3 (three) times daily as needed. 30 tablet 2  . loperamide (IMODIUM) 2 MG capsule TAKE 2 CAPSULES BY MOUTH INITIALLY THEN 1 CAPSULE AFTER EACH LOOSE STOOL (DO NOT EXCEED 8 CAPSULES IN 24 HOURS)  0  . meclizine (ANTIVERT) 25 MG tablet TAKE 1 TABLET BY MOUTH 3 TIMES DAILY AS NEEDED FOR DIZZINESS. 60 tablet 2  . rosuvastatin (CRESTOR) 20 MG tablet Take 1 tablet (20 mg  total) by mouth daily. 30 tablet 5  . traZODone (DESYREL) 50 MG tablet Take 0.5-1 tablets (25-50 mg total) by mouth at bedtime as needed for sleep. 30 tablet 3   No current facility-administered medications on file prior to visit.     Allergies:  No Known Allergies  Physical Exam General: well developed, well nourished middle-aged Hispanic male, seated, in no evident distress Head: head normocephalic and atraumatic.   Neck: supple with no carotid or supraclavicular bruits Cardiovascular: regular rate and rhythm, no murmurs Musculoskeletal: no deformity Skin:  no rash/petichiae Vascular:  Normal pulses all extremities  Neurologic Exam Mental Status: Awake and fully alert. Oriented to place and time. Recent and remote memory intact. Attention span, concentration and fund of knowledge appropriate. Mood and affect appropriate.  Cranial Nerves: Fundoscopic exam reveals sharp disc margins. Pupils equal, briskly reactive to light. Extraocular movements full without nystagmus. Visual fields full to confrontation. Hearing intact. Facial sensation intact. Mild left lower facial weakness. tongue, palate moves normally and symmetrically.  Motor: Spastic left hemiplegia with 3/5 left upper extremity and 4/5 left lower extremity strength with significant weakness of left grip and intrinsic hand muscles with non-fixed flexion contractures of the left fingers and hand. Mild weakness of left hip flexors and ankle dorsiflexors. Normal strength on the right side.  Tone is increased on the left side with mild spasticity.  Sensory.: intact to touch , pinprick , position and vibratory sensation.  Coordination: Normal on the right side and impacted in the left upper and lower extremity.  Gait and Station: Arises from chair without difficulty. Spastic hemiplegic gait with circumduction of the left leg.  Reflexes: 2+ and asymmetric and brisker on the left side Toes downgoing.   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 Etiology of stroke likely small vessel disease. He is on chronic anticoagulation with eliquis for DVT. Vascular risk factors of hypertension and hyperlipidemia. Complaints  of dizziness mostly related to imbalance when he tries to walk quickly.     PLAN: I had a long d/w patient , his wife via spanish language interpreter about his recent stroke,spastic hemiplegia and gait imbalance, risk for recurrent stroke/TIAs, personally independently reviewed imaging studies and stroke evaluation results and answered questions.Continue Plavix  for secondary stroke prevention and maintain strict control of hypertension with blood pressure goal below 130/90, diabetes with hemoglobin A1c goal below 6.5% and lipids with LDL cholesterol goal below 70 mg/dL. I also advised the patient to eat a healthy diet with plenty of whole grains, cereals, fruits and vegetables, exercise regularly and maintain ideal body weight . We also talked about fall prevention precautions and I advised him to use his cane at all times.Followup in the future with me only as necessary and no scheduled appointment was made Greater than 50% time during this 25 minute  visit was spent on counseling and coordination of care about his remote strokes and possible seizure and answering questions Delia HeadyPramod Margarete Horace, MD  [facility] [address] [place], [place]  [id number]  Phone [phone] Fax [phone] Note: This document was prepared with digital dictation and possible smart phrase technology. Any transcriptional errors that result from this process are unintentional.

## 2018-05-18 MED FILL — hydrOXYzine HCL 10 MG TABS: 10 | 10 days supply | Qty: 30 | Fill #1

## 2018-05-18 MED FILL — traZODone HCL 50 MG TABS: 50 | 30 days supply | Qty: 30 | Fill #1

## 2018-05-18 MED FILL — ROSUVASTATIN CALCIUM 20 MG: 20 | 30 days supply | Qty: 30 | Fill #2

## 2018-05-18 MED FILL — FAMOTIDINE 20 MG TABLET: 20 | 30 days supply | Qty: 60 | Fill #1

## 2018-05-18 MED FILL — ?FLUOXETINE HCL 20MG TAB: 20 | 30 days supply | Qty: 30 | Fill #2

## 2018-05-18 MED FILL — AMLODIPINE BESYLATE 5 MG TA: 5 | 30 days supply | Qty: 30 | Fill #1

## 2018-06-05 MED FILL — hydrOXYzine HCL 10 MG TABS: 10 | 10 days supply | Qty: 30 | Fill #2

## 2018-06-05 MED FILL — CLOPIDOGREL 75 MG TABLET: 75 | 30 days supply | Qty: 30 | Fill #11

## 2018-06-14 ENCOUNTER — Ambulatory Visit: Payer: Self-pay

## 2018-06-19 ENCOUNTER — Encounter: Payer: Self-pay | Admitting: Family Medicine

## 2018-06-19 ENCOUNTER — Ambulatory Visit (INDEPENDENT_AMBULATORY_CARE_PROVIDER_SITE_OTHER): Payer: Self-pay | Admitting: Family Medicine

## 2018-06-19 ENCOUNTER — Other Ambulatory Visit: Payer: Self-pay

## 2018-06-19 VITALS — BP 112/71 | HR 61 | Temp 99.5°F | Resp 16 | Ht 64.96 in | Wt 181.4 lb

## 2018-06-19 DIAGNOSIS — R42 Dizziness and giddiness: Secondary | ICD-10-CM

## 2018-06-19 DIAGNOSIS — I639 Cerebral infarction, unspecified: Secondary | ICD-10-CM

## 2018-06-19 DIAGNOSIS — F320 Major depressive disorder, single episode, mild: Secondary | ICD-10-CM

## 2018-06-19 DIAGNOSIS — H1011 Acute atopic conjunctivitis, right eye: Secondary | ICD-10-CM

## 2018-06-19 DIAGNOSIS — I10 Essential (primary) hypertension: Secondary | ICD-10-CM

## 2018-06-19 MED ORDER — OLOPATADINE HCL 0.2 % OP SOLN: 1.0000 [drp] | Freq: Every day | OPHTHALMIC | 0 refills | Status: DC | PRN

## 2018-06-19 MED ORDER — HYDROXYZINE HCL 10 MG PO TABS: 10.0000 mg | ORAL_TABLET | Freq: Three times a day (TID) | ORAL | 2 refills | Status: DC | PRN

## 2018-06-19 MED ORDER — CLOPIDOGREL BISULFATE 75 MG PO TABS: 75.0000 mg | ORAL_TABLET | Freq: Every day | ORAL | 11 refills | Status: DC

## 2018-06-19 MED ORDER — LOSARTAN POTASSIUM 50 MG PO TABS: 50.0000 mg | ORAL_TABLET | Freq: Every day | ORAL | 3 refills | Status: DC

## 2018-06-19 MED ORDER — FLUOXETINE HCL 20 MG PO TABS: ORAL_TABLET | ORAL | 2 refills | Status: DC

## 2018-06-19 MED FILL — LOSARTAN POTASSIUM 50 MG TA: 50 | 30 days supply | Qty: 30 | Fill #0

## 2018-06-19 MED FILL — OLOPATADINE HCL 0.2% EYE DR: 0.2 | 30 days supply | Qty: 3 | Fill #0

## 2018-06-19 MED FILL — hydrOXYzine HCL 10 MG TABS: 10 | 10 days supply | Qty: 30 | Fill #0

## 2018-06-19 MED FILL — ?FLUOXETINE HCL 20MG TAB: 20 | 30 days supply | Qty: 30 | Fill #0

## 2018-06-19 NOTE — Progress Notes (Signed)
[facility]   Progress Note: General Provider: Mike Gip, FNP  SUBJECTIVE:   Mark Robles is a 68 y.o. male who  has a past medical history of Depression, DVT (deep venous thrombosis) (HCC), Hyperlipidemia, Left-sided sensory deficit present, Seizure (HCC), and Stroke (HCC).. Patient presents today for Eye Problem (right eye itching and watering)  Since the last visit, patient seen by neurology who states that he needs to continue with Plavix, eat a heart healthy diet and exercise. He has been instructed to follow up as needed. Patient continues to have dizziness with changing the position of his vision or his head. He states that he is not having difficulty with going from sitting to standing.  Patient states that he is having itching around the right eye. He states that the eye is watering especially in the cold wind. He states that the tears are causing the itching. Wife states that his left side of this upper and lower extremities are swollen. She states that she noticed this about one week ago. She states that the swelling is minimal.  Patient reports that the trazodone is working too well. He does not take this often. He states that his mood has improved on Prozac. Denies side effects.  Review of Systems  Constitutional: Negative.   HENT: Negative.   Eyes: Positive for discharge.  Respiratory: Negative.   Cardiovascular: Positive for leg swelling.  Gastrointestinal: Negative.   Genitourinary: Negative.   Musculoskeletal: Negative.   Skin: Negative.   Neurological: Positive for dizziness.  Psychiatric/Behavioral: The patient has insomnia (intermittent).      OBJECTIVE: BP 112/71 (BP Location: Right Arm, Patient Position: Sitting, Cuff Size: Normal)   Pulse 61   Temp 99.5 F (37.5 C) (Oral)   Resp 16   Ht 5' 4.96" (1.65 m)   Wt 181 lb 6.4 oz (82.3 kg)   SpO2 99%   BMI 30.22 kg/m   Wt Readings from Last 3  Encounters:  06/19/18 181 lb 6.4 oz (82.3 kg)  05/01/18 179 lb (81.2 kg)  04/20/18 175 lb (79.4 kg)     Physical Exam Vitals signs and nursing note reviewed.  Constitutional:      General: He is not in acute distress.    Appearance: He is well-developed.  HENT:     Head: Normocephalic and atraumatic.  Eyes:     General:        Right eye: Discharge (clear) present.     Extraocular Movements: Extraocular movements intact.     Conjunctiva/sclera: Conjunctivae normal.     Pupils: Pupils are equal, round, and reactive to light.  Neck:     Musculoskeletal: Normal range of motion.  Cardiovascular:     Rate and Rhythm: Normal rate and regular rhythm.     Heart sounds: Normal heart sounds.  Pulmonary:     Effort: Pulmonary effort is normal. No respiratory distress.     Breath sounds: Normal breath sounds.  Abdominal:     General: Bowel sounds are normal. There is no distension.     Palpations: Abdomen is soft.  Musculoskeletal: Normal range of motion.     Left lower leg: Edema (also noted in left hand. ) present.  Skin:    General: Skin is warm and dry.  Neurological:     Mental Status: He is alert and oriented to person, place, and time. Mental status is at baseline.     Gait: Gait abnormal.  Comments: Ambulating with one point cane.  Displays weakness on the left side upper and lower extremity.  Psychiatric:        Mood and Affect: Mood normal.        Behavior: Behavior normal.        Thought Content: Thought content normal.        Judgment: Judgment normal.     ASSESSMENT/PLAN:   - Essential hypertension Will discontinue amlodipine due to extremity swelling. Will start losartan and have patient to rtc in 2 weeks.  - losartan (COZAAR) 50 MG tablet; Take 1 tablet (50 mg total) by mouth daily.  Dispense: 90 tablet; Refill: 3 - Allergic conjunctivitis of right eye Allergic in nature. No other abnormalities. He is noted to have a history of seasonal allergies.  -  Olopatadine HCl (PATADAY) 0.2 % SOLN; Apply 1 drop to eye daily as needed (itching eyes).  Dispense: 2.5 mL; Refill: 0  - Current mild episode of major depressive disorder without prior episode (HCC) Continue with current medication. Patient denies suicidal/homicidal ideations, intent, or plan.Patient denies delusional thinking or psychosis. Wife states improvement.  - FLUoxetine (PROZAC) 20 MG tablet; TAKE 1 TABLET BY MOUTH DAILY IN THE MORNING  Dispense: 30 tablet; Refill: 2 - hydrOXYzine (ATARAX/VISTARIL) 10 MG tablet; Take 1 tablet (10 mg total) by mouth 3 (three) times daily as needed.  Dispense: 30 tablet; Refill: 2  -Cerebrovascular accident (CVA), unspecified mechanism (HCC) - clopidogrel (PLAVIX) 75 MG tablet; Take 1 tablet (75 mg total) by mouth daily.  Dispense: 30 tablet; Refill: 11 - Vertigo -Discussed that vertigo can occur after CVA. Will refer to PT for further evaluation.  - PT vestibular rehab; Future - Language barrier to communication -Interpreter services utilized throughout the encounter.     Return in about 2 weeks (around 07/03/2018) for BP medication changes. .    The patient was given clear instructions to go to ER or return to medical center if symptoms do not improve, worsen or new problems develop. The patient verbalized understanding and agreed with plan of care.   Ms. [name]. Riley Lam, FNP-BC [facility] [address] [place], [place] [address] [phone]

## 2018-06-19 NOTE — Patient Instructions (Addendum)
Recuerde usar su bastn para caminar. Cambia de posicin lentamente.   Losartan tablets Qu es este medicamento? El LOSARTN se [place] para tratar la alta presin sangunea y en ciertos pacientes reduce el riesgo de padecer derrame cerebral. Este medicamento tambin disminuye la evolucin de la enfermedad renal en pacientes diabticos. Este medicamento puede ser utilizado para otros usos; si tiene alguna pregunta consulte con su proveedor de atencin mdica o con su farmacutico. MARCAS COMUNES: Cozaar Qu le debo informar a mi profesional de la salud antes de tomar este medicamento? Necesita saber si usted presenta alguno de los siguientes problemas o situaciones: -insuficiencia cardiaca -enfermedad renal o heptica -una reaccin alrgica o inusual al losartn, a otros medicamentos, alimentos, colorantes o conservantes -si est embarazada o buscando quedar embarazada -si est amamantando a un beb Cmo debo utilizar este medicamento? Tome este medicamento por va oral con un vaso de agua. Siga las instrucciones de la etiqueta del [place]. Este medicamento se puede tomar con o sin alimentos. Tome sus dosis a intervalos regulares. No tome su medicamento con una frecuencia mayor a la indicada. Hable con su pediatra para informarse acerca del uso de este medicamento en nios. Puede requerir atencin especial. Sobredosis: Pngase en contacto inmediatamente con un centro toxicolgico o una sala de urgencia si usted cree que haya tomado demasiado medicamento. ATENCIN: [employer] es solo para usted. No comparta este medicamento con nadie. Qu sucede si me olvido de una dosis? Si olvida una dosis, tmela lo antes posible. Si es casi la hora de la prxima dosis, tome slo esa dosis. No tome dosis adicionales o dobles. Qu puede interactuar con este medicamento? -medicamentos para la presin sangunea -diurticos, especialmente triamtereno, espironolactona o  amilorida -fluconazol -los AINE, medicamentos para el dolor o inflamacin, como ibuprofeno o naproxeno -sales o suplementos de potasio -rifampicina Puede ser que esta lista no menciona todas las posibles interacciones. Informe a su profesional de [employer] de [employer] productos a base de hierbas, medicamentos de [place] o suplementos nutritivos que est tomando. Si usted fuma, consume bebidas alcohlicas o si utiliza drogas ilegales, indqueselo tambin a su profesional de [employer]. Algunas sustancias pueden interactuar con su medicamento. A qu debo estar atento al usar [employer]? Visite a su mdico o a su profesional de la salud para chequear su evolucin peridicamente. Controle su presin sangunea como le haya indicado. Pregunte a su mdico o a su profesional de la salud cul debe ser su presin sangunea y cundo deber comunicarse con l o ella. Comunquese con su mdico o con su profesional de la salud si observa que su pulso cardiaco es rpido o irregular. Las mujeres deben informar a su mdico si estn buscando quedar embarazadas o si creen que estn embarazadas. Existe la posibilidad de efectos secundarios graves a un beb sin nacer, particularmente durante el segundo o tercer trimestre. Para ms informacin hable con su profesional de la salud o su farmacutico. Puede experimentar somnolencia o mareos. No conduzca ni utilice maquinaria, ni haga nada que Scientist, research (life sciences) en estado de alerta hasta que sepa cmo le afecta este medicamento. No se siente ni se ponga de pie con rapidez, especialmente si es un paciente de edad avanzada. Esto reduce el riesgo de mareos o [employer]. El alcohol puede aumentar los mareos y la somnolencia. Evite consumir bebidas alcohlicas. Evite los sustitutos de la sal a menos que su mdico o su profesional de la salud indique lo contrario. No se trate usted mismo para tos, resfros  o dolores mientras est tomando este medicamento sin Science writer a su  mdico o a su profesional de Radiographer, therapeutic. Algunos ingredientes pueden aumentar su presin sangunea. Qu efectos secundarios puedo tener al [employer] este medicamento? Efectos secundarios que debe informar a su mdico o a Producer, television/film/video de la salud tan pronto como sea posible: -confusin, mareos, aturdimiento o Corporate investment banker -reduccin en el volumen de orina -dificultad al respirar o tragar, ronquera o estrechamiento de la garganta -pulso cardiaco rpido o irregular, palpitaciones o dolor en el pecho -erupcin cutnea, picazn -hinchazn del rostro, labios, lengua, manos o pies Efectos secundarios que, por lo general, no requieren atencin mdica (debe informarlos a su mdico o a su profesional de la salud si persisten o si son molestos): -tos -disminucin de la funcin sexual o del deseo -dolor de cabeza -congestin nasal o nariz tapada -nuseas o dolor de Teaching laboratory technician -dolores o calambres musculares Puede ser que esta lista no menciona todos los posibles efectos secundarios. Comunquese a su mdico por asesoramiento mdico [employer]. Usted puede informar los efectos secundarios a la FDA por telfono al 1-800-FDA-1088. Dnde debo guardar mi medicina? Mantngala fuera del alcance de los nios. Gurdela a [employer], entre 15 y 30 grados C (44 y 59 grados F). Protjala de la luz. Mantenga el envase bien cerrado. Deseche todo el medicamento que no haya utilizado, despus de la fecha de vencimiento. ATENCIN: Este folleto es un resumen. Puede ser que no cubra toda la posible informacin. Si usted tiene preguntas acerca de esta medicina, consulte con su mdico, su farmacutico o su profesional de Radiographer, therapeutic.  2019 Elsevier/Gold Standard (2014-05-28 00:00:00)

## 2018-06-23 MED FILL — ROSUVASTATIN CALCIUM 20 MG: 20 | 30 days supply | Qty: 30 | Fill #3

## 2018-07-05 ENCOUNTER — Ambulatory Visit: Payer: Self-pay | Admitting: Family Medicine

## 2018-07-05 ENCOUNTER — Other Ambulatory Visit: Payer: Self-pay

## 2018-07-13 ENCOUNTER — Ambulatory Visit (INDEPENDENT_AMBULATORY_CARE_PROVIDER_SITE_OTHER): Payer: Self-pay | Admitting: Family Medicine

## 2018-07-13 ENCOUNTER — Other Ambulatory Visit: Payer: Self-pay

## 2018-07-13 ENCOUNTER — Encounter: Payer: Self-pay | Admitting: Family Medicine

## 2018-07-13 VITALS — BP 118/68 | HR 60 | Temp 98.2°F | Ht 64.0 in | Wt 178.0 lb

## 2018-07-13 DIAGNOSIS — I10 Essential (primary) hypertension: Secondary | ICD-10-CM

## 2018-07-13 DIAGNOSIS — Z23 Encounter for immunization: Secondary | ICD-10-CM

## 2018-07-13 LAB — POCT URINALYSIS DIP (MANUAL ENTRY)
Bilirubin, UA: NEGATIVE
Blood, UA: NEGATIVE
Glucose, UA: NEGATIVE mg/dL
Ketones, POC UA: NEGATIVE mg/dL
Leukocytes, UA: NEGATIVE
Nitrite, UA: NEGATIVE
Protein Ur, POC: NEGATIVE mg/dL
Spec Grav, UA: 1.015 (ref 1.010–1.025)
Urobilinogen, UA: 0.2 E.U./dL
pH, UA: 6 (ref 5.0–8.0)

## 2018-07-13 NOTE — Patient Instructions (Signed)
Hipertensin  Hypertension  Introduccin  El trmino hipertensin es otra forma de denominar a la presin arterial elevada. La presin arterial elevada fuerza al corazn a trabajar ms para bombear la sangre. Esto puede causar problemas con el paso del tiempo.  Una lectura de presin arterial est compuesta por 2 nmeros. Hay un nmero superior (sistlico) sobre un nmero inferior (diastlico). Lo ideal es tener la presin arterial por debajo de 120/80. Las decisiones saludables pueden ayudarle a disminuir su presin arterial. Es posible que necesite medicamentos que le ayuden a disminuir su presin arterial si:   Su presin arterial no disminuye mediante decisiones saludables.   Su presin arterial est por encima de 130/80.  Siga estas instrucciones en su casa:  Comida y bebida     Si se lo indican, siga el plan de alimentacin de DASH (Dietary Approaches to Stop Hypertension, Maneras de alimentarse para detener la hipertensin). Esta dieta incluye:  ? Que la mitad del plato de cada comida sea de frutas y verduras.  ? Que un cuarto del plato de cada comida sea de cereales integrales. Los cereales integrales incluyen pasta integral, arroz integral y pan integral.  ? Comer y beber productos lcteos con bajo contenido de grasa, como leche descremada o yogur bajo en grasas.  ? Que un cuarto del plato de cada comida sea de protenas bajas en grasa (magras). Las protenas bajas en grasa incluyen pescado, pollo sin piel, huevos, frijoles y tofu.  ? Evitar consumir carne grasa, carne curada y procesada, o pollo con piel.  ? Evitar consumir alimentos prehechos o procesados.   Consuma menos de 1500 mg de sal (sodio) por da.   Limite el consumo de alcohol a no ms de 1 medida por da si es mujer y no est embarazada y a 2 medidas por da si es hombre. Una medida equivale a 12onzas de cerveza, 5onzas de vino o 1onzas de bebidas alcohlicas de alta graduacin.  Estilo de vida   Trabaje con su mdico para mantenerse  en un peso saludable o para perder peso. Pregntele a su mdico cul es el peso recomendable para usted.   Realice al menos 30 minutos de ejercicio que haga que se acelere su corazn (ejercicio aerbico) la mayora de los das de la semana. Estos pueden incluir caminar, nadar o andar en bicicleta.   Realice al menos 30 minutos de ejercicio que fortalezca sus msculos (ejercicios de resistencia) al menos 3 das a la semana. Estos pueden incluir levantar pesas o hacer pilates.   No consuma ningn producto que contenga nicotina o tabaco. Esto incluye cigarrillos y cigarrillos electrnicos. Si necesita ayuda para dejar de fumar, consulte al mdico.   Controle su presin arterial en su casa tal como le indic el mdico.   Concurra a todas las visitas de control como se lo haya indicado el mdico. Esto es importante.  Medicamentos   Tome los medicamentos de venta libre y los recetados solamente como se lo haya indicado el mdico. Siga cuidadosamente las indicaciones.   No omita las dosis de medicamentos para la presin arterial. Los medicamentos pierden eficacia si omite dosis. El hecho de omitir las dosis tambin aumenta el riesgo de otros problemas.   Pregntele a su mdico a qu efectos secundarios o reacciones a los medicamentos debe prestar atencin.  Comunquese con un mdico si:   Piensa que tiene una reaccin a los medicamentos que est tomando.   Tiene dolores de cabeza frecuentes (recurrentes).   Siente mareos.     Tiene hinchazn en los tobillos.   Tiene problemas de visin.  Solicite ayuda de inmediato si:   Siente un dolor de cabeza muy intenso.   Comienza a sentirse confundido.   Se siente dbil o adormecido.   Siente que va a desmayarse.   Siente un dolor muy intenso en:  ? El pecho.  ? El vientre (abdomen).   Devuelve (vomita) ms de una vez.   Tiene dificultad para respirar.  Resumen   El trmino hipertensin es otra forma de denominar a la presin arterial elevada.   Las decisiones  saludables pueden ayudarle a disminuir su presin arterial. Si no puede controlar su presin arterial mediante decisiones saludables, es posible que deba tomar medicamentos.  Esta informacin no tiene como fin reemplazar el consejo del mdico. Asegrese de hacerle al mdico cualquier pregunta que tenga.  Document Released: 09/23/2009 Document Revised: 03/17/2016 Document Reviewed: 03/17/2016  Elsevier Interactive Patient Education  2019 [employer]

## 2018-07-13 NOTE — Progress Notes (Signed)
  [facility]   Progress Note: General Provider: Mike Gip, FNP  SUBJECTIVE:   Mark Robles is a 68 y.o. male who  has a past medical history of Depression, DVT (deep venous thrombosis) (HCC), Hyperlipidemia, Left-sided sensory deficit present, Seizure (HCC), and Stroke (HCC).. Patient presents today for Follow-up (HTN )  At the last visit, his amlodipine was discontinued due to swelling of his extremities. He reports that swelling has ceased. He is doing well on losartan. No complaints today.    Review of Systems  Constitutional: Negative.   HENT: Negative.   Eyes: Negative.   Respiratory: Negative.   Cardiovascular: Negative.   Gastrointestinal: Negative.   Genitourinary: Negative.   Musculoskeletal: Negative.   Skin: Negative.   Neurological: Negative.   Psychiatric/Behavioral: Negative.      OBJECTIVE: BP 118/68 (BP Location: Right Arm, Patient Position: Sitting, Cuff Size: Small)   Pulse 60   Temp 98.2 F (36.8 C) (Oral)   Ht 5\' 4"  (1.626 m)   Wt 178 lb (80.7 kg)   SpO2 98%   BMI 30.55 kg/m   Wt Readings from Last 3 Encounters:  07/13/18 178 lb (80.7 kg)  06/19/18 181 lb 6.4 oz (82.3 kg)  05/01/18 179 lb (81.2 kg)     Physical Exam Vitals signs and nursing note reviewed.  Constitutional:      General: He is not in acute distress.    Appearance: Normal appearance. He is well-developed.  HENT:     Head: Normocephalic and atraumatic.  Eyes:     Conjunctiva/sclera: Conjunctivae normal.     Pupils: Pupils are equal, round, and reactive to light.  Neck:     Musculoskeletal: Normal range of motion.  Cardiovascular:     Rate and Rhythm: Normal rate and regular rhythm.     Heart sounds: Normal heart sounds.  Pulmonary:     Effort: Pulmonary effort is normal. No respiratory distress.     Breath sounds: Normal breath sounds.  Abdominal:     General: Bowel sounds are normal. There is no  distension.     Palpations: Abdomen is soft.  Musculoskeletal: Normal range of motion.  Skin:    General: Skin is warm and dry.  Neurological:     Mental Status: He is alert and oriented to person, place, and time.     Gait: Gait abnormal.  Psychiatric:        Mood and Affect: Mood normal.        Behavior: Behavior normal.        Thought Content: Thought content normal.        Judgment: Judgment normal.     ASSESSMENT/PLAN:  1. Essential hypertension Continue with current medications.  - POCT urinalysis dipstick  2. Need for vaccination against Streptococcus pneumoniae using pneumococcal conjugate vaccine 13 Vaccine given today - Pneumococcal conjugate vaccine 13-valent     Return in about 3 months (around 10/13/2018).    The patient was given clear instructions to go to ER or return to medical center if symptoms do not improve, worsen or new problems develop. The patient verbalized understanding and agreed with plan of care.   Ms. [name]. Riley Lam, FNP-BC [facility] [address] [place], [place] [address] [phone]

## 2018-07-21 ENCOUNTER — Telehealth: Payer: Self-pay

## 2018-07-22 ENCOUNTER — Other Ambulatory Visit: Payer: Self-pay | Admitting: Family Medicine

## 2018-07-22 DIAGNOSIS — H1011 Acute atopic conjunctivitis, right eye: Secondary | ICD-10-CM

## 2018-07-22 MED FILL — ?FLUOXETINE HCL 20MG TAB: 20 | 30 days supply | Qty: 30 | Fill #1

## 2018-07-22 MED FILL — hydrOXYzine HCL 10 MG TABS: 10 | 10 days supply | Qty: 30 | Fill #1

## 2018-07-22 MED FILL — LOSARTAN POTASSIUM 50 MG TA: 50 | 30 days supply | Qty: 30 | Fill #1

## 2018-07-22 MED FILL — CLOPIDOGREL 75 MG TABLET: 75 | 30 days supply | Qty: 30 | Fill #0

## 2018-07-22 MED FILL — ROSUVASTATIN CALCIUM 20 MG: 20 | 30 days supply | Qty: 30 | Fill #4

## 2018-07-22 MED FILL — ?MECLIZINE 25MG TAB: 25 | 20 days supply | Qty: 60 | Fill #1

## 2018-07-24 MED FILL — OLOPATADINE HCL 0.2% EYE DR: 0.2 | 30 days supply | Qty: 3 | Fill #0

## 2018-07-25 NOTE — Telephone Encounter (Signed)
Interpreter left a vm for patient to callback

## 2018-08-02 ENCOUNTER — Encounter: Payer: Self-pay | Admitting: Emergency Medicine

## 2018-08-02 ENCOUNTER — Ambulatory Visit (INDEPENDENT_AMBULATORY_CARE_PROVIDER_SITE_OTHER): Payer: Self-pay

## 2018-08-02 ENCOUNTER — Ambulatory Visit
Admission: EM | Admit: 2018-08-02 | Discharge: 2018-08-02 | Disposition: A | Discharge status: Home / Self Care | Payer: Self-pay | Attending: Physician Assistant | Admitting: Physician Assistant

## 2018-08-02 ENCOUNTER — Other Ambulatory Visit: Payer: Self-pay

## 2018-08-02 DIAGNOSIS — J189 Pneumonia, unspecified organism: Secondary | ICD-10-CM

## 2018-08-02 MED ORDER — LEVOFLOXACIN 750 MG PO TABS: 750.0000 mg | ORAL_TABLET | Freq: Every day | ORAL | 0 refills | Status: DC

## 2018-08-02 MED FILL — levoFLOXacin 750 MG TABS: 750 | 5 days supply | Qty: 5 | Fill #0

## 2018-08-02 NOTE — ED Triage Notes (Addendum)
Pt presents to [facility] for assessment of fevers, body aches, central chest pain worse with cough.  Pt's wife and child are currently being treated for pneumonia.  Pt states the worst symptoms he is experiencing is dizziness, which is normal since his stroke, but has been worse the past few days.  Pt endorses a fall yesterday, denies head injury, denies LOC.

## 2018-08-02 NOTE — ED Provider Notes (Signed)
[facility]    CSN: [id number] Arrival date & time: 08/02/18  1255     History   Chief Complaint Chief Complaint  Patient presents with  . Flu-Like Symptoms    HPI Jalik Gellatly is a 68 y.o. male.   68 year old male with history of CVA with residual left hemiparesis, HLD, depression comes in with family member for 6 day history of URI symptoms. Has had cough, subjective fever. Has had central chest pain with cough. Denies rhinorrhea, nasal congestion. Has had sore throat without trouble swallowing, trouble breathing, swelling of the throat, tripoding, drooling, trismus. Has had body aches. States cough is productive. States has had baseline dizziness since CVA, but feels worsening in the past few days. Fell yesterday as room seemed to be spinning. Denies head injury, loss of consciousness. Has not had improvement with meclizine. He denies worsening of left sided weakness, aphasia, confusion. Family member states he has been acting normal at home. States wife and child is currently treated for pneumonia, unknown COVID status. He has not taken anything for the symptoms.      Past Medical History:  Diagnosis Date  . Depression   . DVT (deep venous thrombosis) (HCC)   . Hyperlipidemia   . Left-sided sensory deficit present   . Seizure (HCC)   . Stroke [facility])     Patient Active Problem List   Diagnosis Date Noted  . Insomnia 04/20/2018  . Language barrier to communication 03/23/2018  . Cerebrovascular accident (CVA) (HCC) 03/23/2018  . Essential hypertension 03/23/2018  . Current mild episode of major depressive disorder without prior episode (HCC) 03/23/2018  . Vertigo 08/12/2017  . Sinus bradycardia 08/12/2017  . Dyslipidemia 08/12/2017  . Spastic hemiplegia (HCC) 06/20/2017  . 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 NO PAST SURGERIES         Home  Medications    Prior to Admission medications   Medication Sig Start Date End Date Taking? Authorizing Provider  clopidogrel (PLAVIX) 75 MG tablet Take 1 tablet (75 mg total) by mouth daily. 06/19/18   Mike Gip, FNP  famotidine (PEPCID) 20 MG tablet Take 1 tablet (20 mg total) by mouth 2 (two) times daily. 03/23/18   Mike Gip, FNP  FLUoxetine (PROZAC) 20 MG tablet TAKE 1 TABLET BY MOUTH DAILY IN THE MORNING 06/19/18   Mike Gip, FNP  fluticasone [facility]) 50 MCG/ACT nasal spray Place 2 sprays into both nostrils daily. 10/26/17   Mike Gip, FNP  hydrOXYzine (ATARAX/VISTARIL) 10 MG tablet Take 1 tablet (10 mg total) by mouth 3 (three) times daily as needed. 06/19/18   Mike Gip, FNP  levofloxacin (LEVAQUIN) 750 MG tablet Take 1 tablet (750 mg total) by mouth daily. 08/02/18   Cathie Hoops,  V, PA-C  loperamide (IMODIUM) 2 MG capsule TAKE 2 CAPSULES BY MOUTH INITIALLY THEN 1 CAPSULE AFTER EACH LOOSE STOOL (DO NOT EXCEED 8 CAPSULES IN 24 HOURS) 10/10/17   [provider]  losartan (COZAAR) 50 MG tablet Take 1 tablet (50 mg total) by mouth daily. 06/19/18   Mike Gip, FNP  meclizine (ANTIVERT) 25 MG tablet TAKE 1 TABLET BY MOUTH 3 TIMES DAILY AS NEEDED FOR DIZZINESS. 03/30/18   Mike Gip, FNP  Olopatadine HCl 0.2 % SOLN APPLY 1 DROP TO EYE DAILY AS NEEDED (ITCHING EYES). 07/24/18   Mike Gip, FNP  rosuvastatin (CRESTOR) 20 MG tablet Take 1 tablet (20 mg total)  by mouth daily. 03/23/18   Mike Gipouglas, Andre, FNP  traZODone (DESYREL) 50 MG tablet Take 0.5-1 tablets (25-50 mg total) by mouth at bedtime as needed for sleep. Patient not taking: Reported on 07/13/2018 04/20/18   Mike Gipouglas, Andre, FNP    Family History Family History  Problem Relation Age of Onset  . Hypertension Mother        tachycardia, pacemaker  . Hypertension Sister   . Hypertension Brother   . Hypertension Maternal Uncle     Social History Social History   Tobacco Use  . Smoking status: Never Smoker  .  Smokeless tobacco: Never Used  Substance Use Topics  . Alcohol use: No    Frequency: Never  . Drug use: No     Allergies   Amlodipine   Review of Systems Review of Systems  Reason unable to perform ROS: See HPI as above.     Physical Exam Triage Vital Signs ED Triage Vitals  Enc Vitals Group     BP 08/02/18 1306 106/71     Pulse Rate 08/02/18 1306 76     Resp 08/02/18 1306 18     Temp 08/02/18 1306 99.8 F (37.7 C)     Temp Source 08/02/18 1306 Oral     SpO2 08/02/18 1306 91 %     Weight --      Height --      Head Circumference --      Peak Flow --      Pain Score 08/02/18 1307 0     Pain Loc --      Pain Edu? --      Excl. in GC? --    No data found.  Updated Vital Signs BP 106/71 (BP Location: Left Arm)   Pulse 76   Temp 99.8 F (37.7 C) (Oral)   Resp 18   SpO2 91%   Physical Exam Constitutional:      General: He is not in acute distress.    Appearance: Normal appearance. He is not ill-appearing, toxic-appearing or diaphoretic.  HENT:     Head: Normocephalic and atraumatic.     Mouth/Throat:     Mouth: Mucous membranes are moist.     Pharynx: Oropharynx is clear. Uvula midline.  Eyes:     Extraocular Movements: Extraocular movements intact.     Conjunctiva/sclera: Conjunctivae normal.     Pupils: Pupils are equal, round, and reactive to light.  Neck:     Musculoskeletal: Normal range of motion and neck supple.  Cardiovascular:     Rate and Rhythm: Normal rate and regular rhythm.     Heart sounds: Normal heart sounds. No murmur. No friction rub. No gallop.   Pulmonary:     Effort: Pulmonary effort is normal. No accessory muscle usage, prolonged expiration, respiratory distress or retractions.     Comments: Lungs clear to auscultation without adventitious lung sounds. Neurological:     General: No focal deficit present.     Mental Status: He is alert and oriented to person, place, and time.     GCS: GCS eye subscore is 4. GCS verbal subscore is  5. GCS motor subscore is 6.     Comments: Left sided weakness of face, upper and lower extremity that patient and family member states at baseline. Able to follow directions. Walks with a cane, but ambulates on own. Abnormal gait that is at baseline per patient and family member. Unable to perform romberg/pronator drift, finger to nose, heel to shin as patient  states ROM hard to reach.       UC Treatments / Results  Labs (all labs ordered are listed, but only abnormal results are displayed) Labs Reviewed - No data to display  EKG None  Radiology Dg Chest 2 View  Result Date: 08/02/2018 CLINICAL DATA:  Cough and hypoxia EXAM: CHEST - 2 VIEW COMPARISON:  None. FINDINGS: Bilateral mid lung predominant airspace opacities, right greater than left. No pleural effusion or pneumothorax. Normal cardiomediastinal contours. IMPRESSION: Bilateral mid lung predominant airspace opacities, right greater than left, concerning for multifocal infection. Electronically Signed   By: Deatra Robinson M.D.   On: 08/02/2018 14:09    Procedures Procedures (including critical care time)  Medications Ordered in UC Medications - No data to display  Initial Impression / Assessment and Plan / UC Course  I have reviewed the triage vital signs and the nursing notes.  Pertinent labs & imaging results that were available during my care of the patient were reviewed by me and considered in my medical decision making (see chart for details).    Case discussed with Dr Tracie Harrier. 68 year old male with history of CVA with residual left hemiparesis, HLD, depression comes in for 6 day history of URI symptoms. He denies shortness of breath. Both wife and child currently being treated for pneumonia, unknown COVID status. He is afebrile without antipyretic, without tachycardia, tachypnea. O2 say 90-91% on room air. LCTAB. Speaking in full sentences. CXR with multifocal pneumonia, R>L. COVID testing limited to inpatient. Given patient  stable, will have patient start levaquin to cover for bacterial pneumonia. Quarantine process discussed. If any worsening of symptoms, patient to go to the ED for further evaluation. Patient to follow up with PCP in 5 days for recheck of symptoms. Video translator used to ensure complete understanding on current plan. All questions answered. Patient expresses understanding and agrees to plan.  Case discussed with Dr Tracie Harrier, who agrees to plan.  Final Clinical Impressions(s) / UC Diagnoses   Final diagnoses:  Multifocal pneumonia    ED Prescriptions    Medication Sig Dispense Auth. Provider   levofloxacin (LEVAQUIN) 750 MG tablet Take 1 tablet (750 mg total) by mouth daily. 5 tablet Threasa Alpha, [place] 08/02/18 1534

## 2018-08-02 NOTE — Telephone Encounter (Signed)
Tried to contact patient again no answer 

## 2018-08-02 NOTE — ED Notes (Signed)
Patient able to ambulate independently  

## 2018-08-02 NOTE — Discharge Instructions (Addendum)
Chest xray shows multifocal pneumonia. I am concerns for COVID. Your oxygen level is slightly low, but given no shortness of breath, will have you continue to monitor. Start levaquin as directed for possible bacterial pneumonia. I would like you to quarantine for 7 days since symptoms onset AND >72 hours without fever, and improved respiratory symptoms. If experiencing shortness of breath, trouble breathing, call 911 and provide them with your current situation. Otherwise, make appointment with PCP for phone interview.

## 2018-08-03 NOTE — Telephone Encounter (Signed)
Left a vm for patient to callback 

## 2018-08-09 ENCOUNTER — Telehealth: Payer: Self-pay

## 2018-08-09 ENCOUNTER — Other Ambulatory Visit: Payer: Self-pay

## 2018-08-09 ENCOUNTER — Inpatient Hospital Stay (HOSPITAL_COMMUNITY)
Admission: EM | Admit: 2018-08-09 | Discharge: 2018-08-16 | DRG: 208 | Disposition: A | Discharge status: Home Health | Payer: HRSA Program | Attending: Internal Medicine | Admitting: Internal Medicine

## 2018-08-09 ENCOUNTER — Emergency Department (HOSPITAL_COMMUNITY): Payer: HRSA Program

## 2018-08-09 DIAGNOSIS — Z888 Allergy status to other drugs, medicaments and biological substances status: Secondary | ICD-10-CM

## 2018-08-09 DIAGNOSIS — A4189 Other specified sepsis: Secondary | ICD-10-CM | POA: Diagnosis not present

## 2018-08-09 DIAGNOSIS — I69354 Hemiplegia and hemiparesis following cerebral infarction affecting left non-dominant side: Secondary | ICD-10-CM

## 2018-08-09 DIAGNOSIS — I69311 Memory deficit following cerebral infarction: Secondary | ICD-10-CM | POA: Diagnosis not present

## 2018-08-09 DIAGNOSIS — I639 Cerebral infarction, unspecified: Secondary | ICD-10-CM | POA: Diagnosis not present

## 2018-08-09 DIAGNOSIS — R0603 Acute respiratory distress: Secondary | ICD-10-CM

## 2018-08-09 DIAGNOSIS — Z978 Presence of other specified devices: Secondary | ICD-10-CM

## 2018-08-09 DIAGNOSIS — J80 Acute respiratory distress syndrome: Secondary | ICD-10-CM | POA: Diagnosis present

## 2018-08-09 DIAGNOSIS — R600 Localized edema: Secondary | ICD-10-CM | POA: Diagnosis present

## 2018-08-09 DIAGNOSIS — Z86718 Personal history of other venous thrombosis and embolism: Secondary | ICD-10-CM | POA: Diagnosis not present

## 2018-08-09 DIAGNOSIS — J069 Acute upper respiratory infection, unspecified: Secondary | ICD-10-CM | POA: Diagnosis not present

## 2018-08-09 DIAGNOSIS — Z4659 Encounter for fitting and adjustment of other gastrointestinal appliance and device: Secondary | ICD-10-CM

## 2018-08-09 DIAGNOSIS — Z452 Encounter for adjustment and management of vascular access device: Secondary | ICD-10-CM

## 2018-08-09 DIAGNOSIS — R6521 Severe sepsis with septic shock: Secondary | ICD-10-CM | POA: Clinically undetermined

## 2018-08-09 DIAGNOSIS — E861 Hypovolemia: Secondary | ICD-10-CM | POA: Diagnosis present

## 2018-08-09 DIAGNOSIS — Z7951 Long term (current) use of inhaled steroids: Secondary | ICD-10-CM | POA: Diagnosis not present

## 2018-08-09 DIAGNOSIS — J1289 Other viral pneumonia: Secondary | ICD-10-CM | POA: Diagnosis present

## 2018-08-09 DIAGNOSIS — Z8659 Personal history of other mental and behavioral disorders: Secondary | ICD-10-CM

## 2018-08-09 DIAGNOSIS — Z7902 Long term (current) use of antithrombotics/antiplatelets: Secondary | ICD-10-CM | POA: Diagnosis not present

## 2018-08-09 DIAGNOSIS — Z8249 Family history of ischemic heart disease and other diseases of the circulatory system: Secondary | ICD-10-CM

## 2018-08-09 DIAGNOSIS — E871 Hypo-osmolality and hyponatremia: Secondary | ICD-10-CM | POA: Diagnosis present

## 2018-08-09 DIAGNOSIS — F329 Major depressive disorder, single episode, unspecified: Secondary | ICD-10-CM | POA: Diagnosis present

## 2018-08-09 DIAGNOSIS — J988 Other specified respiratory disorders: Secondary | ICD-10-CM

## 2018-08-09 DIAGNOSIS — E785 Hyperlipidemia, unspecified: Secondary | ICD-10-CM | POA: Diagnosis present

## 2018-08-09 DIAGNOSIS — I10 Essential (primary) hypertension: Secondary | ICD-10-CM | POA: Diagnosis present

## 2018-08-09 DIAGNOSIS — J1282 Pneumonia due to coronavirus disease 2019: Secondary | ICD-10-CM

## 2018-08-09 DIAGNOSIS — Z79899 Other long term (current) drug therapy: Secondary | ICD-10-CM

## 2018-08-09 LAB — CBC WITH DIFFERENTIAL/PLATELET
Abs Immature Granulocytes: 0.1 10*3/uL — ABNORMAL HIGH (ref 0.00–0.07)
Abs Immature Granulocytes: 0.08 10*3/uL — ABNORMAL HIGH (ref 0.00–0.07)
Basophils Absolute: 0 10*3/uL (ref 0.0–0.1)
Basophils Absolute: 0 10*3/uL (ref 0.0–0.1)
Basophils Relative: 0 %
Basophils Relative: 0 %
Eosinophils Absolute: 0.2 10*3/uL (ref 0.0–0.5)
Eosinophils Absolute: 0.2 10*3/uL (ref 0.0–0.5)
Eosinophils Relative: 3 %
Eosinophils Relative: 3 %
HCT: 23.4 % — ABNORMAL LOW (ref 39.0–52.0)
HCT: 39.6 % (ref 39.0–52.0)
Hemoglobin: 7.9 g/dL — ABNORMAL LOW (ref 13.0–17.0)
Hemoglobin: 13.7 g/dL (ref 13.0–17.0)
Immature Granulocytes: 1 %
Immature Granulocytes: 1 %
Lymphocytes Relative: 13 %
Lymphocytes Relative: 12 %
Lymphs Abs: 1.1 10*3/uL (ref 0.7–4.0)
Lymphs Abs: 0.8 10*3/uL (ref 0.7–4.0)
MCH: 32.1 pg (ref 26.0–34.0)
MCH: 31.4 pg (ref 26.0–34.0)
MCHC: 33.8 g/dL (ref 30.0–36.0)
MCHC: 34.6 g/dL (ref 30.0–36.0)
MCV: 95.1 fL (ref 80.0–100.0)
MCV: 90.6 fL (ref 80.0–100.0)
Monocytes Absolute: 0.2 10*3/uL (ref 0.1–1.0)
Monocytes Absolute: 0.2 10*3/uL (ref 0.1–1.0)
Monocytes Relative: 2 %
Monocytes Relative: 4 %
Neutro Abs: 7.2 10*3/uL (ref 1.7–7.7)
Neutro Abs: 5 10*3/uL (ref 1.7–7.7)
Neutrophils Relative %: 81 %
Neutrophils Relative %: 80 %
Platelets: 224 10*3/uL (ref 150–400)
Platelets: 168 10*3/uL (ref 150–400)
RBC: 2.46 MIL/uL — ABNORMAL LOW (ref 4.22–5.81)
RBC: 4.37 MIL/uL (ref 4.22–5.81)
RDW: 13.9 % (ref 11.5–15.5)
RDW: 13.4 % (ref 11.5–15.5)
WBC: 8.8 10*3/uL (ref 4.0–10.5)
WBC: 6.3 10*3/uL (ref 4.0–10.5)
nRBC: 0 % (ref 0.0–0.2)
nRBC: 0 % (ref 0.0–0.2)

## 2018-08-09 LAB — PROCALCITONIN: Procalcitonin: 0.45 ng/mL

## 2018-08-09 LAB — COMPREHENSIVE METABOLIC PANEL
ALT: 191 U/L — ABNORMAL HIGH (ref 0–44)
AST: 180 U/L — ABNORMAL HIGH (ref 15–41)
Albumin: 2.3 g/dL — ABNORMAL LOW (ref 3.5–5.0)
Alkaline Phosphatase: 99 U/L (ref 38–126)
Anion gap: 10 (ref 5–15)
BUN: 11 mg/dL (ref 8–23)
CO2: 21 mmol/L — ABNORMAL LOW (ref 22–32)
Calcium: 8.3 mg/dL — ABNORMAL LOW (ref 8.9–10.3)
Chloride: 100 mmol/L (ref 98–111)
Creatinine, Ser: 0.85 mg/dL (ref 0.61–1.24)
GFR calc Af Amer: >60 mL/min (ref >60)
GFR calc non Af Amer: >60 mL/min (ref >60)
Glucose, Bld: 163 mg/dL — ABNORMAL HIGH (ref 70–99)
Potassium: 4.5 mmol/L (ref 3.5–5.1)
Sodium: 131 mmol/L — ABNORMAL LOW (ref 135–145)
Total Bilirubin: 0.7 mg/dL (ref 0.3–1.2)
Total Protein: 6.4 g/dL — ABNORMAL LOW (ref 6.5–8.1)

## 2018-08-09 LAB — TYPE AND SCREEN
ABO/RH(D): A POS
Antibody Screen: NEGATIVE
Unit division: 0

## 2018-08-09 LAB — SARS CORONAVIRUS 2 BY RT PCR (HOSPITAL ORDER, PERFORMED IN ~~LOC~~ HOSPITAL LAB): SARS Coronavirus 2: POSITIVE — AB

## 2018-08-09 LAB — LACTIC ACID, PLASMA
Lactic Acid, Venous: 2.4 mmol/L (ref 0.5–1.9)
Lactic Acid, Venous: 1.9 mmol/L (ref 0.5–1.9)

## 2018-08-09 LAB — FIBRINOGEN: Fibrinogen: 705 mg/dL — ABNORMAL HIGH (ref 210–475)

## 2018-08-09 LAB — TRIGLYCERIDES: Triglycerides: 101 mg/dL (ref <150)

## 2018-08-09 LAB — C-REACTIVE PROTEIN: CRP: 22.2 mg/dL — ABNORMAL HIGH (ref <1.0)

## 2018-08-09 LAB — BPAM RBC
Blood Product Expiration Date: 202004282359
ISSUE DATE / TIME: 202004200929
Unit Type and Rh: 6200

## 2018-08-09 LAB — BRAIN NATRIURETIC PEPTIDE: B Natriuretic Peptide: 13.4 pg/mL (ref 0.0–100.0)

## 2018-08-09 LAB — LACTATE DEHYDROGENASE: LDH: 516 U/L — ABNORMAL HIGH (ref 98–192)

## 2018-08-09 LAB — FERRITIN: Ferritin: 679 ng/mL — ABNORMAL HIGH (ref 24–336)

## 2018-08-09 LAB — CREATININE, SERUM
Creatinine, Ser: 0.9 mg/dL (ref 0.61–1.24)
GFR calc Af Amer: >60 mL/min (ref >60)
GFR calc non Af Amer: >60 mL/min (ref >60)

## 2018-08-09 LAB — SEDIMENTATION RATE: Sed Rate: 85 mm/hr — ABNORMAL HIGH (ref 0–16)

## 2018-08-09 LAB — TROPONIN I: Troponin I: <0.03 ng/mL (ref <0.03)

## 2018-08-09 LAB — ABO/RH: ABO/RH(D): A POS

## 2018-08-09 LAB — D-DIMER, QUANTITATIVE (NOT AT ARMC): D-Dimer, Quant: >20 ug/mL-FEU — ABNORMAL HIGH (ref 0.00–0.50)

## 2018-08-09 MED ORDER — HYDROXYCHLOROQUINE SULFATE 200 MG PO TABS: 400.0000 mg | ORAL_TABLET | Freq: Two times a day (BID) | ORAL | Status: DC

## 2018-08-09 MED ORDER — SALINE SPRAY 0.65 % NA SOLN
1.0000 | NASAL | Status: DC | PRN
  Filled 2018-08-09: qty 44

## 2018-08-09 MED ORDER — ENOXAPARIN SODIUM 40 MG/0.4ML ~~LOC~~ SOLN: 40.0000 mg | SUBCUTANEOUS | Status: DC

## 2018-08-09 MED ORDER — ENOXAPARIN SODIUM 80 MG/0.8ML ~~LOC~~ SOLN
1.0000 mg/kg | Freq: Two times a day (BID) | SUBCUTANEOUS | Status: DC
  Administered 2018-08-09: 80 mg via SUBCUTANEOUS
  Filled 2018-08-09 (×3): qty 0.8

## 2018-08-09 MED ORDER — HYDROCORTISONE (PERIANAL) 2.5 % EX CREA
1.0000 "application " | TOPICAL_CREAM | Freq: Four times a day (QID) | CUTANEOUS | Status: DC | PRN
  Filled 2018-08-09: qty 28.35

## 2018-08-09 MED ORDER — SENNOSIDES-DOCUSATE SODIUM 8.6-50 MG PO TABS: 2.0000 | ORAL_TABLET | Freq: Every evening | ORAL | Status: DC | PRN

## 2018-08-09 MED ORDER — HYDRALAZINE HCL 20 MG/ML IJ SOLN: 10.0000 mg | INTRAMUSCULAR | Status: DC | PRN

## 2018-08-09 MED ORDER — SENNOSIDES-DOCUSATE SODIUM 8.6-50 MG PO TABS: 1.0000 | ORAL_TABLET | Freq: Every evening | ORAL | Status: DC | PRN

## 2018-08-09 MED ORDER — PHENOL 1.4 % MT LIQD
1.0000 | OROMUCOSAL | Status: DC | PRN
  Filled 2018-08-09: qty 177

## 2018-08-09 MED ORDER — TOCILIZUMAB 400 MG/20ML IV SOLN
8.0000 mg/kg | Freq: Once | INTRAVENOUS | Status: AC
  Administered 2018-08-09: 624 mg via INTRAVENOUS
  Filled 2018-08-09: qty 31.2

## 2018-08-09 MED ORDER — HYDROCORTISONE 1 % EX CREA
1.0000 "application " | TOPICAL_CREAM | Freq: Three times a day (TID) | CUTANEOUS | Status: DC | PRN
  Filled 2018-08-09: qty 28

## 2018-08-09 MED ORDER — ALUM & MAG HYDROXIDE-SIMETH 200-200-20 MG/5ML PO SUSP
30.0000 mL | ORAL | Status: DC | PRN
  Filled 2018-08-09: qty 30

## 2018-08-09 MED ORDER — ALBUTEROL SULFATE HFA 108 (90 BASE) MCG/ACT IN AERS
1.0000 | INHALATION_SPRAY | Freq: Four times a day (QID) | RESPIRATORY_TRACT | Status: DC
  Administered 2018-08-10 (×2): 2 via RESPIRATORY_TRACT
  Filled 2018-08-09 (×2): qty 6.7

## 2018-08-09 MED ORDER — SODIUM CHLORIDE 0.9 % IV SOLN
INTRAVENOUS | Status: DC | PRN
  Administered 2018-08-09 – 2018-08-10 (×2): 1000 mL via INTRAVENOUS

## 2018-08-09 MED ORDER — HYDROXYCHLOROQUINE SULFATE 200 MG PO TABS: 200.0000 mg | ORAL_TABLET | Freq: Two times a day (BID) | ORAL | Status: DC

## 2018-08-09 MED ORDER — GUAIFENESIN-DM 100-10 MG/5ML PO SYRP
5.0000 mL | ORAL_SOLUTION | ORAL | Status: DC | PRN
  Filled 2018-08-09: qty 5

## 2018-08-09 MED ORDER — POLYETHYLENE GLYCOL 3350 17 G PO PACK: 17.0000 g | PACK | Freq: Every day | ORAL | Status: DC | PRN

## 2018-08-09 MED ORDER — ZOLPIDEM TARTRATE 5 MG PO TABS
5.0000 mg | ORAL_TABLET | Freq: Every evening | ORAL | Status: DC | PRN
  Filled 2018-08-09: qty 1

## 2018-08-09 MED ORDER — LIP MEDEX EX OINT
1.0000 "application " | TOPICAL_OINTMENT | CUTANEOUS | Status: DC | PRN
  Filled 2018-08-09 (×2): qty 7

## 2018-08-09 MED ORDER — PROMETHAZINE HCL 25 MG PO TABS: 12.5000 mg | ORAL_TABLET | Freq: Four times a day (QID) | ORAL | Status: DC | PRN

## 2018-08-09 MED ORDER — HYDROCODONE-ACETAMINOPHEN 5-325 MG PO TABS: 1.0000 | ORAL_TABLET | ORAL | Status: DC | PRN

## 2018-08-09 MED ORDER — ACETAMINOPHEN 325 MG PO TABS
650.0000 mg | ORAL_TABLET | Freq: Four times a day (QID) | ORAL | Status: DC | PRN
  Administered 2018-08-10: 650 mg via ORAL
  Filled 2018-08-09 (×2): qty 2

## 2018-08-09 MED ORDER — LORATADINE 10 MG PO TABS
10.0000 mg | ORAL_TABLET | Freq: Every day | ORAL | Status: DC | PRN
  Filled 2018-08-09: qty 1

## 2018-08-09 MED ORDER — MUSCLE RUB 10-15 % EX CREA
1.0000 "application " | TOPICAL_CREAM | CUTANEOUS | Status: DC | PRN
  Filled 2018-08-09: qty 85

## 2018-08-09 MED ORDER — POLYVINYL ALCOHOL 1.4 % OP SOLN
1.0000 [drp] | OPHTHALMIC | Status: DC | PRN
  Filled 2018-08-09: qty 15

## 2018-08-09 NOTE — Telephone Encounter (Signed)
Patient's wife came into office. Used [employer] video interpreter [phone] to help with language barrier. Wife states that patient is having swelling/pain in right lower leg and having trouble using the right side of his body (arms and legs) with weakness that started 3 days ago. Patient was not in office with wife due to him being sick and recently dx with pneumonia.  I advised her to take patient to emergency room ASAP for evaluation. Wife verbalized understanding and states she will take him. Thanks!

## 2018-08-09 NOTE — ED Notes (Signed)
Dr.Lockwood at bedside  

## 2018-08-09 NOTE — ED Provider Notes (Addendum)
[facility] [place] EMERGENCY DEPARTMENT Provider Note   CSN: [id number] Arrival date & time: 08/09/18  1545    History   Chief Complaint Chief Complaint  Patient presents with  . Shortness of Breath    HPI Mark Robles is a 68 y.o. male.     HPI Patient with multiple medical problems including prior stroke, and recent diagnosis of pneumonia now presents in respiratory distress. The patient is receiving nonrebreather mask supplemental oxygen by the time my evaluation, states that this is alone is making him feel better. He states that after beginning antibiotics about 5 days ago after being diagnosed with pneumonia he felt transiently better, but has since that time felt progressively weaker, with persistent nausea, anorexia, and increased work of breathing. No vomiting, no notable fever. History is obtained with the assistance of a translator. He does take his medication as directed, including antibiotics which were recently prescribed. Per report the patient had hypoxia, less than 70% on room air, prior to receiving supplemental oxygen.  Past Medical History:  Diagnosis Date  . Depression   . DVT (deep venous thrombosis) (HCC)   . Hyperlipidemia   . Left-sided sensory deficit present   . Seizure (HCC)   . Stroke [facility])     Patient Active Problem List   Diagnosis Date Noted  . Insomnia 04/20/2018  . Language barrier to communication 03/23/2018  . Cerebrovascular accident (CVA) (HCC) 03/23/2018  . Essential hypertension 03/23/2018  . Current mild episode of major depressive disorder without prior episode (HCC) 03/23/2018  . Vertigo 08/12/2017  . Sinus bradycardia 08/12/2017  . Dyslipidemia 08/12/2017  . Spastic hemiplegia (HCC) 06/20/2017  . 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 NO PAST SURGERIES          Home Medications    Prior to Admission  medications   Medication Sig Start Date End Date Taking? Authorizing Provider  clopidogrel (PLAVIX) 75 MG tablet Take 1 tablet (75 mg total) by mouth daily. 06/19/18   Mike Gip, FNP  famotidine (PEPCID) 20 MG tablet Take 1 tablet (20 mg total) by mouth 2 (two) times daily. 03/23/18   Mike Gip, FNP  FLUoxetine (PROZAC) 20 MG tablet TAKE 1 TABLET BY MOUTH DAILY IN THE MORNING 06/19/18   Mike Gip, FNP  fluticasone [facility]) 50 MCG/ACT nasal spray Place 2 sprays into both nostrils daily. 10/26/17   Mike Gip, FNP  hydrOXYzine (ATARAX/VISTARIL) 10 MG tablet Take 1 tablet (10 mg total) by mouth 3 (three) times daily as needed. 06/19/18   Mike Gip, FNP  levofloxacin (LEVAQUIN) 750 MG tablet Take 1 tablet (750 mg total) by mouth daily. 08/02/18   Cathie Hoops, Amy V, PA-C  loperamide (IMODIUM) 2 MG capsule TAKE 2 CAPSULES BY MOUTH INITIALLY THEN 1 CAPSULE AFTER EACH LOOSE STOOL (DO NOT EXCEED 8 CAPSULES IN 24 HOURS) 10/10/17   [provider]  losartan (COZAAR) 50 MG tablet Take 1 tablet (50 mg total) by mouth daily. 06/19/18   Mike Gip, FNP  meclizine (ANTIVERT) 25 MG tablet TAKE 1 TABLET BY MOUTH 3 TIMES DAILY AS NEEDED FOR DIZZINESS. 03/30/18   Mike Gip, FNP  Olopatadine HCl 0.2 % SOLN APPLY 1 DROP TO EYE DAILY AS NEEDED (ITCHING EYES). 07/24/18   Mike Gip, FNP  rosuvastatin (CRESTOR) 20 MG tablet Take 1 tablet (20 mg total) by mouth daily. 03/23/18   Mike Gip, FNP  traZODone (DESYREL) 50 MG tablet  Take 0.5-1 tablets (25-50 mg total) by mouth at bedtime as needed for sleep. 04/20/18   Mike Gipouglas, Andre, FNP    Family History Family History  Problem Relation Age of Onset  . Hypertension Mother        tachycardia, pacemaker  . Hypertension Sister   . Hypertension Brother   . Hypertension Maternal Uncle     Social History Social History   Tobacco Use  . Smoking status: Never Smoker  . Smokeless tobacco: Never Used  Substance Use Topics  . Alcohol use: No     Frequency: Never  . Drug use: No     Allergies   Amlodipine   Review of Systems Review of Systems  Constitutional:       Per HPI, otherwise negative  HENT:       Per HPI, otherwise negative  Respiratory:       Per HPI, otherwise negative  Cardiovascular:       Per HPI, otherwise negative  Gastrointestinal: Positive for nausea. Negative for vomiting.  Endocrine:       Negative aside from HPI  Genitourinary:       Neg aside from HPI   Musculoskeletal:       Per HPI, otherwise negative  Skin: Negative.   Allergic/Immunologic: Negative for immunocompromised state.  Neurological: Positive for weakness. Negative for syncope.     Physical Exam Updated Vital Signs BP 112/78   Pulse 77   Resp (!) 36   SpO2 97%   Physical Exam Vitals signs and nursing note reviewed.  Constitutional:      Appearance: He is ill-appearing and diaphoretic.  HENT:     Head: Normocephalic and atraumatic.  Eyes:     Conjunctiva/sclera: Conjunctivae normal.  Cardiovascular:     Rate and Rhythm: Regular rhythm. Tachycardia present.  Pulmonary:     Effort: Tachypnea and respiratory distress present.     Breath sounds: Decreased breath sounds present.  Abdominal:     General: There is no distension.  Skin:    General: Skin is warm.  Neurological:     Mental Status: He is alert and oriented to person, place, and time.     Motor: Weakness present.     Comments: Hemiplegia, patient notes prior stroke.      ED Treatments / Results  Labs (all labs ordered are listed, but only abnormal results are displayed) Labs Reviewed  SARS CORONAVIRUS 2 (HOSPITAL ORDER, PERFORMED IN [facility]) - Abnormal; Notable for the following components:      Result Value   SARS Coronavirus 2 POSITIVE (*)    All other components within normal limits  COMPREHENSIVE METABOLIC PANEL - Abnormal; Notable for the following components:   Sodium 131 (*)    CO2 21 (*)    Glucose, Bld 163 (*)    Calcium  8.3 (*)    Total Protein 6.4 (*)    Albumin 2.3 (*)    AST 180 (*)    ALT 191 (*)    All other components within normal limits  CBC WITH DIFFERENTIAL/PLATELET - Abnormal; Notable for the following components:   RBC 2.46 (*)    Hemoglobin 7.9 (*)    HCT 23.4 (*)    Abs Immature Granulocytes 0.10 (*)    All other components within normal limits  LACTIC ACID, PLASMA - Abnormal; Notable for the following components:   Lactic Acid, Venous 2.4 (*)    All other components within normal limits  CULTURE, BLOOD (ROUTINE  X 2)  CULTURE, BLOOD (ROUTINE X 2)  LACTIC ACID, PLASMA    EKG EKG Interpretation  Date/Time:  Wednesday August 09 2018 15:55:51 EDT Ventricular Rate:  85 PR Interval:    QRS Duration: 88 QT Interval:  365 QTC Calculation: 434 R Axis:   49 Text Interpretation:  Sinus rhythm unremarkable ECG Confirmed by Gerhard Munch [phone]) on 08/09/2018 3:58:23 PM   Radiology Dg Chest Port 1 View  Result Date: 08/09/2018 CLINICAL DATA:  Pneumonia EXAM: PORTABLE CHEST 1 VIEW COMPARISON:  08/01/2018 FINDINGS: Worsening right lower lobe airspace disease and left lower lobe airspace disease. Patchy areas of right upper lobe airspace disease. No pleural effusion or pneumothorax. Stable cardiomediastinal silhouette. No acute osseous abnormality. IMPRESSION: 1. Worsening bilateral lower lobe pneumonia. Patchy areas of right upper lobe airspace disease concerning for pneumonia. Electronically Signed   By: Elige Ko   On: 08/09/2018 16:24    Procedures Procedures (including critical care time)  Medications Ordered in ED Medications - No data to display   Initial Impression / Assessment and Plan / ED Course  I have reviewed the triage vital signs and the nursing notes.  Pertinent labs & imaging results that were available during my care of the patient were reviewed by me and considered in my medical decision making (see chart for details).    On repeat exam the patient is  substantially more calm, now with tachypnea only moderate, upper 20s, saturation 100% on 12, nonrebreather mask. X-ray concerning for pneumonia consistent with immediate suspicion for novel coronavirus infection.     6:19 PM Coronavirus test positive, consistent with the patient's presentation. He has improved substantially, clinically after initial hypoxia, now with nonrebreather mask. Patient has no leukocytosis, and there is suspicion for viral pneumonia, additional antibiotics not initially provided. Patient's case discussed with our hospitalist colleagues for admission.  Mark Robles was evaluated in Emergency Department on 08/09/2018 for the symptoms described in the history of present illness. He was evaluated in the context of the global COVID-19 pandemic, which necessitated consideration that the patient might be at risk for infection with the SARS-CoV-2 virus that causes COVID-19. Institutional protocols and algorithms that pertain to the evaluation of patients at risk for COVID-19 are in a state of rapid change based on information released by regulatory bodies including the CDC and federal and state organizations. These policies and algorithms were followed during the patient's care in the ED. 6:55 PM I discussed the patient's findings with family members via telephone, with translator available. They are aware of code positive status, need for hospitalization.  They have some concerns about the patient's history, and I reiterated that given the patient's admission, his history will be considered, as he receives therapy for coronavirus infection. Specific concern is in regards to the patient's history of DVT, stroke. Patient is seemingly taking his Plavix and other medication as directed.  Final Clinical Impressions(s) / ED Diagnoses  Respiratory distress Pneumonia due to COVID  CRITICAL CARE Performed by: Gerhard Munch Total critical care time: 45 minutes  Critical care time was exclusive of separately billable procedures and treating other patients. Critical care was necessary to treat or prevent imminent or life-threatening deterioration. Critical care was time spent personally by me on the following activities: development of treatment plan with patient and/or surrogate as well as nursing, discussions with consultants, evaluation of patient's response to treatment, examination of patient, obtaining history from patient or surrogate, ordering and performing treatments and interventions, ordering and review of  laboratory studies, ordering and review of radiographic studies, pulse oximetry and re-evaluation of patient's condition.    Gerhard Munch, MD 08/09/18 Zollie Pee    Gerhard Munch, MD 08/09/18 [phone]

## 2018-08-09 NOTE — Progress Notes (Addendum)
Messaged Dr. Nelson Chimes to clarify if to give Plaquenil in light of Actemra order.  Ordered DO NOT GIVE PLAQUENIL. GIVE ACTEMRA.  DISCONTINUE PLAQUENIL.

## 2018-08-09 NOTE — H&P (Signed)
History and Physical    Mark Robles GUR:[id number] DOB: 1950/12/15 DOA: 08/09/2018  PCP: Lanae Boast, FNP Patient coming from: Home  Chief Complaint: Shortness of breath  HPI: Mark Robles is a 68 y.o. male with medical history significant of essential hypertension, CVA, hyperlipidemia came to the hospital with feeling of subjective fevers, chills and cough.  Interpreter used, Spanish-speaking patient. Patient has been experiencing cough, weakness, fevers and atypical chest discomfort for the past 7-8 days.  For this he visited urgent care on 08/02/2018 and was diagnosed with pneumonia therefore discharged on Levaquin.  Since then patient has not felt much better and matter fact this is worsened over past last 2 days.  Today he felt worse with slight left lower extremity weakness therefore EMS was called and brought to the hospital for further care and evaluation.  In the ER patient was tachypneic with respiratory rate greater than 30 and hypoxic on room air requiring 15 L of nonrebreather.  Chest x-ray was consistent with infiltrate.  COVID test was positive.  Medical team was requested to admit the patient.  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  Apparently she has had known her high suspicion COVID contacts.  She went with similar symptoms to her physician and was diagnosed with pneumonia.  Since then their son and son-in-law has also felt the same symptoms but none has been tested and diagnosed for COVID.  Son-in-law and son both are doing better at moment.   Eventually patient's inflammatory marker resulted in but elevated along with d-dimer.  After discussion with the patient using the interpreter line and the daughter, it was decided to start the patient on Actemera.  They both are aware this is off  label use.  Currently does not meet any exclusion criteria besides elevated LFTs which are slightly below the cutoff for the exclusion criteria.    Fever: YES  Cough: YES  SOB: YES  URI symptoms: YES  GI symptoms: NONE  Travel: NONE  Sick contacts: YES= WIFE, SON AND SON IN LAW   CBC: NO  BMP: NO  LFTs: YES (X4 UPPER LIMIT)  CRP, LDH: increased = YES  Procalcitonin: 0.45  CXR: hazy bilateral peripheral opacities = YES   Review of Systems: As per HPI otherwise 10 point review of systems negative.  Review of Systems Otherwise negative except as per HPI, including: General: Denies night sweats or unintended weight loss. Resp: Denies hemoptysis Cardiac: Denies chest pain, palpitations, orthopnea, paroxysmal nocturnal dyspnea. GI: Denies abdominal pain, nausea, vomiting, diarrhea or constipation GU: Denies dysuria, frequency, hesitancy or incontinence MS: Denies muscle aches, joint pain or swelling Neuro: Denies headache, neurologic deficits (focal weakness, numbness, tingling), abnormal gait Psych: Denies anxiety, depression, SI/HI/AVH Skin: Denies new rashes or lesions ID: Denies sick contacts, exotic exposures, travel  Past Medical History:  Diagnosis Date  . Depression   . DVT (deep venous thrombosis) ([place])   . Hyperlipidemia   . Left-sided sensory deficit present   . Seizure ([place])   . Stroke [facility])     Past Surgical History:  Procedure Laterality Date  . NO PAST SURGERIES      SOCIAL HISTORY:  reports that he has never smoked. He has never used smokeless tobacco. He reports that he does not drink alcohol or use drugs.  Allergies  Allergen Reactions  .  Amlodipine Swelling    FAMILY HISTORY: Family History  Problem Relation Age of Onset  . Hypertension Mother        tachycardia, pacemaker  . Hypertension Sister   . Hypertension Brother   . Hypertension Maternal Uncle      Prior to Admission medications   Medication Sig Start Date End Date  Taking? Authorizing Provider  clopidogrel (PLAVIX) 75 MG tablet Take 1 tablet (75 mg total) by mouth daily. 06/19/18   Lanae Boast, FNP  famotidine (PEPCID) 20 MG tablet Take 1 tablet (20 mg total) by mouth 2 (two) times daily. 03/23/18   Lanae Boast, FNP  FLUoxetine (PROZAC) 20 MG tablet TAKE 1 TABLET BY MOUTH DAILY IN THE MORNING 06/19/18   Lanae Boast, FNP  fluticasone [facility]) 50 MCG/ACT nasal spray Place 2 sprays into both nostrils daily. 10/26/17   Lanae Boast, FNP  hydrOXYzine (ATARAX/VISTARIL) 10 MG tablet Take 1 tablet (10 mg total) by mouth 3 (three) times daily as needed. 06/19/18   Lanae Boast, FNP  levofloxacin (LEVAQUIN) 750 MG tablet Take 1 tablet (750 mg total) by mouth daily. 08/02/18   Tasia Catchings, Amy V, PA-C  loperamide (IMODIUM) 2 MG capsule TAKE 2 CAPSULES BY MOUTH INITIALLY THEN 1 CAPSULE AFTER EACH LOOSE STOOL (DO NOT EXCEED 8 CAPSULES IN 24 HOURS) 10/10/17   [provider]  losartan (COZAAR) 50 MG tablet Take 1 tablet (50 mg total) by mouth daily. 06/19/18   Lanae Boast, FNP  meclizine (ANTIVERT) 25 MG tablet TAKE 1 TABLET BY MOUTH 3 TIMES DAILY AS NEEDED FOR DIZZINESS. 03/30/18   Lanae Boast, FNP  Olopatadine HCl 0.2 % SOLN APPLY 1 DROP TO EYE DAILY AS NEEDED (ITCHING EYES). 07/24/18   Lanae Boast, FNP  rosuvastatin (CRESTOR) 20 MG tablet Take 1 tablet (20 mg total) by mouth daily. 03/23/18   Lanae Boast, FNP  traZODone (DESYREL) 50 MG tablet Take 0.5-1 tablets (25-50 mg total) by mouth at bedtime as needed for sleep. 04/20/18   Lanae Boast, FNP    Physical Exam: Vitals:   08/09/18 2015 08/09/18 2030 08/09/18 2032 08/09/18 2136  BP: 131/90 124/82  127/87  Pulse: 66 69  71  Resp: (!) 27 (!) 30  (!) 37  Temp:   98.4 F (36.9 C) 98.8 F (37.1 C)  TempSrc:   Oral Oral  SpO2: 97% 98%  99%  Weight:    77.9 kg      Constitutional: NAD, calm, comfortable, on nonrebreather 13 L nasal cannula.  Slight distress due to tachypnea Eyes: PERRL, lids and  conjunctivae normal ENMT: Mucous membranes are moist. Posterior pharynx clear of any exudate or lesions.Normal dentition.  Neck: normal, supple, no masses, no thyromegaly Respiratory: Bibasilar coarse breath sounds with tachypnea. Cardiovascular: Regular rate and rhythm, no murmurs / rubs / gallops. No extremity edema. 2+ pedal pulses. No carotid bruits.  Abdomen: no tenderness, no masses palpated. No hepatosplenomegaly. Bowel sounds positive.  Musculoskeletal: no clubbing / cyanosis. No joint deformity upper and lower extremities. Good ROM, no contractures. Normal muscle tone.  Skin: no rashes, lesions, ulcers. No induration Neurologic: CN 2-12 grossly intact. Sensation intact, DTR normal. Strength 5/5 in all 4.  Psychiatric: Normal judgment and insight. Alert and oriented x 3. Normal mood.     Labs on Admission: I have personally reviewed following labs and imaging studies  CBC: Recent Labs  Lab 08/09/18 1604 08/09/18 2023  WBC 8.8 6.3  NEUTROABS 7.2 5.0  HGB 7.9* 13.7  HCT 23.4* 39.6  MCV 95.1 90.6  PLT 224 382   Basic Metabolic Panel: Recent Labs  Lab 08/09/18 1604 08/09/18 2023  NA 131*  --   K 4.5  --   CL 100  --   CO2 21*  --   GLUCOSE 163*  --   BUN 11  --   CREATININE 0.85 0.90  CALCIUM 8.3*  --    GFR: Estimated Creatinine Clearance: 77.2 mL/min (by C-G formula based on SCr of 0.9 mg/dL). Liver Function Tests: Recent Labs  Lab 08/09/18 1604  AST 180*  ALT 191*  ALKPHOS 99  BILITOT 0.7  PROT 6.4*  ALBUMIN 2.3*   No results for input(s): LIPASE, AMYLASE in the last 168 hours. No results for input(s): AMMONIA in the last 168 hours. Coagulation Profile: No results for input(s): INR, PROTIME in the last 168 hours. Cardiac Enzymes: Recent Labs  Lab 08/09/18 2023  TROPONINI <0.03   BNP (last 3 results) No results for input(s): PROBNP in the last 8760 hours. HbA1C: No results for input(s): HGBA1C in the last 72 hours. CBG: No results for  input(s): GLUCAP in the last 168 hours. Lipid Profile: Recent Labs    08/09/18 2024  TRIG 101   Thyroid Function Tests: No results for input(s): TSH, T4TOTAL, FREET4, T3FREE, THYROIDAB in the last 72 hours. Anemia Panel: Recent Labs    08/09/18 2023  FERRITIN 679*   Urine analysis:    Component Value Date/Time   BILIRUBINUR negative 07/13/2018 0851   BILIRUBINUR neg 10/26/2017 1112   KETONESUR negative 07/13/2018 0851   PROTEINUR negative 07/13/2018 0851   PROTEINUR Negative 10/26/2017 1112   UROBILINOGEN 0.2 07/13/2018 0851   NITRITE Negative 07/13/2018 0851   NITRITE neg 10/26/2017 1112   LEUKOCYTESUR Negative 07/13/2018 0851   Sepsis Labs: !!!!!!!!!!!!!!!!!!!!!!!!!!!!!!!!!!!!!!!!!!!! _0 (procalcitonin:4,lacticidven:4) ) Recent Results (from the past 240 hour(s))  SARS Coronavirus 2 [facility] order, Performed in [place] hospital lab)     Status: Abnormal   Collection Time: 08/09/18  4:11 PM  Result Value Ref Range Status   SARS Coronavirus 2 POSITIVE (A) NEGATIVE Final    Comment: RESULT CALLED TO, READ BACK BY AND VERIFIED WITH: Staley [id number] 08/09/18 A BROWNING (NOTE) If result is NEGATIVE SARS-CoV-2 target nucleic acids are NOT DETECTED. The SARS-CoV-2 RNA is generally detectable in upper and lower  respiratory specimens during the acute phase of infection. The lowest  concentration of SARS-CoV-2 viral copies this assay can detect is 250  copies / mL. A negative result does not preclude SARS-CoV-2 infection  and should not be used as the sole basis for treatment or other  patient management decisions.  A negative result may occur with  improper specimen collection / handling, submission of specimen other  than nasopharyngeal swab, presence of viral mutation(s) within the  areas targeted by this assay, and inadequate number of viral copies  (<250 copies / mL). A negative result must be combined with clinical  observations, patient history, and  epidemiological information. If result is POSITIVE SARS-CoV-2 target nucleic acids are DETECTED.  The SARS-CoV-2 RNA is generally detectable in upper and lower  respiratory specimens during the acute phase of infection.  Positive  results are indicative of active infection with SARS-CoV-2.  Clinical  correlation with patient history and other diagnostic information is  necessary to determine patient infection status.  Positive results do  not rule out bacterial infection or co-infection with other viruses. If result is PRESUMPTIVE POSTIVE SARS-CoV-2 nucleic acids MAY BE PRESENT.  A presumptive positive result was obtained on the submitted specimen  and confirmed on repeat testing.  While 2019 novel coronavirus  (SARS-CoV-2) nucleic acids may be present in the submitted sample  additional confirmatory testing may be necessary for epidemiological  and / or clinical management purposes  to differentiate between  SARS-CoV-2 and other Sarbecovirus currently known to infect humans.  If clinically indicated additional testing with an alternate test  methodology [phone])  is advised. The SARS-CoV-2 RNA is generally  detectable in upper and lower respiratory specimens during the acute  phase of infection. The expected result is Negative. Fact Sheet for Patients:  StrictlyIdeas.no Fact Sheet for Healthcare Providers: BankingDealers.co.za This test is not yet approved or cleared by the [place] FDA and has been authorized for detection and/or diagnosis of SARS-CoV-2 by FDA under an Emergency Use Authorization (EUA).  This EUA will remain in effect (meaning this test can be used) for the duration of the COVID-19 declaration under Section 564(b)(1) of the Act, 21 U.S.C. section 360bbb-3(b)(1), unless the authorization is terminated or revoked sooner. Performed at [facility], [place] [address] [place], [place] [address]       Radiological Exams on Admission: Dg Chest Port 1 View  Result Date: 08/09/2018 CLINICAL DATA:  Pneumonia EXAM: PORTABLE CHEST 1 VIEW COMPARISON:  08/01/2018 FINDINGS: Worsening right lower lobe airspace disease and left lower lobe airspace disease. Patchy areas of right upper lobe airspace disease. No pleural effusion or pneumothorax. Stable cardiomediastinal silhouette. No acute osseous abnormality. IMPRESSION: 1. Worsening bilateral lower lobe pneumonia. Patchy areas of right upper lobe airspace disease concerning for pneumonia. Electronically Signed   By: Kathreen Devoid   On: 08/09/2018 16:24     All images have been reviewed by me personally.  EKG: Independently reviewed.  Nonischemic  Assessment/Plan Principal Problem:   COVID-19 Active Problems:   Dyslipidemia   Cerebrovascular accident (CVA) ([place])   Essential hypertension   Acute respiratory disease due to COVID-19 virus   Acute Respiratory Distress due to COVID 19 Significant hypoxia due to bilateral infiltrate - Patient admitted to progressive care unit, low threshold to transfer to ICU in case if his oxygen status worsens - COVID order set used.  Elevated inflammatory markers including d-dimer.  Will use Lovenox 1 mg/kg every 12 hours. - I have discussed the risk and benefit of total resume of therapy with the patient and the family using the interpreter line, they would like to proceed with the treatment.  Exclusion criteria have been discussed.  LFTs elevated about 4 times normal upper limit (not 5 times), will continue to trend this.  No obvious other contraindications. Appreciate Dr Doreene Burke input from critical care- recommend obtain baseline ABG for now. Although not emergent I have advised nursing staff to get ABG next time they enter the room (unless things change and we need it urgently).  - EKG-normal QTC. Prone Positioning 2-3 hrs every 12 hrs (per cone guidelines). Will obtain baseline ABG - LDH 416, CRP 22.2, d-dimer  greater than 20, fibrinogen 75, procalcitonin 0.45, triglycerides 101,BNP 13.4 ESR 85, ferritin 679, platelets 168. - Per guidelines, daily labs have been ordered. -If continues to deteriorate, will add Solu-Medrol 40 mg IV every 8 hours.  History of CVA -On Plavix and Crestor at home.  Will resume once med rec has been completed.  Essential hypertension -Losartan on hold.  Treat with IV medication as necessary.  Interpreter Use: [id number]  DVT prophylaxis: Lovenox 1 mg/kg Code Status: Full  code Family Communication: Spoke with the patient's daughter over the phone Disposition Plan: Progressive care unit, low threshold to ICU.  Awaiting bed at [facility] called: Case discussed with Dr Lucile Shutters. Will be available further if necessary.  Admission status: Inpatient admission to progressive care unit   Time Spent: 65 minutes.  >50% of the time was devoted to discussing the patients care, assessment, plan and disposition with other care givers along with counseling the patient about the risks and benefits of treatment.    Ankit Arsenio Loader MD [facility]  If 7PM-7AM, please contact night-coverage [email or web address]  08/09/2018, 10:09 PM

## 2018-08-09 NOTE — ED Triage Notes (Signed)
Per [facility]: Pt arrives on NRB. Pt breathing about 60 times a minute. Pt dx with pna a week ago. Right posterior thigh pain. 76% on room air. RR did not decrease with NRB application. Pt finished his abx. Pt take losartin. Pt on plavix. Rales present throughout.   22 L AC  Temp 99.0  NSR on monitor.

## 2018-08-10 ENCOUNTER — Inpatient Hospital Stay (HOSPITAL_COMMUNITY): Payer: HRSA Program

## 2018-08-10 ENCOUNTER — Encounter (HOSPITAL_COMMUNITY): Payer: Self-pay

## 2018-08-10 ENCOUNTER — Other Ambulatory Visit: Payer: Self-pay

## 2018-08-10 DIAGNOSIS — J80 Acute respiratory distress syndrome: Secondary | ICD-10-CM

## 2018-08-10 DIAGNOSIS — J988 Other specified respiratory disorders: Secondary | ICD-10-CM

## 2018-08-10 LAB — TRIGLYCERIDES: Triglycerides: 90 mg/dL (ref <150)

## 2018-08-10 LAB — COMPREHENSIVE METABOLIC PANEL
ALT: 166 U/L — ABNORMAL HIGH (ref 0–44)
AST: 152 U/L — ABNORMAL HIGH (ref 15–41)
Albumin: 2.2 g/dL — ABNORMAL LOW (ref 3.5–5.0)
Alkaline Phosphatase: 91 U/L (ref 38–126)
Anion gap: 9 (ref 5–15)
BUN: 12 mg/dL (ref 8–23)
CO2: 22 mmol/L (ref 22–32)
Calcium: 8.2 mg/dL — ABNORMAL LOW (ref 8.9–10.3)
Chloride: 100 mmol/L (ref 98–111)
Creatinine, Ser: 0.75 mg/dL (ref 0.61–1.24)
GFR calc Af Amer: >60 mL/min (ref >60)
GFR calc non Af Amer: >60 mL/min (ref >60)
Glucose, Bld: 122 mg/dL — ABNORMAL HIGH (ref 70–99)
Potassium: 4.7 mmol/L (ref 3.5–5.1)
Sodium: 131 mmol/L — ABNORMAL LOW (ref 135–145)
Total Bilirubin: 0.9 mg/dL (ref 0.3–1.2)
Total Protein: 6.3 g/dL — ABNORMAL LOW (ref 6.5–8.1)

## 2018-08-10 LAB — BLOOD GAS, ARTERIAL
Acid-base deficit: 1.5 mmol/L (ref 0.0–2.0)
Acid-base deficit: 5.1 mmol/L — ABNORMAL HIGH (ref 0.0–2.0)
Bicarbonate: 21.5 mmol/L (ref 20.0–28.0)
Bicarbonate: 19.9 mmol/L — ABNORMAL LOW (ref 20.0–28.0)
Drawn by: 202031
FIO2: 100
FIO2: 70
MECHVT: 420 mL
O2 Saturation: 93.4 %
O2 Saturation: 96.8 %
PEEP: 12 cmH2O
Patient temperature: 98.6
Patient temperature: 98.6
RATE: 16 resp/min
pCO2 arterial: 28.9 mmHg — ABNORMAL LOW (ref 32.0–48.0)
pCO2 arterial: 39.6 mmHg (ref 32.0–48.0)
pH, Arterial: 7.484 — ABNORMAL HIGH (ref 7.350–7.450)
pH, Arterial: 7.321 — ABNORMAL LOW (ref 7.350–7.450)
pO2, Arterial: 68.7 mmHg — ABNORMAL LOW (ref 83.0–108.0)
pO2, Arterial: 109 mmHg — ABNORMAL HIGH (ref 83.0–108.0)

## 2018-08-10 LAB — POCT I-STAT 7, (LYTES, BLD GAS, ICA,H+H)
Acid-base deficit: 4 mmol/L — ABNORMAL HIGH (ref 0.0–2.0)
Bicarbonate: 21.4 mmol/L (ref 20.0–28.0)
Calcium, Ion: 1.14 mmol/L — ABNORMAL LOW (ref 1.15–1.40)
HCT: 35 % — ABNORMAL LOW (ref 39.0–52.0)
Hemoglobin: 11.9 g/dL — ABNORMAL LOW (ref 13.0–17.0)
O2 Saturation: 94 %
Patient temperature: 97.3
Potassium: 3.9 mmol/L (ref 3.5–5.1)
Sodium: 134 mmol/L — ABNORMAL LOW (ref 135–145)
TCO2: 23 mmol/L (ref 22–32)
pCO2 arterial: 37.8 mmHg (ref 32.0–48.0)
pH, Arterial: 7.358 (ref 7.350–7.450)
pO2, Arterial: 71 mmHg — ABNORMAL LOW (ref 83.0–108.0)

## 2018-08-10 LAB — CBC WITH DIFFERENTIAL/PLATELET
Abs Immature Granulocytes: 0.06 10*3/uL (ref 0.00–0.07)
Basophils Absolute: 0 10*3/uL (ref 0.0–0.1)
Basophils Relative: 0 %
Eosinophils Absolute: 0.1 10*3/uL (ref 0.0–0.5)
Eosinophils Relative: 2 %
HCT: 39.1 % (ref 39.0–52.0)
Hemoglobin: 13.6 g/dL (ref 13.0–17.0)
Immature Granulocytes: 1 %
Lymphocytes Relative: 10 %
Lymphs Abs: 0.6 10*3/uL — ABNORMAL LOW (ref 0.7–4.0)
MCH: 30.9 pg (ref 26.0–34.0)
MCHC: 34.8 g/dL (ref 30.0–36.0)
MCV: 88.9 fL (ref 80.0–100.0)
Monocytes Absolute: 0.3 10*3/uL (ref 0.1–1.0)
Monocytes Relative: 4 %
Neutro Abs: 5.4 10*3/uL (ref 1.7–7.7)
Neutrophils Relative %: 83 %
Platelets: 181 10*3/uL (ref 150–400)
RBC: 4.4 MIL/uL (ref 4.22–5.81)
RDW: 13.3 % (ref 11.5–15.5)
WBC: 6.5 10*3/uL (ref 4.0–10.5)
nRBC: 0 % (ref 0.0–0.2)

## 2018-08-10 LAB — FERRITIN: Ferritin: 542 ng/mL — ABNORMAL HIGH (ref 24–336)

## 2018-08-10 LAB — HIV ANTIBODY (ROUTINE TESTING W REFLEX): HIV Screen 4th Generation wRfx: NONREACTIVE

## 2018-08-10 LAB — PHOSPHORUS: Phosphorus: 3.3 mg/dL (ref 2.5–4.6)

## 2018-08-10 LAB — C-REACTIVE PROTEIN: CRP: 19.9 mg/dL — ABNORMAL HIGH (ref <1.0)

## 2018-08-10 LAB — LACTIC ACID, PLASMA: Lactic Acid, Venous: 1.4 mmol/L (ref 0.5–1.9)

## 2018-08-10 LAB — GLUCOSE, CAPILLARY
Glucose-Capillary: 99 mg/dL (ref 70–99)
Glucose-Capillary: 104 mg/dL — ABNORMAL HIGH (ref 70–99)
Glucose-Capillary: 132 mg/dL — ABNORMAL HIGH (ref 70–99)
Glucose-Capillary: 142 mg/dL — ABNORMAL HIGH (ref 70–99)

## 2018-08-10 LAB — CK: Total CK: 48 U/L — ABNORMAL LOW (ref 49–397)

## 2018-08-10 LAB — STREP PNEUMONIAE URINARY ANTIGEN: Strep Pneumo Urinary Antigen: NEGATIVE

## 2018-08-10 LAB — MAGNESIUM: Magnesium: 2.4 mg/dL (ref 1.7–2.4)

## 2018-08-10 LAB — MRSA PCR SCREENING: MRSA by PCR: NEGATIVE

## 2018-08-10 MED ORDER — CHLORHEXIDINE GLUCONATE 0.12% ORAL RINSE (MEDLINE KIT)
15.0000 mL | Freq: Two times a day (BID) | OROMUCOSAL | Status: DC
  Administered 2018-08-10 – 2018-08-13 (×6): 15 mL via OROMUCOSAL

## 2018-08-10 MED ORDER — SODIUM CHLORIDE 0.9 % IV SOLN: 500.0000 mg | INTRAVENOUS | Status: DC

## 2018-08-10 MED ORDER — ACETAMINOPHEN 160 MG/5ML PO SOLN: 325.0000 mg | Freq: Four times a day (QID) | ORAL | Status: DC | PRN

## 2018-08-10 MED ORDER — ORAL CARE MOUTH RINSE
15.0000 mL | OROMUCOSAL | Status: DC
  Administered 2018-08-10 – 2018-08-13 (×27): 15 mL via OROMUCOSAL

## 2018-08-10 MED ORDER — MIDAZOLAM HCL 2 MG/2ML IJ SOLN
INTRAMUSCULAR | Status: AC
  Administered 2018-08-10: 2 mg via INTRAVENOUS
  Filled 2018-08-10: qty 2

## 2018-08-10 MED ORDER — ROCURONIUM BROMIDE 50 MG/5ML IV SOLN
80.0000 mg | Freq: Once | INTRAVENOUS | Status: AC
  Filled 2018-08-10: qty 8

## 2018-08-10 MED ORDER — METHYLPREDNISOLONE SODIUM SUCC 40 MG IJ SOLR: 40.0000 mg | Freq: Three times a day (TID) | INTRAMUSCULAR | Status: DC

## 2018-08-10 MED ORDER — MIDAZOLAM HCL 2 MG/2ML IJ SOLN
INTRAMUSCULAR | Status: AC
  Filled 2018-08-10: qty 2

## 2018-08-10 MED ORDER — METHYLPREDNISOLONE SODIUM SUCC 40 MG IJ SOLR
40.0000 mg | Freq: Three times a day (TID) | INTRAMUSCULAR | Status: AC
  Administered 2018-08-10 (×3): 40 mg via INTRAVENOUS
  Filled 2018-08-10 (×3): qty 1

## 2018-08-10 MED ORDER — FENTANYL CITRATE (PF) 100 MCG/2ML IJ SOLN
50.0000 ug | INTRAMUSCULAR | Status: DC | PRN
  Administered 2018-08-11: 100 ug via INTRAVENOUS

## 2018-08-10 MED ORDER — SODIUM CHLORIDE 0.9 % IV SOLN
2.0000 g | INTRAVENOUS | Status: DC
  Administered 2018-08-10 – 2018-08-11 (×2): 2 g via INTRAVENOUS
  Filled 2018-08-10 (×3): qty 20

## 2018-08-10 MED ORDER — ONDANSETRON HCL 4 MG/2ML IJ SOLN
4.0000 mg | Freq: Three times a day (TID) | INTRAMUSCULAR | Status: DC | PRN
  Administered 2018-08-13 – 2018-08-16 (×4): 4 mg via INTRAVENOUS
  Filled 2018-08-10 (×4): qty 2

## 2018-08-10 MED ORDER — FENTANYL BOLUS VIA INFUSION
50.0000 ug | INTRAVENOUS | Status: DC | PRN
  Administered 2018-08-11 – 2018-08-12 (×2): 50 ug via INTRAVENOUS
  Filled 2018-08-10: qty 50

## 2018-08-10 MED ORDER — SODIUM CHLORIDE 0.9 % IV SOLN
0.5000 mg/kg/h | INTRAVENOUS | Status: DC
  Administered 2018-08-10 – 2018-08-11 (×4): 0.5 mg/kg/h via INTRAVENOUS
  Filled 2018-08-10 (×9): qty 5

## 2018-08-10 MED ORDER — INSULIN ASPART 100 UNIT/ML ~~LOC~~ SOLN
0.0000 [IU] | SUBCUTANEOUS | Status: DC
  Administered 2018-08-10 (×2): 1 [IU] via SUBCUTANEOUS
  Administered 2018-08-11 (×2): 2 [IU] via SUBCUTANEOUS
  Administered 2018-08-11: 1 [IU] via SUBCUTANEOUS
  Administered 2018-08-11: 2 [IU] via SUBCUTANEOUS
  Administered 2018-08-11: 3 [IU] via SUBCUTANEOUS
  Administered 2018-08-11 – 2018-08-12 (×3): 1 [IU] via SUBCUTANEOUS
  Administered 2018-08-12: 3 [IU] via SUBCUTANEOUS
  Administered 2018-08-12: 1 [IU] via SUBCUTANEOUS
  Administered 2018-08-12 (×2): 2 [IU] via SUBCUTANEOUS
  Administered 2018-08-13: 1 [IU] via SUBCUTANEOUS
  Administered 2018-08-13: 2 [IU] via SUBCUTANEOUS
  Administered 2018-08-13 (×2): 1 [IU] via SUBCUTANEOUS

## 2018-08-10 MED ORDER — MIDAZOLAM BOLUS VIA INFUSION
1.0000 mg | INTRAVENOUS | Status: DC | PRN
  Administered 2018-08-11: 01:00:00 1 mg via INTRAVENOUS
  Administered 2018-08-11 – 2018-08-12 (×2): 2 mg via INTRAVENOUS
  Filled 2018-08-10: qty 2

## 2018-08-10 MED ORDER — ACETAMINOPHEN 650 MG RE SUPP: 650.0000 mg | Freq: Four times a day (QID) | RECTAL | Status: DC | PRN

## 2018-08-10 MED ORDER — SENNOSIDES 8.8 MG/5ML PO SYRP
5.0000 mL | ORAL_SOLUTION | Freq: Two times a day (BID) | ORAL | Status: DC | PRN
  Administered 2018-08-11 – 2018-08-12 (×2): 5 mL
  Filled 2018-08-10 (×2): qty 5

## 2018-08-10 MED ORDER — FENTANYL CITRATE (PF) 100 MCG/2ML IJ SOLN: 50.0000 ug | INTRAMUSCULAR | Status: DC | PRN

## 2018-08-10 MED ORDER — SODIUM CHLORIDE 0.9 % IV SOLN
500.0000 mg | INTRAVENOUS | Status: DC
  Administered 2018-08-11: 08:00:00 500 mg via INTRAVENOUS
  Filled 2018-08-10 (×2): qty 500

## 2018-08-10 MED ORDER — ENOXAPARIN SODIUM 40 MG/0.4ML ~~LOC~~ SOLN
0.5000 mg/kg | Freq: Two times a day (BID) | SUBCUTANEOUS | Status: DC
  Administered 2018-08-10 – 2018-08-16 (×12): 40 mg via SUBCUTANEOUS
  Filled 2018-08-10 (×14): qty 0.4

## 2018-08-10 MED ORDER — MIDAZOLAM HCL 2 MG/2ML IJ SOLN: 1.0000 mg | Freq: Once | INTRAMUSCULAR | Status: AC

## 2018-08-10 MED ORDER — ACETAMINOPHEN 160 MG/5ML PO SOLN: 325.0000 mg | Freq: Four times a day (QID) | ORAL | Status: DC

## 2018-08-10 MED ORDER — FAMOTIDINE IN NACL 20-0.9 MG/50ML-% IV SOLN
20.0000 mg | Freq: Two times a day (BID) | INTRAVENOUS | Status: DC
  Administered 2018-08-10 – 2018-08-12 (×6): 20 mg via INTRAVENOUS
  Filled 2018-08-10 (×8): qty 50

## 2018-08-10 MED ORDER — BISACODYL 10 MG RE SUPP: 10.0000 mg | Freq: Every day | RECTAL | Status: DC | PRN

## 2018-08-10 MED ORDER — FENTANYL CITRATE (PF) 100 MCG/2ML IJ SOLN: 50.0000 ug | Freq: Once | INTRAMUSCULAR | Status: AC

## 2018-08-10 MED ORDER — MIDAZOLAM HCL 2 MG/2ML IJ SOLN
2.0000 mg | INTRAMUSCULAR | Status: DC | PRN
  Administered 2018-08-10: 11:00:00 2 mg via INTRAVENOUS

## 2018-08-10 MED ORDER — ENSURE ENLIVE PO LIQD: 237.0000 mL | Freq: Two times a day (BID) | ORAL | Status: DC

## 2018-08-10 MED ORDER — NOREPINEPHRINE 4 MG/250ML-% IV SOLN
0.0000 ug/min | INTRAVENOUS | Status: DC
  Administered 2018-08-10: 10 ug/min via INTRAVENOUS
  Administered 2018-08-11: 3 ug/min via INTRAVENOUS
  Filled 2018-08-10 (×2): qty 250

## 2018-08-10 MED ORDER — ALBUTEROL SULFATE (2.5 MG/3ML) 0.083% IN NEBU: 2.5000 mg | INHALATION_SOLUTION | Freq: Four times a day (QID) | RESPIRATORY_TRACT | Status: DC

## 2018-08-10 MED ORDER — FENTANYL CITRATE (PF) 100 MCG/2ML IJ SOLN
INTRAMUSCULAR | Status: AC
  Filled 2018-08-10: qty 2

## 2018-08-10 MED ORDER — MIDAZOLAM 50MG/50ML (1MG/ML) PREMIX INFUSION
0.0000 mg/h | INTRAVENOUS | Status: DC
  Administered 2018-08-10: 1 mg/h via INTRAVENOUS
  Administered 2018-08-10: 12:00:00 2 mg/h via INTRAVENOUS
  Administered 2018-08-11: 3 mg/h via INTRAVENOUS
  Filled 2018-08-10 (×5): qty 50

## 2018-08-10 MED ORDER — ETOMIDATE 2 MG/ML IV SOLN: 20.0000 mg | Freq: Once | INTRAVENOUS | Status: AC

## 2018-08-10 MED ORDER — FENTANYL 2500MCG IN NS 250ML (10MCG/ML) PREMIX INFUSION
50.0000 ug/h | INTRAVENOUS | Status: DC
  Administered 2018-08-10: 10:00:00 100 ug/h via INTRAVENOUS
  Administered 2018-08-10: 200 ug/h via INTRAVENOUS
  Administered 2018-08-11: 100 ug/h via INTRAVENOUS
  Administered 2018-08-12: 75 ug/h via INTRAVENOUS
  Filled 2018-08-10 (×4): qty 250

## 2018-08-10 MED ORDER — ALBUTEROL SULFATE (2.5 MG/3ML) 0.083% IN NEBU: 2.5000 mg | INHALATION_SOLUTION | RESPIRATORY_TRACT | Status: DC | PRN

## 2018-08-10 MED ORDER — MIDAZOLAM HCL 2 MG/2ML IJ SOLN: 2.0000 mg | INTRAMUSCULAR | Status: DC | PRN

## 2018-08-10 MED ORDER — NOREPINEPHRINE 4 MG/250ML-% IV SOLN
INTRAVENOUS | Status: AC
  Filled 2018-08-10: qty 250

## 2018-08-10 NOTE — Procedures (Signed)
Intubation Procedure Note Mark Robles [id number] 1950-08-01  Procedure: Intubation Indications: Respiratory insufficiency  Procedure Details Consent: Unable to obtain consent because of emergent medical necessity. Time Out: Verified patient identification, verified procedure, site/side was marked, verified correct patient position, special equipment/implants available, medications/allergies/relevent history reviewed, required imaging and test results available.  Performed  Maximum sterile technique was used including antiseptics, cap, gloves, gown, hand hygiene, mask and sheet.  3  Using MAC 3 on Glidescope, grade I view. ETT tube visualized passing between vocal cords.  Evaluation Hemodynamic Status: Transient hypotension treated with pressors and fluid; O2 sats: stable throughout and transiently fell during during procedure Patient's Current Condition: stable Complications: No apparent complications Patient did tolerate procedure well. Chest X-ray ordered to verify placement.  CXR: pending.   Mark Robles 08/10/2018

## 2018-08-10 NOTE — Progress Notes (Addendum)
PROGRESS NOTE    Mark Robles  ZOX:[id number] DOB: 07/31/1950 DOA: 08/09/2018 PCP: Mark Gipouglas, Andre, FNP    Brief Narrative: 68 year old with past medical history significant for hypertension, CVA, hyperlipidemia who presents to the ED complaining of subjective fevers chills and cough.  Patient report cough weakness chest pain for the past 7 to 8 days prior to admission.  Patient was seen at the urgent care on 415 was diagnosed with pneumonia he was discharged on Levaquin.  He felt worsening left lower extremity weakness. In the ED he required 15 L liters of normal breather because he was hypoxic.  Chest x-ray consistent with infiltrates.  [place]owbey test was positive.   Assessment & Plan:   Principal Problem:   COVID-19 Active Problems:   Dyslipidemia   Cerebrovascular accident (CVA) (HCC)   Essential hypertension   Acute respiratory disease due to COVID-19 virus  1-Acute hypoxic respiratory failure and distress due to COVID-19 Patient with multiple comorbidities, hypertension, history of CVA, age more than 6660 -Patient presents with significant hypoxemia and bilateral infiltrates. -Patient was a started on therapeutic dose with Lovenox due to elevated inflammatory markers and elevated d-dimer.  -He received a dose of Actemra. Follow LFT, will defer further doses to CCM>   -Due to altered mental status I doubt that he is going to be able to do prone position. -He is lethargic, still very hypoxic, high risk for decompensation.  He was hypothermic this morning and hypotensive.  I have consulted CCM.  I will start IV steroids 40 mg every 8 hours for 3 days. -Continue to monitor LDH, CRP, d-dimer. -I will add azithromycin IV -Ferritin 542, CRP 19, MR more than 20, fibrinogen 705. -will schedule nebulizer.   2-History of CVA on Plavix and Crestor at home. Hold Crestor due to transaminases. Hold Plavix, he is on Lovenox.  3-Hypertension: Hold losartan.  Now with  hypotension.  4-sepsis probably related to COVID-19.  Patient with hypotension and hypothermia.  Respiratory failure. He has on [employer].  Continue with supportive care. Follow blood cultures.  Will with IV azithromycin.  5-Hyponatremia; related to hypovolemia.  Will give IV bolus as needed for hypotension.   6-Transaminases; related to viral illness. Trending down.   7-left lower extremity edema: Continue with Lovenox therapeutic dose for now. When  More stable he will need Dopplers of lower extremities  Estimated body mass index is 25.36 kg/m as calculated from the following:   Height as of this encounter: 5\' 9"  (1.753 m).   Weight as of this encounter: 77.9 kg.   DVT prophylaxis: Lovenox Code Status: Full code Family Communication: Patient transfer to ICU, he will be intubated for airway protection and hypoxemia. I have called patient wife and daughter. I informed them about next step in care, will proceed with intubation, central line. I informed them that Mr. Sherlon HandingRodriguez is in critical condition.   Disposition Plan: Remain in the hospital for treatment of severe respiratory failure and COVID-19.   Consultants:   CCM   Procedures:   None   Antimicrobials: Azithromycin  Subjective: Lethargic, open eyes to voice, fall back sleep, squeezed my finger momentarily when asked.  He denies pain.   Objective: Vitals:   08/10/18 0613 08/10/18 0614 08/10/18 0626 08/10/18 0755  BP:   (!) 89/64 92/62  Pulse: 65 (!) 59 (!) 55   Resp: (!) 30 (!) 30 20   Temp: (!) 94.5 F (34.7 C) (!) 94.6 F (34.8 C) 97.7 F (36.5 C) (!) 96.8  F (36 C)  TempSrc: Oral Axillary Rectal Axillary  SpO2: 94% 94% 95%   Weight:      Height:        Intake/Output Summary (Last 24 hours) at 08/10/2018 0814 Last data filed at 08/10/2018 0018 Gross per 24 hour  Intake 150 ml  Output -  Net 150 ml   Filed Weights   08/09/18 2136  Weight: 77.9 kg    Examination:  General exam:  Lethargic, pale.  Respiratory system: bilateral rochus Cardiovascular system: S1 & S2 heard, RRR. No JVD, murmurs, rubs, gallops or clicks. . Gastrointestinal system: Abdomen is nondistended, soft and nontender. No organomegaly or masses felt. Normal bowel sounds heard. Central nervous system: Lethargic, open eyes to voice momentarily.  Extremities:left LE with increase size compare to right  Skin: No rashes, lesions or ulcers    Data Reviewed: I have personally reviewed following labs and imaging studies  CBC: Recent Labs  Lab 08/09/18 1604 08/09/18 2023 08/10/18 0125  WBC 8.8 6.3 6.5  NEUTROABS 7.2 5.0 5.4  HGB 7.9* 13.7 13.6  HCT 23.4* 39.6 39.1  MCV 95.1 90.6 88.9  PLT 224 168 181   Basic Metabolic Panel: Recent Labs  Lab 08/09/18 1604 08/09/18 2023 08/10/18 0125  NA 131*  --  131*  K 4.5  --  4.7  CL 100  --  100  CO2 21*  --  22  GLUCOSE 163*  --  122*  BUN 11  --  12  CREATININE 0.85 0.90 0.75  CALCIUM 8.3*  --  8.2*  MG  --   --  2.4  PHOS  --   --  3.3   GFR: Estimated Creatinine Clearance: 92.1 mL/min (by C-G formula based on SCr of 0.75 mg/dL). Liver Function Tests: Recent Labs  Lab 08/09/18 1604 08/10/18 0125  AST 180* 152*  ALT 191* 166*  ALKPHOS 99 91  BILITOT 0.7 0.9  PROT 6.4* 6.3*  ALBUMIN 2.3* 2.2*   No results for input(s): LIPASE, AMYLASE in the last 168 hours. No results for input(s): AMMONIA in the last 168 hours. Coagulation Profile: No results for input(s): INR, PROTIME in the last 168 hours. Cardiac Enzymes: Recent Labs  Lab 08/09/18 2023 08/10/18 0125  CKTOTAL  --  48*  TROPONINI <0.03  --    BNP (last 3 results) No results for input(s): PROBNP in the last 8760 hours. HbA1C: No results for input(s): HGBA1C in the last 72 hours. CBG: Recent Labs  Lab 08/10/18 0759  GLUCAP 99   Lipid Profile: Recent Labs    08/09/18 2024 08/10/18 0158  TRIG 101 90   Thyroid Function Tests: No results for input(s): TSH,  T4TOTAL, FREET4, T3FREE, THYROIDAB in the last 72 hours. Anemia Panel: Recent Labs    08/09/18 2023 08/10/18 0125  FERRITIN 679* 542*   Sepsis Labs: Recent Labs  Lab 08/09/18 1621 08/09/18 1849 08/09/18 2023  PROCALCITON  --   --  0.45  LATICACIDVEN 2.4* 1.9  --     Recent Results (from the past 240 hour(s))  SARS Coronavirus 2 [facility] order, Performed in [facility] hospital lab)     Status: Abnormal   Collection Time: 08/09/18  4:11 PM  Result Value Ref Range Status   SARS Coronavirus 2 POSITIVE (A) NEGATIVE Final    Comment: RESULT CALLED TO, READ BACK BY AND VERIFIED WITH: Particia Nearing RN 1754 08/09/18 A BROWNING (NOTE) If result is NEGATIVE SARS-CoV-2 target nucleic acids are NOT DETECTED. The SARS-CoV-2  RNA is generally detectable in upper and lower  respiratory specimens during the acute phase of infection. The lowest  concentration of SARS-CoV-2 viral copies this assay can detect is 250  copies / mL. A negative result does not preclude SARS-CoV-2 infection  and should not be used as the sole basis for treatment or other  patient management decisions.  A negative result may occur with  improper specimen collection / handling, submission of specimen other  than nasopharyngeal swab, presence of viral mutation(s) within the  areas targeted by this assay, and inadequate number of viral copies  (<250 copies / mL). A negative result must be combined with clinical  observations, patient history, and epidemiological information. If result is POSITIVE SARS-CoV-2 target nucleic acids are DETECTED.  The SARS-CoV-2 RNA is generally detectable in upper and lower  respiratory specimens during the acute phase of infection.  Positive  results are indicative of active infection with SARS-CoV-2.  Clinical  correlation with patient history and other diagnostic information is  necessary to determine patient infection status.  Positive results do  not rule out bacterial infection or  co-infection with other viruses. If result is PRESUMPTIVE POSTIVE SARS-CoV-2 nucleic acids MAY BE PRESENT.   A presumptive positive result was obtained on the submitted specimen  and confirmed on repeat testing.  While 2019 novel coronavirus  (SARS-CoV-2) nucleic acids may be present in the submitted sample  additional confirmatory testing may be necessary for epidemiological  and / or clinical management purposes  to differentiate between  SARS-CoV-2 and other Sarbecovirus currently known to infect humans.  If clinically indicated additional testing with an alternate test  methodology [phone])  is advised. The SARS-CoV-2 RNA is generally  detectable in upper and lower respiratory specimens during the acute  phase of infection. The expected result is Negative. Fact Sheet for Patients:  BoilerBrush.com.cy Fact Sheet for Healthcare Providers: https://pope.com/ This test is not yet approved or cleared by the [place] FDA and has been authorized for detection and/or diagnosis of SARS-CoV-2 by FDA under an Emergency Use Authorization (EUA).  This EUA will remain in effect (meaning this test can be used) for the duration of the COVID-19 declaration under Section 564(b)(1) of the Act, 21 U.S.C. section 360bbb-3(b)(1), unless the authorization is terminated or revoked sooner. Performed at [facility], [address] [place], [place] [address]          Radiology Studies: Dg Chest Port 1 View  Result Date: 08/09/2018 CLINICAL DATA:  Pneumonia EXAM: PORTABLE CHEST 1 VIEW COMPARISON:  08/01/2018 FINDINGS: Worsening right lower lobe airspace disease and left lower lobe airspace disease. Patchy areas of right upper lobe airspace disease. No pleural effusion or pneumothorax. Stable cardiomediastinal silhouette. No acute osseous abnormality. IMPRESSION: 1. Worsening bilateral lower lobe pneumonia. Patchy areas of right upper lobe  airspace disease concerning for pneumonia. Electronically Signed   By: Elige Ko   On: 08/09/2018 16:24        Scheduled Meds: . albuterol  1-2 puff Inhalation Q6H  . enoxaparin (LOVENOX) injection  1 mg/kg Subcutaneous Q12H  . feeding supplement (ENSURE ENLIVE)  237 mL Oral BID BM  . methylPREDNISolone (SOLU-MEDROL) injection  40 mg Intravenous TID   Continuous Infusions: . sodium chloride 1,000 mL (08/09/18 2349)     LOS: 1 day    Time spent:35 minutes    Alba Cory, MD [facility] Pager [phone]  If 7PM-7AM, please contact night-coverage [email or web address] Password [facility] 08/10/2018, 8:14 AM

## 2018-08-10 NOTE — TOC Initial Note (Addendum)
Transition of Care [facility]) - Initial/Assessment Note    Patient Details  Name: Mark Robles MRN: [id number] Date of Birth: 1951/03/08  Transition of Care [facility]) CM/SW Contact:    Inis Sizer, LCSW Phone Number: 08/10/2018, 10:28 AM  Clinical Narrative:  CSW received consult for patient due to concerns with how patient obtains his medications. CSW unable to speak directly with patient due to him having an altered mental status and being COVID-19 positive. CSW spoke with patient's daughter Mark Robles and wife Mark Robles via phone to complete assessment. Per patient's daughter, the family lives in [place] and there are five total adults in the home including the patient. Patient resides with his wife, two sons (ages 26, 88) and one daughter (age 27). Patient's wife works at [employer] in [place], and that no other family member in the home has been symptomatic. Mark Robles reports that her mother Mark Robles is the patient's main caregiver but that her brothers are able to participate in his care if needed. Mark Robles reports that the patient does not have health insurance but does receive financial assistance through [employer] financial program at a 100% rate. Mark Robles reports that the financial assistance program covers her father's doctor's office visits, medications, and hospital stays. CSW explained to daughter that medications may vary whenever patient is discharged due to his current condition and illness and inquired about ability to obtain those medications. Mark Robles reports that the family is able to pay out of pocket if medications change, but was receptive to further assistance at time of discharge if those needs arise. Mark Robles stated she received a call from the hospital staff this morning to be given an update regarding patient's condition. Luna had no questions but CSW encouraged family to reach out to [facility] for assistance and updates on patient.              Expected Discharge Plan: Home/Self Care Barriers  to Discharge: Other (comment)(COVID-19 positive, currently intubated)   Patient Goals and CMS Choice   CMS Medicare.gov Compare Post Acute Care list provided to:: Other (Comment Required)(Not offered at this time, patient is intubated) Choice offered to / list presented to : NA  Expected Discharge Plan and Services Expected Discharge Plan: Home/Self Care   Discharge Planning Services: Medication Assistance Post Acute Care Choice: NA Living arrangements for the past 2 months: Single Family Home Expected Discharge Date: 08/12/18                                    Prior Living Arrangements/Services Living arrangements for the past 2 months: Single Family Home Lives with:: Adult Children, Spouse Patient language and need for interpreter reviewed:: Yes Do you feel safe going back to the place where you live?: Yes      Need for Family Participation in Patient Care: Yes (Comment) Care giver support system in place?: No (comment) Current home services: Other (comment)(N/A) Criminal Activity/Legal Involvement Pertinent to Current Situation/Hospitalization: No - Comment as needed  Activities of Daily Living Home Assistive Devices/Equipment: None ADL Screening (condition at time of admission) Patient's cognitive ability adequate to safely complete daily activities?: Yes Is the patient deaf or have difficulty hearing?: No Does the patient have difficulty seeing, even when wearing glasses/contacts?: No Does the patient have difficulty concentrating, remembering, or making decisions?: No Patient able to express need for assistance with ADLs?: No(requiresa interpreter services) Does the patient have difficulty dressing or bathing?: Yes Independently  performs ADLs?: Yes (appropriate for developmental age) Does the patient have difficulty walking or climbing stairs?: Yes Weakness of Legs: Both Weakness of Arms/Hands: None  Permission Sought/Granted Permission sought to share  information with : Family Supports Permission granted to share information with : Yes, Verbal Permission Granted  Share Information with NAME: Mark Robles  Permission granted to share info w AGENCY: Family  Permission granted to share info w Relationship: Daughter  Permission granted to share info w Contact Information: Mark MinceLuna  Emotional Assessment Appearance:: Other (Comment Required(Spoke with daugther over phone) Attitude/Demeanor/Rapport: Gracious, Engaged Affect (typically observed): Accepting, Pleasant Orientation: : Oriented to Self, Oriented to Place, Oriented to  Time, Oriented to Situation(Daughter) Alcohol / Substance Use: Not Applicable Psych Involvement: No (comment)  Admission diagnosis:  Respiratory distress [R06.03] Pneumonia due to COVID-19 virus [U07.1, J12.89] Patient Active Problem List   Diagnosis Date Noted  . COVID-19 08/09/2018  . Acute respiratory disease due to COVID-19 virus 08/09/2018  . Insomnia 04/20/2018  . Language barrier to communication 03/23/2018  . Cerebrovascular accident (CVA) (HCC) 03/23/2018  . Essential hypertension 03/23/2018  . Current mild episode of major depressive disorder without prior episode (HCC) 03/23/2018  . Vertigo 08/12/2017  . Sinus bradycardia 08/12/2017  . Dyslipidemia 08/12/2017  . Spastic hemiplegia (HCC) 06/20/2017  . 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 [place] [address] Phone: [phone] Fax: [phone]     Social Determinants of Health (SDOH) Interventions    Readmission Risk Interventions No flowsheet data found.   Edwin Dadaarol Fidela Cieslak, MSW, LCSW-A Clinical Social Worker Emergency Department [facility] [phone]

## 2018-08-10 NOTE — Consult Note (Signed)
PULMONARY / CRITICAL CARE MEDICINE   NAME:  Mark Robles, MRN:  [id number], DOB:  05/22/1950, LOS: 1 ADMISSION DATE:  08/09/2018, CONSULTATION DATE: 08/10/2018 REFERRING MD: [facility], CHIEF COMPLAINT: Respiratory failure in the setting COVID-19 positive  BRIEF HISTORY:    68-year-old Spanish-speaking male fever chills sweats COVID-19 positive HISTORY OF PRESENT ILLNESS   68 year old male with medical history significant for hypertension, CVA, hyperlipidemia who presented with fevers chills and cough.  He does not speak [place].  He was tested for COVID-19 and was positive on 08/07/2018 admitted to vascular hospital.  On 08/10/2018 worsening mental status which required intubation ventilatory support further 4. SIGNIFICANT PAST MEDICAL HISTORY   CVa Hypertension Hyperlipidemia   SIGNIFICANT EVENTS:  08/10/2018 transfer to intensive care due to increasing lethargy and inability to protect airway. STUDIES:   08/09/2018 COVID-19 positive CULTURES:    ANTIBIOTICS:    LINES/TUBES:   08/10/2018 orotracheal tube>> 05/11/2018 gastric tube>> 08/10/2018 central line>> 08/10/2018 arterial line>> CONSULTANTS:   SUBJECTIVE:  68 year old male diagnosed with COVID-19 positive on 08/09/2018.  CONSTITUTIONAL: BP 92/62 (BP Location: Left Arm)   Pulse (!) 55   Temp (!) 96.8 F (36 C) (Axillary)   Resp 20   Ht 5\' 9"  (1.753 m)   Wt 77.9 kg   SpO2 95%   BMI 25.36 kg/m   I/O last 3 completed shifts: In: 150 [P.O.:150] Out: -         PHYSICAL EXAM: General: Lethargic Hispanic male but only response to noxious stimulus, unable to phonate. Neuro: Difficult to arouse, responds only to noxious stimuli, nonverbal HEENT: Poor oral hygiene.  Oral mucosal is crusted Cardiovascular: Heart sounds are distant but regular rate and rhythm Lungs: Decreased breath sounds throughout Abdomen: Soft nontender Musculoskeletal: No acute abnormalities Skin: Warm dry  RESOLVED PROBLEM LIST    ASSESSMENT AND PLAN   Increasing respiratory failure secondary to progressive COVID-19 symptoms including increased lethargy and inability to protect airway. August 10, 2018 plan for urgent intubation Early ARDS protocol Proning as needed Central line placement Arterial line placement Serial chest x-rays Serial blood gases  Covid 19 Standard COVID 19 pharmaceutical dosing per protocol Diuresis as able  History of CVA Consideration for CT of the head with decreased level of consciousness in the near future no change in mental status  History of hypertension Hold antihypertensives for now Monitor for shock With a central line monitor CVP   SUMMARY OF TODAY'S PLAN:  68 year old male Spanish speaking COVID-19 positive resolving progressive care [facility] pulmonary critical care was called to bedside for August 10, 2018 approximately 0815 hrs. he was noted to be lethargic difficult to arouse and not protecting his airway.  Transferred to intensive care unit for impending intubation.  Best Practice / Goals of Care / Disposition.   DVT PROPHYLAXIS: Subcu heparin SUP: NUTRITION: Tube feeding restarted in future MOBILITY: Bedrest GOALS OF CARE: Currently full code FAMILY DISCUSSIONS: No family bedside will need to be called later DISPOSITION 08/10/2018 transfer to intensive care unit   LABS  Glucose Recent Labs  Lab 08/10/18 0759  GLUCAP 99    BMET Recent Labs  Lab 08/09/18 1604 08/09/18 2023 08/10/18 0125  NA 131*  --  131*  K 4.5  --  4.7  CL 100  --  100  CO2 21*  --  22  BUN 11  --  12  CREATININE 0.85 0.90 0.75  GLUCOSE 163*  --  122*    Liver Enzymes Recent Labs  Lab 08/09/18 1604 08/10/18 0125  AST 180* 152*  ALT 191* 166*  ALKPHOS 99 91  BILITOT 0.7 0.9  ALBUMIN 2.3* 2.2*    Electrolytes Recent Labs  Lab 08/09/18 1604 08/10/18 0125  CALCIUM 8.3* 8.2*  MG  --  2.4  PHOS  --  3.3    CBC Recent Labs  Lab 08/09/18 1604 08/09/18 2023  08/10/18 0125  WBC 8.8 6.3 6.5  HGB 7.9* 13.7 13.6  HCT 23.4* 39.6 39.1  PLT 224 168 181    ABG Recent Labs  Lab 08/09/18 0000  PHART 7.484*  PCO2ART 28.9*  PO2ART 68.7*    Coag's No results for input(s): APTT, INR in the last 168 hours.  Sepsis Markers Recent Labs  Lab 08/09/18 1621 08/09/18 1849 08/09/18 2023  LATICACIDVEN 2.4* 1.9  --   PROCALCITON  --   --  0.45    Cardiac Enzymes Recent Labs  Lab 08/09/18 2023  TROPONINI <0.03    PAST MEDICAL HISTORY :   He  has a past medical history of Depression, DVT (deep venous thrombosis) (HCC), Hyperlipidemia, Left-sided sensory deficit present, Seizure (HCC), and Stroke (HCC).  PAST SURGICAL HISTORY:  He  has a past surgical history that includes No past surgeries.  Allergies  Allergen Reactions  . Amlodipine Swelling    No current facility-administered medications on file prior to encounter.    Current Outpatient Medications on File Prior to Encounter  Medication Sig  . clopidogrel (PLAVIX) 75 MG tablet Take 1 tablet (75 mg total) by mouth daily.  . famotidine (PEPCID) 20 MG tablet Take 1 tablet (20 mg total) by mouth 2 (two) times daily.  Marland Kitchen FLUoxetine (PROZAC) 20 MG tablet TAKE 1 TABLET BY MOUTH DAILY IN THE MORNING  . fluticasone (FLONASE) 50 MCG/ACT nasal spray Place 2 sprays into both nostrils daily.  . hydrOXYzine (ATARAX/VISTARIL) 10 MG tablet Take 1 tablet (10 mg total) by mouth 3 (three) times daily as needed.  Marland Kitchen levofloxacin (LEVAQUIN) 750 MG tablet Take 1 tablet (750 mg total) by mouth daily.  Marland Kitchen loperamide (IMODIUM) 2 MG capsule TAKE 2 CAPSULES BY MOUTH INITIALLY THEN 1 CAPSULE AFTER EACH LOOSE STOOL (DO NOT EXCEED 8 CAPSULES IN 24 HOURS)  . losartan (COZAAR) 50 MG tablet Take 1 tablet (50 mg total) by mouth daily.  . meclizine (ANTIVERT) 25 MG tablet TAKE 1 TABLET BY MOUTH 3 TIMES DAILY AS NEEDED FOR DIZZINESS.  Marland Kitchen Olopatadine HCl 0.2 % SOLN APPLY 1 DROP TO EYE DAILY AS NEEDED (ITCHING EYES).  .  rosuvastatin (CRESTOR) 20 MG tablet Take 1 tablet (20 mg total) by mouth daily.  . traZODone (DESYREL) 50 MG tablet Take 0.5-1 tablets (25-50 mg total) by mouth at bedtime as needed for sleep.    FAMILY HISTORY:   His family history includes Hypertension in his brother, maternal uncle, mother, and sister.  SOCIAL HISTORY:  He  reports that he has never smoked. He has never used smokeless tobacco. He reports that he does not drink alcohol or use drugs.  REVIEW OF SYSTEMS:    NA  App cct 60 min    Brett Canales  ACNP [facility] PCCM Pager [phone] till 1 pm If no answer page [phone] 08/10/2018, 8:42 AM

## 2018-08-10 NOTE — Progress Notes (Addendum)
Messaged Dr. Nelson Chimes DDimer Results for Mark Robles, Mark Robles (MRN [id number]) as of 08/10/2018 00:49  Ref. Range 08/09/2018 21:05  D-Dimer, Quant Latest Ref Range: 0.00 - 0.50 ug/mL-FEU >20.00 (H)    Results for Mark Robles, Mark Robles (MRN [id number]) as of 08/10/2018 00:49  Ref. Range 08/09/2018 00:00  Sample type Unknown ARTERIAL DRAW  Delivery systems Unknown NON-REBREATHER OXYGEN MASK  FIO2 Unknown 100.00  pH, Arterial Latest Ref Range: 7.350 - 7.450  7.484 (H)  pCO2 arterial Latest Ref Range: 32.0 - 48.0 mmHg 28.9 (L)  pO2, Arterial Latest Ref Range: 83.0 - 108.0 mmHg 68.7 (L)  Acid-base deficit Latest Ref Range: 0.0 - 2.0 mmol/L 1.5  Bicarbonate Latest Ref Range: 20.0 - 28.0 mmol/L 21.5  O2 Saturation Latest Units: % 93.4  Patient temperature Unknown 98.6  Collection site Unknown RIGHT RADIAL  Allens test (pass/fail) Latest Ref Range: PASS  PASS

## 2018-08-10 NOTE — Progress Notes (Addendum)
Messaged Dr. Rana Snare per vitals change, arousable by pain stimulation, increased drowsiness and lethargy noted when interpreter was asking questions.  Temp 94.5 oral, 94.6 axillary, 97.7 rectal, BP now 89/64 MAP 73, HR 53, SPO2 97% on 15L NRB, RR 23, denies pain.  Awaiting MD response.   Bear hugger will be ordered from supplies in anticipation of MD order.  Bear hugger applied at 07:05, communicated to day RN that 2x attempt to reach MD were made, awiating response per above information.

## 2018-08-10 NOTE — Procedures (Signed)
Central Venous Catheter Insertion Procedure Note Mark Robles [id number] 02/04/1951  Procedure: Insertion of Central Venous Catheter Indications: Assessment of intravascular volume and Drug and/or fluid administration  Procedure Details Consent: Unable to obtain consent because of emergent medical necessity. Time Out: Verified patient identification, verified procedure, site/side was marked, verified correct patient position, special equipment/implants available, medications/allergies/relevent history reviewed, required imaging and test results available.  Performed  Maximum sterile technique was used including antiseptics, cap, gloves, gown, hand hygiene, mask and sheet. Skin prep: Chlorhexidine; local anesthetic administered A antimicrobial bonded/coated triple lumen catheter was placed in the left internal jugular vein using the Seldinger technique. Ultrasound guidance used.Yes.   Catheter placed to 20 cm. Blood aspirated via all 3 ports and then flushed x 3. Line sutured x 2 and dressing applied.  Evaluation Blood flow good Complications: No apparent complications Patient did tolerate procedure well. Chest X-ray ordered to verify placement.  CXR: pending.  Brett Canales Miyo Aina ACNP [facility] PCCM Pager [phone] till 1 pm If no answer page [phone] 08/10/2018, 10:16 AM

## 2018-08-10 NOTE — Procedures (Signed)
Arterial Catheter Insertion Procedure Note Mark Robles [id number] 14-Jul-1950  Procedure: Insertion of Arterial Catheter  Indications: Blood pressure monitoring and Frequent blood sampling  Procedure Details Consent: Unable to obtain consent because of emergent medical necessity. Time Out: Verified patient identification, verified procedure, site/side was marked, verified correct patient position, special equipment/implants available, medications/allergies/relevent history reviewed, required imaging and test results available.  Performed  Maximum sterile technique was used including antiseptics, cap, gloves, gown, hand hygiene, mask and sheet. Skin prep: Chlorhexidine; local anesthetic administered 20 gauge catheter was inserted into right femoral artery using the Seldinger technique.  Evaluation Blood flow good; BP tracing good. Complications: No apparent complications.   Mark Robles ACNP [facility] PCCM Pager [phone] till 3 pm If no answer page [phone] 08/10/2018, 10:15 AM

## 2018-08-10 NOTE — Progress Notes (Addendum)
Pts daughter Doy Mince was called and updated on pt status. Pts wife was also on the phone while Doy Mince translated for this RN. Password updated-[employer] All questions/concerns were answered. Caswell Corwin, RN 08/10/18 9:33 PM

## 2018-08-11 LAB — COMPREHENSIVE METABOLIC PANEL
ALT: 123 U/L — ABNORMAL HIGH (ref 0–44)
AST: 86 U/L — ABNORMAL HIGH (ref 15–41)
Albumin: 2.1 g/dL — ABNORMAL LOW (ref 3.5–5.0)
Alkaline Phosphatase: 83 U/L (ref 38–126)
Anion gap: 12 (ref 5–15)
BUN: 19 mg/dL (ref 8–23)
CO2: 20 mmol/L — ABNORMAL LOW (ref 22–32)
Calcium: 8.3 mg/dL — ABNORMAL LOW (ref 8.9–10.3)
Chloride: 103 mmol/L (ref 98–111)
Creatinine, Ser: 0.85 mg/dL (ref 0.61–1.24)
GFR calc Af Amer: >60 mL/min (ref >60)
GFR calc non Af Amer: >60 mL/min (ref >60)
Glucose, Bld: 181 mg/dL — ABNORMAL HIGH (ref 70–99)
Potassium: 5.1 mmol/L (ref 3.5–5.1)
Sodium: 135 mmol/L (ref 135–145)
Total Bilirubin: 0.5 mg/dL (ref 0.3–1.2)
Total Protein: 6.3 g/dL — ABNORMAL LOW (ref 6.5–8.1)

## 2018-08-11 LAB — POCT I-STAT 7, (LYTES, BLD GAS, ICA,H+H)
Acid-base deficit: 4 mmol/L — ABNORMAL HIGH (ref 0.0–2.0)
Acid-base deficit: 2 mmol/L (ref 0.0–2.0)
Bicarbonate: 21.5 mmol/L (ref 20.0–28.0)
Bicarbonate: 23.6 mmol/L (ref 20.0–28.0)
Calcium, Ion: 1.26 mmol/L (ref 1.15–1.40)
Calcium, Ion: 1.27 mmol/L (ref 1.15–1.40)
HCT: 36 % — ABNORMAL LOW (ref 39.0–52.0)
HCT: 37 % — ABNORMAL LOW (ref 39.0–52.0)
Hemoglobin: 12.2 g/dL — ABNORMAL LOW (ref 13.0–17.0)
Hemoglobin: 12.6 g/dL — ABNORMAL LOW (ref 13.0–17.0)
O2 Saturation: 94 %
O2 Saturation: 94 %
Patient temperature: 98.1
Potassium: 4.8 mmol/L (ref 3.5–5.1)
Potassium: 4.6 mmol/L (ref 3.5–5.1)
Sodium: 137 mmol/L (ref 135–145)
Sodium: 138 mmol/L (ref 135–145)
TCO2: 23 mmol/L (ref 22–32)
TCO2: 25 mmol/L (ref 22–32)
pCO2 arterial: 40.4 mmHg (ref 32.0–48.0)
pCO2 arterial: 44.5 mmHg (ref 32.0–48.0)
pH, Arterial: 7.333 — ABNORMAL LOW (ref 7.350–7.450)
pH, Arterial: 7.332 — ABNORMAL LOW (ref 7.350–7.450)
pO2, Arterial: 74 mmHg — ABNORMAL LOW (ref 83.0–108.0)
pO2, Arterial: 76 mmHg — ABNORMAL LOW (ref 83.0–108.0)

## 2018-08-11 LAB — PHOSPHORUS: Phosphorus: 4.3 mg/dL (ref 2.5–4.6)

## 2018-08-11 LAB — GLUCOSE, CAPILLARY
Glucose-Capillary: 142 mg/dL — ABNORMAL HIGH (ref 70–99)
Glucose-Capillary: 144 mg/dL — ABNORMAL HIGH (ref 70–99)
Glucose-Capillary: 147 mg/dL — ABNORMAL HIGH (ref 70–99)
Glucose-Capillary: 175 mg/dL — ABNORMAL HIGH (ref 70–99)
Glucose-Capillary: 180 mg/dL — ABNORMAL HIGH (ref 70–99)
Glucose-Capillary: 184 mg/dL — ABNORMAL HIGH (ref 70–99)

## 2018-08-11 LAB — CBC WITH DIFFERENTIAL/PLATELET
Abs Immature Granulocytes: 0 10*3/uL (ref 0.00–0.07)
Basophils Absolute: 0 10*3/uL (ref 0.0–0.1)
Basophils Relative: 0 %
Eosinophils Absolute: 0 10*3/uL (ref 0.0–0.5)
Eosinophils Relative: 0 %
HCT: 39.6 % (ref 39.0–52.0)
Hemoglobin: 13.3 g/dL (ref 13.0–17.0)
Lymphocytes Relative: 3 %
Lymphs Abs: 0.2 10*3/uL — ABNORMAL LOW (ref 0.7–4.0)
MCH: 31 pg (ref 26.0–34.0)
MCHC: 33.6 g/dL (ref 30.0–36.0)
MCV: 92.3 fL (ref 80.0–100.0)
Monocytes Absolute: 0 10*3/uL — ABNORMAL LOW (ref 0.1–1.0)
Monocytes Relative: 0 %
Neutro Abs: 6.1 10*3/uL (ref 1.7–7.7)
Neutrophils Relative %: 97 %
Platelets: 246 10*3/uL (ref 150–400)
RBC: 4.29 MIL/uL (ref 4.22–5.81)
RDW: 14.1 % (ref 11.5–15.5)
WBC: 6.3 10*3/uL (ref 4.0–10.5)
nRBC: 0 % (ref 0.0–0.2)
nRBC: 0 /100 WBC

## 2018-08-11 LAB — HEPATITIS B SURFACE ANTIGEN: Hepatitis B Surface Ag: NEGATIVE

## 2018-08-11 LAB — CK: Total CK: 47 U/L — ABNORMAL LOW (ref 49–397)

## 2018-08-11 LAB — GLUCOSE 6 PHOSPHATE DEHYDROGENASE
G6PDH: 7.5 U/g{Hb} (ref 4.6–13.5)
Hemoglobin: 13.6 g/dL (ref 13.0–17.7)

## 2018-08-11 LAB — INTERLEUKIN-6, PLASMA
Interleukin-6, Plasma: 851.3 pg/mL — ABNORMAL HIGH (ref 0.0–12.2)
Interleukin-6, Plasma: 838.5 pg/mL — ABNORMAL HIGH (ref 0.0–12.2)

## 2018-08-11 LAB — PROCALCITONIN: Procalcitonin: 0.34 ng/mL

## 2018-08-11 LAB — TRIGLYCERIDES: Triglycerides: 139 mg/dL (ref <150)

## 2018-08-11 LAB — LEGIONELLA PNEUMOPHILA SEROGP 1 UR AG: L. pneumophila Serogp 1 Ur Ag: NEGATIVE

## 2018-08-11 LAB — FERRITIN: Ferritin: 633 ng/mL — ABNORMAL HIGH (ref 24–336)

## 2018-08-11 LAB — C-REACTIVE PROTEIN: CRP: 8.3 mg/dL — ABNORMAL HIGH (ref <1.0)

## 2018-08-11 LAB — MAGNESIUM: Magnesium: 2.7 mg/dL — ABNORMAL HIGH (ref 1.7–2.4)

## 2018-08-11 MED ORDER — VITAL AF 1.2 CAL PO LIQD
1000.0000 mL | ORAL | Status: DC
  Administered 2018-08-11 – 2018-08-12 (×2): 1000 mL

## 2018-08-11 MED ORDER — METHYLPREDNISOLONE SODIUM SUCC 125 MG IJ SOLR
40.0000 mg | Freq: Three times a day (TID) | INTRAMUSCULAR | Status: AC
  Administered 2018-08-11 – 2018-08-12 (×6): 40 mg via INTRAVENOUS
  Filled 2018-08-11 (×6): qty 2

## 2018-08-11 MED ORDER — FUROSEMIDE 10 MG/ML IJ SOLN
40.0000 mg | Freq: Once | INTRAMUSCULAR | Status: AC
  Administered 2018-08-11: 40 mg via INTRAVENOUS
  Filled 2018-08-11: qty 4

## 2018-08-11 MED ORDER — PRO-STAT SUGAR FREE PO LIQD
30.0000 mL | Freq: Two times a day (BID) | ORAL | Status: DC
  Administered 2018-08-11 – 2018-08-12 (×3): 30 mL via ORAL
  Filled 2018-08-11 (×2): qty 30

## 2018-08-11 NOTE — Progress Notes (Signed)
Patient being transferred to [facility] by [facility].  Vent placed on standby.  Pt connected to transport vent.  No complications.

## 2018-08-11 NOTE — Progress Notes (Addendum)
PULMONARY / CRITICAL CARE MEDICINE   NAME:  Mark Robles, MRN:  [id number], DOB:  06/07/1950, LOS: 2 ADMISSION DATE:  08/09/2018, CONSULTATION DATE: 08/10/2018 REFERRING MD: [facility], CHIEF COMPLAINT: Respiratory failure in the setting COVID-19 positive  BRIEF HISTORY:    68 year old Spanish-speaking male fever chills sweats COVID-19 positive. Intubated for resp failure, worsening resp status  SIGNIFICANT PAST MEDICAL HISTORY   CVA, Hypertension, Hyperlipidemia  SIGNIFICANT EVENTS:  4/22-  S/p Tcoulizimab x 1  4/23- Transfer to intensive care due to increasing lethargy and inability to protect airway. Started solumedrol x 3 days.  STUDIES:   08/09/2018 COVID-19 positive  CULTURES:  Bcx 4/22 > SARS COV2 4/22 > Positive Sputum 4/23 >  Urine strep pneumo 4/23- negative  ANTIBIOTICS:  Ceftriaxone 4/23 > Azithromycin 4/23 >  LINES/TUBES:  08/10/2018 orotracheal tube>> 08/10/2018 gastric tube>> 08/10/2018 central line>> 08/10/2018 arterial line>>  CONSULTANTS:   SUBJECTIVE:  Continues on vent, No major issues overnight On levophed > weaning down  CONSTITUTIONAL: BP 93/67   Pulse 63   Temp 98.1 F (36.7 C) (Oral)   Resp 14   Ht 5\' 9"  (1.753 m)   Wt 77.9 kg   SpO2 95%   BMI 25.36 kg/m   I/O last 3 completed shifts: In: 881.5 [P.O.:150; I.V.:531.5; IV Piggyback:200] Out: 1050 [Urine:1050]  CVP:  [2 mmHg-9 mmHg] 2 mmHg  Vent Mode: PRVC FiO2 (%):  [50 %-100 %] 50 % Set Rate:  [16 bmp] 16 bmp Vt Set:  [420 mL] 420 mL PEEP:  [5 cmH20-12 cmH20] 12 cmH20 Plateau Pressure:  [21 cmH20-25 cmH20] 21 cmH20  PHYSICAL EXAM: Gen:      No acute distress HEENT:  EOMI, sclera anicteric, ETT Neck:     No masses; no thyromegaly Lungs:    Clear to auscultation bilaterally; normal respiratory effort CV:         Regular rate and rhythm; no murmurs Abd:      + bowel sounds; soft, non-tender; no palpable masses, no distension Ext:    No edema; adequate peripheral  perfusion Skin:      Warm and dry; no rash Neuro: Sedated, unresponsive  RESOLVED PROBLEM LIST   ASSESSMENT AND PLAN   68 year old with ARDS secondary to COVID-19  Respiratory failure, ARDS P/F ratio is adequate.  Does not need proning Follow ABG Wean down FiO2 as tolerated Maintain PEEP of 12 for now Solumedrol for 3 days Lasix 40 mg x 1 On empiric antibiotics. Can stop is repeat Pct is low  History of CVA Follow mental status as sedation is being weaned  Shock Weaning Levophed Monitor CVP.  Best Practice / Goals of Care / Disposition.   DVT PROPHYLAXIS: Lovenox. Weight based dosing SUP: Pepcid NUTRITION: Tube feeds MOBILITY: Bedrest GOALS OF CARE: Currently full code FAMILY DISCUSSIONS: Family updated 4/23 DISPOSITION ICU. [facility]    LABS  Glucose Recent Labs  Lab 08/10/18 0759 08/10/18 1231 08/10/18 1626 08/10/18 2041 08/11/18 0052 08/11/18 0459  GLUCAP 99 104* 132* 142* 175* 144*    BMET Recent Labs  Lab 08/09/18 1604 08/09/18 2023 08/10/18 0125 08/10/18 1141 08/11/18 0247  NA 131*  --  131* 134* 135  K 4.5  --  4.7 3.9 5.1  CL 100  --  100  --  103  CO2 21*  --  22  --  20*  BUN 11  --  12  --  19  CREATININE 0.85 0.90 0.75  --  0.85  GLUCOSE 163*  --  122*  --  181*    Liver Enzymes Recent Labs  Lab 08/09/18 1604 08/10/18 0125 08/11/18 0247  AST 180* 152* 86*  ALT 191* 166* 123*  ALKPHOS 99 91 83  BILITOT 0.7 0.9 0.5  ALBUMIN 2.3* 2.2* 2.1*    Electrolytes Recent Labs  Lab 08/09/18 1604 08/10/18 0125 08/11/18 0247  CALCIUM 8.3* 8.2* 8.3*  MG  --  2.4 2.7*  PHOS  --  3.3 4.3    CBC Recent Labs  Lab 08/09/18 2023 08/10/18 0125 08/10/18 1141 08/11/18 0247  WBC 6.3 6.5  --  6.3  HGB 13.7 13.6 11.9* 13.3  HCT 39.6 39.1 35.0* 39.6  PLT 168 181  --  246    ABG Recent Labs  Lab 08/09/18 0000 08/10/18 1141 08/10/18 1617  PHART 7.484* 7.358 7.321*  PCO2ART 28.9* 37.8 39.6  PO2ART  68.7* 71.0* 109*    Coag's No results for input(s): APTT, INR in the last 168 hours.  Sepsis Markers Recent Labs  Lab 08/09/18 1621 08/09/18 1849 08/09/18 2023 08/10/18 1600  LATICACIDVEN 2.4* 1.9  --  1.4  PROCALCITON  --   --  0.45  --     Cardiac Enzymes Recent Labs  Lab 08/09/18 2023  TROPONINI <0.03   The patient is critically ill with multiple organ system failure and requires high complexity decision making for assessment and support, frequent evaluation and titration of therapies, advanced monitoring, review of radiographic studies and interpretation of complex data.   Critical Care Time devoted to patient care services, exclusive of separately billable procedures, described in this note is 35 minutes.   Chilton Greathouse MD [facility] Pager [phone] If no answer call [phone] 08/11/2018, 8:08 AM

## 2018-08-11 NOTE — Progress Notes (Signed)
Utilized interpreter to call daughter, Mark Robles, back, however no answer. Left voice message to contact back for an update on patient condition.

## 2018-08-11 NOTE — Progress Notes (Signed)
[name] TEAM 1 - Stepdown/ICU TEAM  Mark Robles  HOZ:[id number] DOB: 02-07-1951 DOA: 08/09/2018 PCP: Lanae Boast, FNP    Brief Narrative:  68yo Spanish-speaking male w/ a hx of HTN, CVA, and HLD who presented w/ 7 days of fever/chills, sweats, and atypical chest pain and was found to have COVID-19. He was intubated for resp failure. He had been seen in an UC on 4/15, where he was sent home w/ Levaquin and a diagnosis of PNA.   Significant Events: 4/22 S/p Tcoulizimab x 1  4/23 tx to ICU w/ increasing lethargy and inability to protect airway - intubated - CVL placed  4/24 tx to [facility]  COVID-19 specific Treatment: tocilizumub 4/22  Ventilator Settings: PRVC / Rate 16 / Vt 420 / PEEP 12  / Plateau 20  / FiO2 50  Subjective: Pt transferred to [name] from [facility] ICU today. He was seen by the PCCM team today prior to his transfer.   Assessment & Plan:  Acute Hypoxic Respiratory failure - COVID 19 - ARDS  History of CVA  Shock Weaning Levophed  DVT prophylaxis: lovenox  Code Status: FULL CODE Family Communication:   Disposition Plan:   Consultants:  PCCM  Antimicrobials:  Ceftriaxone 4/23 > Azithromycin 4/23 >  Objective: Blood pressure 100/71, pulse (!) 59, temperature (!) 97.5 F (36.4 C), temperature source Axillary, resp. rate 15, height _0  (1.753 m), weight 77.9 kg, SpO2 98 %.  Intake/Output Summary (Last 24 hours) at 08/11/2018 1406 Last data filed at 08/11/2018 1357 Gross per 24 hour  Intake 825.85 ml  Output 2295 ml  Net -1469.15 ml   Filed Weights   08/09/18 2136  Weight: 77.9 kg    Examination: No exam by [facility] today   CBC: Recent Labs  Lab 08/09/18 2023 08/10/18 0125 08/10/18 1141 08/11/18 0247 08/11/18 0821  WBC 6.3 6.5  --  6.3  --   NEUTROABS 5.0 5.4  --  6.1  --   HGB 13.7  13.6 13.6 11.9* 13.3 12.2*  HCT 39.6 39.1 35.0* 39.6 36.0*  MCV 90.6 88.9  --  92.3  --   PLT 168 181  --  246  --    Basic Metabolic Panel:  Recent Labs  Lab 08/09/18 1604 08/09/18 2023 08/10/18 0125 08/10/18 1141 08/11/18 0247 08/11/18 0821  NA 131*  --  131* 134* 135 137  K 4.5  --  4.7 3.9 5.1 4.8  CL 100  --  100  --  103  --   CO2 21*  --  22  --  20*  --   GLUCOSE 163*  --  122*  --  181*  --   BUN 11  --  12  --  19  --   CREATININE 0.85 0.90 0.75  --  0.85  --   CALCIUM 8.3*  --  8.2*  --  8.3*  --   MG  --   --  2.4  --  2.7*  --   PHOS  --   --  3.3  --  4.3  --    GFR: Estimated Creatinine Clearance: 86.6 mL/min (by C-G formula based on SCr of 0.85 mg/dL).  Liver Function Tests: Recent Labs  Lab 08/09/18 1604 08/10/18 0125 08/11/18 0247  AST 180* 152* 86*  ALT 191* 166* 123*  ALKPHOS 99 91 83  BILITOT 0.7 0.9 0.5  PROT 6.4* 6.3* 6.3*  ALBUMIN 2.3* 2.2* 2.1*    Cardiac Enzymes: Recent Labs  Lab 08/09/18 2023 08/10/18 0125 08/11/18 0247  CKTOTAL  --  48* 47*  TROPONINI <0.03  --   --     HbA1C: Hemoglobin A1C  Date/Time Value Ref Range Status  05/16/2017 10:52 AM 5.4  Final    CBG: Recent Labs  Lab 08/10/18 2041 08/11/18 0052 08/11/18 0459 08/11/18 0847 08/11/18 1221  GLUCAP 142* 175* 144* 147* 142*    Recent Results (from the past 240 hour(s))  Culture, blood (routine x 2)     Status: None (Preliminary result)   Collection Time: 08/09/18  4:00 PM  Result Value Ref Range Status   Specimen Description BLOOD SITE NOT SPECIFIED  Final   Special Requests   Final    BOTTLES DRAWN AEROBIC AND ANAEROBIC Blood Culture adequate volume   Culture   Final    NO GROWTH 2 DAYS Performed at [facility], [place] [address], [place], [place] [address]    Report Status PENDING  Incomplete  SARS Coronavirus 2 [facility] order, Performed in [place] hospital lab)     Status: Abnormal   Collection Time: 08/09/18  4:11 PM  Result Value Ref Range Status   SARS Coronavirus 2 POSITIVE (A) NEGATIVE Final    Comment: RESULT CALLED TO, READ BACK BY AND VERIFIED WITH: [place] 1245  08/09/18 A BROWNING (NOTE) If result is NEGATIVE SARS-CoV-2 target nucleic acids are NOT DETECTED. The SARS-CoV-2 RNA is generally detectable in upper and lower  respiratory specimens during the acute phase of infection. The lowest  concentration of SARS-CoV-2 viral copies this assay can detect is 250  copies / mL. A negative result does not preclude SARS-CoV-2 infection  and should not be used as the sole basis for treatment or other  patient management decisions.  A negative result may occur with  improper specimen collection / handling, submission of specimen other  than nasopharyngeal swab, presence of viral mutation(s) within the  areas targeted by this assay, and inadequate number of viral copies  (<250 copies / mL). A negative result must be combined with clinical  observations, patient history, and epidemiological information. If result is POSITIVE SARS-CoV-2 target nucleic acids are DETECTED.  The SARS-CoV-2 RNA is generally detectable in upper and lower  respiratory specimens during the acute phase of infection.  Positive  results are indicative of active infection with SARS-CoV-2.  Clinical  correlation with patient history and other diagnostic information is  necessary to determine patient infection status.  Positive results do  not rule out bacterial infection or co-infection with other viruses. If result is PRESUMPTIVE POSTIVE SARS-CoV-2 nucleic acids MAY BE PRESENT.   A presumptive positive result was obtained on the submitted specimen  and confirmed on repeat testing.  While 2019 novel coronavirus  (SARS-CoV-2) nucleic acids may be present in the submitted sample  additional confirmatory testing may be necessary for epidemiological  and / or clinical management purposes  to differentiate between  SARS-CoV-2 and other Sarbecovirus currently known to infect humans.  If clinically indicated additional testing with an alternate test  methodology [phone])  is advised. The  SARS-CoV-2 RNA is generally  detectable in upper and lower respiratory specimens during the acute  phase of infection. The expected result is Negative. Fact Sheet for Patients:  StrictlyIdeas.no Fact Sheet for Healthcare Providers: BankingDealers.co.za This test is not yet approved or cleared by the [place] FDA and has been authorized for detection and/or diagnosis of SARS-CoV-2 by FDA under an Emergency Use Authorization (EUA).  This EUA will remain in effect (meaning this test can be used) for the duration of the COVID-19 declaration under Section 564(b)(1) of the Act, 21 U.S.C. section 360bbb-3(b)(1), unless the authorization is terminated or revoked sooner. Performed at [facility], [place] [address], [place], [place] [address]   Culture, blood (routine x 2)     Status: None (Preliminary result)   Collection Time: 08/09/18  8:30 PM  Result Value Ref Range Status   Specimen Description BLOOD RIGHT ARM  Final   Special Requests   Final    BOTTLES DRAWN AEROBIC ONLY Blood Culture results may not be optimal due to an excessive volume of blood received in culture bottles   Culture   Final    NO GROWTH 2 DAYS Performed at [facility], [place] [address] [place], [place] [address]    Report Status PENDING  Incomplete  MRSA PCR Screening     Status: None   Collection Time: 08/10/18 12:03 PM  Result Value Ref Range Status   MRSA by PCR NEGATIVE NEGATIVE Final    Comment:        The GeneXpert MRSA Assay (FDA approved for NASAL specimens only), is one component of a comprehensive MRSA colonization surveillance program. It is not intended to diagnose MRSA infection nor to guide or monitor treatment for MRSA infections. Performed at [facility], [place] [address], [place], [place] [address]   Culture, respiratory (non-expectorated)     Status: None (Preliminary result)   Collection Time: 08/10/18  2:45 PM  Result  Value Ref Range Status   Specimen Description TRACHEAL ASPIRATE  Final   Special Requests NONE  Final   Gram Stain   Final    NO SQUAMOUS EPITHELIAL CELLS PRESENT FEW WBC PRESENT,BOTH PMN AND MONONUCLEAR FEW GRAM POSITIVE COCCI IN PAIRS IN CLUSTERS RARE GRAM NEGATIVE RODS FEW GRAM NEGATIVE COCCOBACILLI    Culture   Final    TOO YOUNG TO READ Performed at [facility], [place] [address], [place], [place] [address]    Report Status PENDING  Incomplete     Scheduled Meds: . chlorhexidine gluconate (MEDLINE KIT)  15 mL Mouth Rinse BID  . enoxaparin (LOVENOX) injection  0.5 mg/kg Subcutaneous Q12H  . feeding supplement (PRO-STAT SUGAR FREE 64)  30 mL Oral BID  . insulin aspart  0-9 Units Subcutaneous Q4H  . mouth rinse  15 mL Mouth Rinse 10 times per day  . methylPREDNISolone (SOLU-MEDROL) injection  40 mg Intravenous TID   Continuous Infusions: . sodium chloride 1,000 mL (08/10/18 1145)  . azithromycin 500 mg (08/11/18 0756)  . cefTRIAXone (ROCEPHIN)  IV Stopped (08/11/18 7741)  . famotidine (PEPCID) IV 100 mL/hr at 08/11/18 1300  . feeding supplement (VITAL AF 1.2 CAL) 1,000 mL (08/11/18 1357)  . fentaNYL infusion INTRAVENOUS 50 mcg/hr (08/11/18 1300)  . ketamine infusion 500 mg in sodium chloride 0.9% 100 mL 0.5 mg/kg/hr (08/11/18 1300)  . midazolam 1 mg/hr (08/11/18 1300)  . norepinephrine (LEVOPHED) Adult infusion 2 mcg/min (08/11/18 1300)     LOS: 2 days   Cherene Altes, MD [facility] Office  [phone] Pager - Text Page per Amion  If 7PM-7AM, please contact night-coverage per Amion 08/11/2018, 2:06 PM

## 2018-08-11 NOTE — Progress Notes (Signed)
Reached out to patient's daughter, Doy Mince, to update on patient transfer to [facility] ICU. Updated daughter on status of patient, however, daughter requesting an interpreter. Advised daughter that I would reach back out with an interpreter on the line.

## 2018-08-11 NOTE — Progress Notes (Signed)
Received pt from [facility]. Pt VS within normal limits, no complications. Pt placed on vent without incident.

## 2018-08-11 NOTE — Progress Notes (Signed)
Pt very asynchronous with ventilator. Pt switched to PCV PC 15/ R 16/ peep 12/50%. CCM MD made aware. ABG will be obtained at 1600. MD and RT to monitor.

## 2018-08-11 NOTE — Progress Notes (Addendum)
Initial Nutrition Assessment RD working remotely.  DOCUMENTATION CODES:   Not applicable  INTERVENTION:   If unable to extubate within the next 24 hours, recommend start TF via OGT:  Vital AF 1.2 at 50 ml/h (1200 ml per day)  Pro-stat 60 ml once daily  Provides 1640 kcal, 120 gm protein, 973 ml free water daily  NUTRITION DIAGNOSIS:   Inadequate oral intake related to inability to eat as evidenced by NPO status.  GOAL:   Patient will meet greater than or equal to 90% of their needs  MONITOR:   Vent status, TF tolerance, Labs  REASON FOR ASSESSMENT:   Ventilator    ASSESSMENT:   68 yo male with PMH of HLD, stroke, seizure who was admitted 4/22 with respiratory failure, COVID-19 positive. Required transfer to the ICU and intubation on 4/23.  Patient is currently intubated on ventilator support MV: 7.2 L/min Temp (24hrs), Avg:97.6 F (36.4 C), Min:97.3 F (36.3 C), Max:98.1 F (36.7 C) MAP range 68-88 since midnight  Labs reviewed. CBG's: [phone] Medications reviewed and include Novolog, Solumedrol, Ketamine, Levophed, Versed.    NUTRITION - FOCUSED PHYSICAL EXAM:  unable to complete  Diet Order:   Diet Order            Diet NPO time specified  Diet effective now              EDUCATION NEEDS:   No education needs have been identified at this time  Skin:  Skin Assessment: Reviewed RN Assessment  Last BM:  4/21  Height:   Ht Readings from Last 1 Encounters:  08/10/18 5\' 9"  (1.753 m)    Weight:   Wt Readings from Last 1 Encounters:  08/09/18 77.9 kg    Ideal Body Weight:  72.7 kg  BMI:  Body mass index is 25.36 kg/m.  Estimated Nutritional Needs:   Kcal:  1690  Protein:  115-135 gm  Fluid:  >/= 1.7 L    Joaquin Courts, RD, LDN, CNSC Pager [phone] After Hours Pager [phone]

## 2018-08-11 NOTE — Progress Notes (Signed)
RN updated daughter Doy Mince on pts status overnight. Daughter also mentioned that pt has had a stroke in the past with some left sided deficits, particularly the LUE, but he is able to move his LLE and walk without a walker.  Caswell Corwin, RN 08/11/18 6:28 AM

## 2018-08-12 DIAGNOSIS — E785 Hyperlipidemia, unspecified: Secondary | ICD-10-CM

## 2018-08-12 DIAGNOSIS — J069 Acute upper respiratory infection, unspecified: Secondary | ICD-10-CM

## 2018-08-12 LAB — COMPREHENSIVE METABOLIC PANEL
ALT: 116 U/L — ABNORMAL HIGH (ref 0–44)
AST: 89 U/L — ABNORMAL HIGH (ref 15–41)
Albumin: 2.4 g/dL — ABNORMAL LOW (ref 3.5–5.0)
Alkaline Phosphatase: 93 U/L (ref 38–126)
Anion gap: 6 (ref 5–15)
BUN: 25 mg/dL — ABNORMAL HIGH (ref 8–23)
CO2: 23 mmol/L (ref 22–32)
Calcium: 8 mg/dL — ABNORMAL LOW (ref 8.9–10.3)
Chloride: 109 mmol/L (ref 98–111)
Creatinine, Ser: 0.73 mg/dL (ref 0.61–1.24)
GFR calc Af Amer: >60 mL/min (ref >60)
GFR calc non Af Amer: >60 mL/min (ref >60)
Glucose, Bld: 236 mg/dL — ABNORMAL HIGH (ref 70–99)
Potassium: 5 mmol/L (ref 3.5–5.1)
Sodium: 138 mmol/L (ref 135–145)
Total Bilirubin: 0.3 mg/dL (ref 0.3–1.2)
Total Protein: 6 g/dL — ABNORMAL LOW (ref 6.5–8.1)

## 2018-08-12 LAB — PHOSPHORUS: Phosphorus: 2.5 mg/dL (ref 2.5–4.6)

## 2018-08-12 LAB — CBC
HCT: 38.7 % — ABNORMAL LOW (ref 39.0–52.0)
Hemoglobin: 12.7 g/dL — ABNORMAL LOW (ref 13.0–17.0)
MCH: 31.6 pg (ref 26.0–34.0)
MCHC: 32.8 g/dL (ref 30.0–36.0)
MCV: 96.3 fL (ref 80.0–100.0)
Platelets: 300 10*3/uL (ref 150–400)
RBC: 4.02 MIL/uL — ABNORMAL LOW (ref 4.22–5.81)
RDW: 14.7 % (ref 11.5–15.5)
WBC: 12.2 10*3/uL — ABNORMAL HIGH (ref 4.0–10.5)
nRBC: 0 % (ref 0.0–0.2)

## 2018-08-12 LAB — GLUCOSE, CAPILLARY
Glucose-Capillary: 228 mg/dL — ABNORMAL HIGH (ref 70–99)
Glucose-Capillary: 186 mg/dL — ABNORMAL HIGH (ref 70–99)
Glucose-Capillary: 149 mg/dL — ABNORMAL HIGH (ref 70–99)
Glucose-Capillary: 123 mg/dL — ABNORMAL HIGH (ref 70–99)
Glucose-Capillary: 170 mg/dL — ABNORMAL HIGH (ref 70–99)
Glucose-Capillary: 149 mg/dL — ABNORMAL HIGH (ref 70–99)

## 2018-08-12 LAB — POCT I-STAT 7, (LYTES, BLD GAS, ICA,H+H)
Acid-base deficit: 1 mmol/L (ref 0.0–2.0)
Bicarbonate: 24.7 mmol/L (ref 20.0–28.0)
Calcium, Ion: 1.26 mmol/L (ref 1.15–1.40)
HCT: 36 % — ABNORMAL LOW (ref 39.0–52.0)
Hemoglobin: 12.2 g/dL — ABNORMAL LOW (ref 13.0–17.0)
O2 Saturation: 96 %
Patient temperature: 98.3
Potassium: 4.8 mmol/L (ref 3.5–5.1)
Sodium: 139 mmol/L (ref 135–145)
TCO2: 26 mmol/L (ref 22–32)
pCO2 arterial: 45.8 mmHg (ref 32.0–48.0)
pH, Arterial: 7.339 — ABNORMAL LOW (ref 7.350–7.450)
pO2, Arterial: 90 mmHg (ref 83.0–108.0)

## 2018-08-12 LAB — C-REACTIVE PROTEIN: CRP: 8.2 mg/dL — ABNORMAL HIGH (ref <1.0)

## 2018-08-12 LAB — PROCALCITONIN: Procalcitonin: 0.27 ng/mL

## 2018-08-12 LAB — CULTURE, RESPIRATORY W GRAM STAIN

## 2018-08-12 LAB — CULTURE, RESPIRATORY: Culture: NORMAL

## 2018-08-12 LAB — TRIGLYCERIDES: Triglycerides: 117 mg/dL (ref <150)

## 2018-08-12 LAB — FERRITIN: Ferritin: 492 ng/mL — ABNORMAL HIGH (ref 24–336)

## 2018-08-12 LAB — MAGNESIUM: Magnesium: 2.6 mg/dL — ABNORMAL HIGH (ref 1.7–2.4)

## 2018-08-12 LAB — CK: Total CK: 27 U/L — ABNORMAL LOW (ref 49–397)

## 2018-08-12 MED ORDER — CHLORHEXIDINE GLUCONATE CLOTH 2 % EX PADS
6.0000 | MEDICATED_PAD | Freq: Every day | CUTANEOUS | Status: DC
  Administered 2018-08-12 – 2018-08-16 (×5): 6 via TOPICAL

## 2018-08-12 MED ORDER — PRO-STAT SUGAR FREE PO LIQD
30.0000 mL | Freq: Two times a day (BID) | ORAL | Status: DC
  Administered 2018-08-12: 30 mL
  Filled 2018-08-12: qty 30

## 2018-08-12 MED ORDER — FUROSEMIDE 10 MG/ML IJ SOLN
40.0000 mg | Freq: Once | INTRAMUSCULAR | Status: AC
  Administered 2018-08-12: 40 mg via INTRAVENOUS
  Filled 2018-08-12: qty 4

## 2018-08-12 MED ORDER — POLYETHYLENE GLYCOL 3350 17 G PO PACK: 17.0000 g | PACK | Freq: Every day | ORAL | Status: DC | PRN

## 2018-08-12 MED ORDER — ALUM & MAG HYDROXIDE-SIMETH 200-200-20 MG/5ML PO SUSP
30.0000 mL | ORAL | Status: DC | PRN
  Filled 2018-08-12: qty 30

## 2018-08-12 MED ORDER — GUAIFENESIN-DM 100-10 MG/5ML PO SYRP
5.0000 mL | ORAL_SOLUTION | ORAL | Status: DC | PRN
  Filled 2018-08-12: qty 5

## 2018-08-12 NOTE — Progress Notes (Signed)
NAME:  Mark Robles, MRN:  [id number], DOB:  09-21-1950, LOS: 3 ADMISSION DATE:  08/09/2018, CONSULTATION DATE:  08/10/2018 REFERRING MD:  Sharon Seller, CHIEF COMPLAINT:  Dyspnea   Brief History   68 y/o male admitted with acute respiratory failure with hypoxemia due to COVID 19.  Intubated on 4/23.  Past Medical History  CVA, Hypertension, Hyperlipidemia  Significant Hospital Events   4/22-  S/p Tcoulizimab x 1  4/23- Transfer to intensive care due to increasing lethargy and inability to protect airway. Started solumedrol x 3 days  Consults:  none  Procedures:  08/10/2018 orotracheal tube>> 08/10/2018 gastric tube>> 08/10/2018 central line>> 08/10/2018 arterial line>>  Significant Diagnostic Tests:    Micro Data:  Bcx 4/22 > SARS COV2 4/22 > Positive Sputum 4/23 >  Urine strep pneumo 4/23- negative  Antimicrobials:  Ceftriaxone 4/23 > Azithromycin 4/23 >  Interim history/subjective:  Oxygen needs improving Still has periods of dyssynchrony with ventilator  Objective   Blood pressure 111/60, pulse (!) 56, temperature 98.2 F (36.8 C), temperature source Axillary, resp. rate 16, height  (1.753 m), weight 79.2 kg, SpO2 97 %.    Vent Mode: PCV FiO2 (%):  [40 %-50 %] 40 % Set Rate:  [16 bmp] 16 bmp Vt Set:  [420 mL] 420 mL PEEP:  [10 cmH20-12 cmH20] 10 cmH20 Plateau Pressure:  [20 cmH20-25 cmH20] 21 cmH20   Intake/Output Summary (Last 24 hours) at 08/12/2018 0741 Last data filed at 08/12/2018 0700 Gross per 24 hour  Intake 1705.59 ml  Output 2050 ml  Net -344.41 ml   Filed Weights   08/09/18 2136 08/12/18 0330  Weight: 77.9 kg 79.2 kg    Examination:  General:  In bed on vent HENT: NCAT ETT in place PULM: CTA B, vent supported breathing CV: RRR, no mgr GI: BS+, soft, nontender MSK: normal bulk and tone Neuro: sedated on vent    Resolved Hospital Problem list     Assessment & Plan:  ARDS due to COVID 19 Continue full mechanical  ventilatory support targeting tidal volumes of 6 to 8cc/kg ideal body weight, using pressure control this morning for better ventilator synchrony, will need to monitor tidal volumes closely Ventilator associated pneumonia prevention Daily wake-up assessment at this point is reasonable Continue to wean PEEP and FiO2 per ARDS protocol Furosemide x1  Need for sedation for synchrony with mechanical ventilation: At this point we are able to lighten sedation Discontinue ketamine Discontinue versed drip over the course of the day Continue fentanyl drip for today, titrated to RA SS score of -1 to -2   Best practice:  Diet: tube feeding Pain/Anxiety/Delirium protocol (if indicated): RASS score -1 to -2 VAP protocol (if indicated): yes DVT prophylaxis: lovenox GI prophylaxis: famotidine Glucose control: per TRH Mobility: bed rest Code Status: full Family Communication: will contact today Disposition: remain in ICU  Labs   CBC: Recent Labs  Lab 08/09/18 1604 08/09/18 2023 08/10/18 0125  08/11/18 0247 08/11/18 0821 08/11/18 1558 08/12/18 0150 08/12/18 0357  WBC 8.8 6.3 6.5  --  6.3  --   --  12.2*  --   NEUTROABS 7.2 5.0 5.4  --  6.1  --   --   --   --   HGB 7.9* 13.7  13.6 13.6   < > 13.3 12.2* 12.6* 12.7* 12.2*  HCT 23.4* 39.6 39.Edna Grover8.7* 36.0*  MCV 95.1 90.6 88.9  --  92.3  --   --  96.3  --   PLT 224 168 181  --  246  --   --  300  --    < > = values in this interval not displayed.    Basic Metabolic Panel: Recent Labs  Lab 08/09/18 1604 08/09/18 2023 08/10/18 0125  08/11/18 0247 08/11/18 0821 08/11/18 1558 08/12/18 0150 08/12/18 0357  NA 131*  --  131*   < > 135 137 138 138 139  K 4.5  --  4.7   < > 5.1 4.8 4.6 5.0 4.8  CL 100  --  100  --  103  --   --  109  --   CO2 21*  --  22  --  20*  --   --  23  --   GLUCOSE 163*  --  122*  --  181*  --   --  236*  --   BUN 11  --  12  --  19  --   --  25*  --   CREATININE 0.85 0.90 0.75  --  0.85   --   --  0.73  --   CALCIUM 8.3*  --  8.2*  --  8.3*  --   --  8.0*  --   MG  --   --  2.4  --  2.7*  --   --  2.6*  --   PHOS  --   --  3.3  --  4.3  --   --  2.5  --    < > = values in this interval not displayed.   GFR: Estimated Creatinine Clearance: 92.1 mL/min (by C-G formula based on SCr of 0.73 mg/dL). Recent Labs  Lab 08/09/18 1621 08/09/18 1849 08/09/18 2023 08/10/18 0125 08/10/18 1600 08/11/18 0247 08/11/18 1029 08/12/18 0150  PROCALCITON  --   --  0.45  --   --   --  0.34 0.27  WBC  --   --  6.3 6.5  --  6.3  --  12.2*  LATICACIDVEN 2.4* 1.9  --   --  1.4  --   --   --     Liver Function Tests: Recent Labs  Lab 08/09/18 1604 08/10/18 0125 08/11/18 0247 08/12/18 0150  AST 180* 152* 86* 89*  ALT 191* 166* 123* 116*  ALKPHOS 99 91 83 93  BILITOT 0.7 0.9 0.5 0.3  PROT 6.4* 6.3* 6.3* 6.0*  ALBUMIN 2.3* 2.2* 2.1* 2.4*   No results for input(s): LIPASE, AMYLASE in the last 168 hours. No results for input(s): AMMONIA in the last 168 hours.  ABG    Component Value Date/Time   PHART 7.339 (L) 08/12/2018 0357   PCO2ART 45.8 08/12/2018 0357   PO2ART 90.0 08/12/2018 0357   HCO3 24.7 08/12/2018 0357   TCO2 26 08/12/2018 0357   ACIDBASEDEF 1.0 08/12/2018 0357   O2SAT 96.0 08/12/2018 0357     Coagulation Profile: No results for input(s): INR, PROTIME in the last 168 hours.  Cardiac Enzymes: Recent Labs  Lab 08/09/18 2023 08/10/18 0125 08/11/18 0247 08/12/18 0150  CKTOTAL  --  48* 47* 27*  TROPONINI <0.03  --   --   --     HbA1C: Hemoglobin A1C  Date/Time Value Ref Range Status  05/16/2017 10:52 AM 5.4  Final    CBG: Recent Labs  Lab 08/11/18 0847 08/11/18 1221 08/11/18 1609 08/11/18 2003 08/12/18 0310  GLUCAP 147* 142* 180* 184* 228*     Critical care  time: 31 minutes    Heber CarolinaBrent McQuaid, MD [facility] Pager: [phone] Cell: [phone] If no response, call [phone]

## 2018-08-12 NOTE — Progress Notes (Signed)
Wasted 68ml of Ketamine down the sink with Tyron Russell, Char[employer] fundraiser.

## 2018-08-12 NOTE — Progress Notes (Signed)
Spoke with daughter, Doy Mince and gave update on patient condition. Agreed to attempt a virtual visit with elink today between 4-4:30pm.

## 2018-08-12 NOTE — Progress Notes (Signed)
[facility] TEAM 1 - Stepdown/ICU TEAM  Ulrich Soules  FGH:[id number] DOB: [birth year] DOA: 08/09/2018 PCP: Lanae Boast, FNP    Brief Narrative:  68yo Spanish-speaking male w/ a hx of HTN, CVA, and HLD who presented w/ 7 days of fever/chills, sweats, and atypical chest pain and was found to have COVID-19. He was intubated for resp failure. He had been seen in an UC on 4/15, and was sent home w/ Levaquin and a diagnosis of PNA.   Significant Events: 4/22 S/p Tocilizimab x 1  4/23 tx to ICU w/ increasing lethargy and inability to protect airway - intubated - CVL placed  4/24 tx to [facility]  COVID-19 specific Treatment: tocilizumub 4/22  Recent Labs  Lab 08/09/18 2023 08/10/18 0125 08/11/18 0247 08/12/18 0150  CRP 22.2* 19.9* 8.3* 8.2*   Recent Labs  Lab 08/09/18 2023 08/10/18 0125 08/11/18 0247 08/12/18 0150  FERRITIN 679* 542* 633* 492*    Ventilator Settings: PC / Rate 16 / Vt / PEEP 10  / Plateau 21  / FiO2 40  Subjective: Pt experienced some vent dysynchrony 4/24 afternoon, and required change to PCV ventilation.   Assessment & Plan:  Acute Hypoxic Respiratory failure - COVID 19 - ARDS Stopping abx today as procalcitonin not c/w bacterial PNA - cont vent support for COVID-19 - attempting to wean sedatives today   History of CVA Baseline fxn not clear - PT/OT as able   Shock Weaning Levophed  DVT prophylaxis: lovenox  Code Status: FULL CODE Family Communication:   Disposition Plan: ICU on vent   Consultants:  PCCM  Antimicrobials:  Ceftriaxone 4/23 > 4/24 Azithromycin 4/24  Objective: Blood pressure 111/60, pulse (!) 55, temperature 97.6 F (36.4 C), temperature source Axillary, resp. rate 16, height '5\' 9"'$  (1.753 m), weight 79.2 kg, SpO2 98 %.  Intake/Output Summary (Last 24 hours) at 08/12/2018 0748 Last data filed at 08/12/2018 0700 Gross per 24 hour  Intake 1705.59 ml  Output 2050 ml  Net -344.41 ml   Filed Weights   08/09/18  2136 08/12/18 0330  Weight: 77.9 kg 79.2 kg    Examination: General: vent dependent  Lungs: fine crackles diffusely - no wheezing  Cardiovascular: RRR - no M or rub  Abdomen: Nondistended, soft, bowel sounds positive, no appreciable mass Extremities: trace edema bilateral lower extremities   CBC: Recent Labs  Lab 08/09/18 2023 08/10/18 0125  08/11/18 0247  08/11/18 1558 08/12/18 0150 08/12/18 0357  WBC 6.3 6.5  --  6.3  --   --  12.2*  --   NEUTROABS 5.0 5.4  --  6.1  --   --   --   --   HGB 13.7  13.6 13.6   < > 13.3   < > 12.6* 12.7* 12.2*  HCT 39.6 39.1   < > 39.6   < > 37.0* 38.7* 36.0*  MCV 90.6 88.9  --  92.3  --   --  96.3  --   PLT 168 181  --  246  --   --  300  --    < > = values in this interval not displayed.   Basic Metabolic Panel: Recent Labs  Lab 08/10/18 0125  08/11/18 0247  08/11/18 1558 08/12/18 0150 08/12/18 0357  NA 131*   < > 135   < > 138 138 139  K 4.7   < > 5.1   < > 4.6 5.0 4.8  CL 100  --  103  --   --  109  --   CO2 22  --  20*  --   --  23  --   GLUCOSE 122*  --  181*  --   --  236*  --   BUN 12  --  19  --   --  25*  --   CREATININE 0.75  --  0.85  --   --  0.73  --   CALCIUM 8.2*  --  8.3*  --   --  8.0*  --   MG 2.4  --  2.7*  --   --  2.6*  --   PHOS 3.3  --  4.3  --   --  2.5  --    < > = values in this interval not displayed.   GFR: Estimated Creatinine Clearance: 92.1 mL/min (by C-G formula based on SCr of 0.73 mg/dL).  Liver Function Tests: Recent Labs  Lab 08/09/18 1604 08/10/18 0125 08/11/18 0247 08/12/18 0150  AST 180* 152* 86* 89*  ALT 191* 166* 123* 116*  ALKPHOS 99 91 83 93  BILITOT 0.7 0.9 0.5 0.3  PROT 6.4* 6.3* 6.3* 6.0*  ALBUMIN 2.3* 2.2* 2.1* 2.4*    Cardiac Enzymes: Recent Labs  Lab 08/09/18 2023 08/10/18 0125 08/11/18 0247 08/12/18 0150  CKTOTAL  --  48* 47* 27*  TROPONINI <0.03  --   --   --     HbA1C: Hemoglobin A1C  Date/Time Value Ref Range Status  05/16/2017 10:52 AM 5.4  Final     CBG: Recent Labs  Lab 08/11/18 0847 08/11/18 1221 08/11/18 1609 08/11/18 2003 08/12/18 0310  GLUCAP 147* 142* 180* 184* 228*    Recent Results (from the past 240 hour(s))  Culture, blood (routine x 2)     Status: None (Preliminary result)   Collection Time: 08/09/18  4:00 PM  Result Value Ref Range Status   Specimen Description BLOOD SITE NOT SPECIFIED  Final   Special Requests   Final    BOTTLES DRAWN AEROBIC AND ANAEROBIC Blood Culture adequate volume   Culture   Final    NO GROWTH 2 DAYS Performed at [facility], [place] [address], [place], [place] [address]    Report Status PENDING  Incomplete  SARS Coronavirus 2 [facility] order, Performed in [facility] hospital lab)     Status: Abnormal   Collection Time: 08/09/18  4:11 PM  Result Value Ref Range Status   SARS Coronavirus 2 POSITIVE (A) NEGATIVE Final    Comment: RESULT CALLED TO, READ BACK BY AND VERIFIED WITH: [place] [id number] 08/09/18 A BROWNING (NOTE) If result is NEGATIVE SARS-CoV-2 target nucleic acids are NOT DETECTED. The SARS-CoV-2 RNA is generally detectable in upper and lower  respiratory specimens during the acute phase of infection. The lowest  concentration of SARS-CoV-2 viral copies this assay can detect is 250  copies / mL. A negative result does not preclude SARS-CoV-2 infection  and should not be used as the sole basis for treatment or other  patient management decisions.  A negative result may occur with  improper specimen collection / handling, submission of specimen other  than nasopharyngeal swab, presence of viral mutation(s) within the  areas targeted by this assay, and inadequate number of viral copies  (<250 copies / mL). A negative result must be combined with clinical  observations, patient history, and epidemiological information. If result is POSITIVE SARS-CoV-2 target nucleic acids are DETECTED.  The SARS-CoV-2 RNA is generally detectable in upper  and lower  respiratory  specimens during the acute phase of infection.  Positive  results are indicative of active infection with SARS-CoV-2.  Clinical  correlation with patient history and other diagnostic information is  necessary to determine patient infection status.  Positive results do  not rule out bacterial infection or co-infection with other viruses. If result is PRESUMPTIVE POSTIVE SARS-CoV-2 nucleic acids MAY BE PRESENT.   A presumptive positive result was obtained on the submitted specimen  and confirmed on repeat testing.  While 2019 novel coronavirus  (SARS-CoV-2) nucleic acids may be present in the submitted sample  additional confirmatory testing may be necessary for epidemiological  and / or clinical management purposes  to differentiate between  SARS-CoV-2 and other Sarbecovirus currently known to infect humans.  If clinically indicated additional testing with an alternate test  methodology [phone])  is advised. The SARS-CoV-2 RNA is generally  detectable in upper and lower respiratory specimens during the acute  phase of infection. The expected result is Negative. Fact Sheet for Patients:  StrictlyIdeas.no Fact Sheet for Healthcare Providers: BankingDealers.co.za This test is not yet approved or cleared by the [place] FDA and has been authorized for detection and/or diagnosis of SARS-CoV-2 by FDA under an Emergency Use Authorization (EUA).  This EUA will remain in effect (meaning this test can be used) for the duration of the COVID-19 declaration under Section 564(b)(1) of the Act, 21 U.S.C. section 360bbb-3(b)(1), unless the authorization is terminated or revoked sooner. Performed at [facility], [place] [address], [place], [place] [address]   Culture, blood (routine x 2)     Status: None (Preliminary result)   Collection Time: 08/09/18  8:30 PM  Result Value Ref Range Status   Specimen Description BLOOD RIGHT ARM  Final    Special Requests   Final    BOTTLES DRAWN AEROBIC ONLY Blood Culture results may not be optimal due to an excessive volume of blood received in culture bottles   Culture   Final    NO GROWTH 2 DAYS Performed at [facility], [place] [address], [place], [place] [address]    Report Status PENDING  Incomplete  MRSA PCR Screening     Status: None   Collection Time: 08/10/18 12:03 PM  Result Value Ref Range Status   MRSA by PCR NEGATIVE NEGATIVE Final    Comment:        The GeneXpert MRSA Assay (FDA approved for NASAL specimens only), is one component of a comprehensive MRSA colonization surveillance program. It is not intended to diagnose MRSA infection nor to guide or monitor treatment for MRSA infections. Performed at [facility], [place] [address], [place], [place] [address]   Culture, respiratory (non-expectorated)     Status: None (Preliminary result)   Collection Time: 08/10/18  2:45 PM  Result Value Ref Range Status   Specimen Description TRACHEAL ASPIRATE  Final   Special Requests NONE  Final   Gram Stain   Final    NO SQUAMOUS EPITHELIAL CELLS PRESENT FEW WBC PRESENT,BOTH PMN AND MONONUCLEAR FEW GRAM POSITIVE COCCI IN PAIRS IN CLUSTERS RARE GRAM NEGATIVE RODS FEW GRAM NEGATIVE COCCOBACILLI    Culture   Final    TOO YOUNG TO READ Performed at [facility], [place] [address], [place],  [address]    Report Status PENDING  Incomplete     Scheduled Meds: . chlorhexidine gluconate (MEDLINE KIT)  15 mL Mouth Rinse BID  . Chlorhexidine Gluconate Cloth  6 each Topical Q0600  . enoxaparin (LOVENOX) injection  0.5 mg/kg Subcutaneous Q12H  . feeding supplement (PRO-STAT SUGAR FREE 64)  30 mL Oral BID  . insulin aspart  0-9 Units Subcutaneous Q4H  . mouth rinse  15 mL Mouth Rinse 10 times per day  . methylPREDNISolone (SOLU-MEDROL) injection  40 mg Intravenous TID   Continuous Infusions: . sodium chloride 10 mL/hr at 08/12/18 0703  . azithromycin 500 mg  (08/11/18 0756)  . cefTRIAXone (ROCEPHIN)  IV Stopped (08/11/18 1884)  . famotidine (PEPCID) IV Stopped (08/11/18 2132)  . feeding supplement (VITAL AF 1.2 CAL) 1,000 mL (08/11/18 1357)  . fentaNYL infusion INTRAVENOUS 100 mcg/hr (08/12/18 0700)  . ketamine infusion 500 mg in sodium chloride 0.9% 100 mL 0.5 mg/kg/hr (08/12/18 0700)  . midazolam 3 mg/hr (08/12/18 0700)  . norepinephrine (LEVOPHED) Adult infusion 1 mcg/min (08/12/18 0700)     LOS: 3 days   Cherene Altes, MD [facility] Office  [phone] Pager - Text Page per Amion  If 7PM-7AM, please contact night-coverage per Amion 08/12/2018, 7:48 AM

## 2018-08-12 NOTE — Progress Notes (Signed)
Assisted daughter, Doy Mince, and family with virtual e-link visit. Answered all questions and gave update on patient status.

## 2018-08-13 ENCOUNTER — Encounter (HOSPITAL_COMMUNITY): Payer: Self-pay

## 2018-08-13 LAB — GLUCOSE, CAPILLARY
Glucose-Capillary: 151 mg/dL — ABNORMAL HIGH (ref 70–99)
Glucose-Capillary: 100 mg/dL — ABNORMAL HIGH (ref 70–99)
Glucose-Capillary: 124 mg/dL — ABNORMAL HIGH (ref 70–99)
Glucose-Capillary: 80 mg/dL (ref 70–99)
Glucose-Capillary: 143 mg/dL — ABNORMAL HIGH (ref 70–99)
Glucose-Capillary: 101 mg/dL — ABNORMAL HIGH (ref 70–99)

## 2018-08-13 LAB — BASIC METABOLIC PANEL
Anion gap: 6 (ref 5–15)
BUN: 26 mg/dL — ABNORMAL HIGH (ref 8–23)
CO2: 28 mmol/L (ref 22–32)
Calcium: 8.3 mg/dL — ABNORMAL LOW (ref 8.9–10.3)
Chloride: 105 mmol/L (ref 98–111)
Creatinine, Ser: 0.6 mg/dL — ABNORMAL LOW (ref 0.61–1.24)
GFR calc Af Amer: >60 mL/min (ref >60)
GFR calc non Af Amer: >60 mL/min (ref >60)
Glucose, Bld: 150 mg/dL — ABNORMAL HIGH (ref 70–99)
Potassium: 4.7 mmol/L (ref 3.5–5.1)
Sodium: 139 mmol/L (ref 135–145)

## 2018-08-13 LAB — CBC
HCT: 37.5 % — ABNORMAL LOW (ref 39.0–52.0)
Hemoglobin: 12.5 g/dL — ABNORMAL LOW (ref 13.0–17.0)
MCH: 31.8 pg (ref 26.0–34.0)
MCHC: 33.3 g/dL (ref 30.0–36.0)
MCV: 95.4 fL (ref 80.0–100.0)
Platelets: 249 10*3/uL (ref 150–400)
RBC: 3.93 MIL/uL — ABNORMAL LOW (ref 4.22–5.81)
RDW: 14.6 % (ref 11.5–15.5)
WBC: 10.3 10*3/uL (ref 4.0–10.5)
nRBC: 0 % (ref 0.0–0.2)

## 2018-08-13 MED ORDER — MECLIZINE HCL 25 MG PO TABS
25.0000 mg | ORAL_TABLET | Freq: Three times a day (TID) | ORAL | Status: DC | PRN
  Administered 2018-08-13 – 2018-08-15 (×2): 25 mg via ORAL
  Filled 2018-08-13 (×4): qty 1

## 2018-08-13 MED ORDER — ACETAMINOPHEN 160 MG/5ML PO SOLN: 650.0000 mg | Freq: Four times a day (QID) | ORAL | Status: DC | PRN

## 2018-08-13 MED ORDER — RESOURCE THICKENUP CLEAR PO POWD
ORAL | Status: DC | PRN
  Filled 2018-08-13: qty 125

## 2018-08-13 NOTE — Progress Notes (Signed)
[facility] TEAM 1 - Stepdown/ICU TEAM  Chase Arnall  IWL:[id number] DOB: Nov 06, 1950 DOA: 08/09/2018 PCP: Lanae Boast, FNP    Brief Narrative:  68yo Spanish-speaking male w/ a hx of HTN, CVA (2011 > L sided weakness & poor short term memory), and HLD who presented w/ 7 days of fever/chills, sweats, and atypical chest pain and was found to have COVID-19. He was intubated for resp failure. He had been seen in an UC on 4/15, and was sent home w/ Levaquin and a diagnosis of PNA. His wife works at [employer] ([employer] in [place], and had sx concerning for [place] prior to his illness.   Significant Events: 4/22 S/p Tocilizimab x 1  4/23 tx to ICU w/ increasing lethargy and inability to protect airway - intubated - CVL placed  4/24 tx to [facility] 4/26 extubated  GTTs Fentanyl Versed   COVID-19 specific Treatment: tocilizumub 4/22  Ventilator Settings: PC / Rate 16 / Vt / PEEP 5  / Plateau 13  / FiO2 40  Subjective: Weaned off ketamine and tolerated this well.  After rounds were completed by [facility] and PCCM it was felt the patient was appropriate for extubation.  He was successfully extubated and is currently stable off the vent.  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) PT/OT as able - chronically on plavix and pravastatin - reportedly requires assistance w/ ambulation at baseline due to imbalance and weakness -transfer to progressive care tomorrow if remains stable to facilitate more active therapy  Hx of DVT tx w/ Eliquis for ?2 years - f/u doppler July 2019 negative for DVT -was not on anticoagulation at time of admission  HLD  Resume pravastatin when able   Shock Resolved   Chronic sinus brady Has been previously evaluated [facility] w/ holter monitor   DVT prophylaxis: lovenox  Code Status: FULL CODE Family Communication: Tele  conference with family was arranged shortly after extubation Disposition Plan: ICU immediately post extubation for close observation  Consultants:  PCCM  Antimicrobials:  Ceftriaxone 4/23 > 4/24 Azithromycin 4/24  Objective: Blood pressure 125/76, pulse (!) 59, temperature 98.2 F (36.8 C), temperature source Axillary, resp. rate 15, height '5\' 9"'$  (1.753 m), weight 79.2 kg, SpO2 100 %.  Intake/Output Summary (Last 24 hours) at 08/13/2018 0750 Last data filed at 08/13/2018 0600 Gross per 24 hour  Intake 1576.91 ml  Output 2595 ml  Net -1018.09 ml   Filed Weights   08/09/18 2136 08/12/18 0330  Weight: 77.9 kg 79.2 kg    Examination: General: Acutely extubated -appears stable with no respiratory distress Lungs: fine crackles diffusely with good air movement in bilateral fields Cardiovascular: RRR - no M or rub  Abdomen: Nondistended, soft, bowel sounds positive, no mass Extremities: trace edema bilateral lower extremities without change   CBC: Recent Labs  Lab 08/09/18 2023 08/10/18 0125  08/11/18 0247  08/12/18 0150 08/12/18 0357 08/13/18 0215  WBC 6.3 6.5  --  6.3  --  12.2*  --  10.3  NEUTROABS 5.0 5.4  --  6.1  --   --   --   --   HGB 13.7  13.6 13.6   < > 13.3   < > 12.7* 12.2* 12.5*  HCT 39.6 39.1   < > 39.6   < > 38.7* 36.0* 37.5*  MCV 90.6 88.9  --  92.3  --  96.3  --  95.4  PLT 168 181  --  246  --  300  --  249   < > = values in this interval not displayed.   Basic Metabolic Panel: Recent Labs  Lab 08/10/18 0125  08/11/18 0247  08/12/18 0150 08/12/18 0357 08/13/18 0215  NA 131*   < > 135   < > 138 139 139  K 4.7   < > 5.1   < > 5.0 4.8 4.7  CL 100  --  103  --  109  --  105  CO2 22  --  20*  --  23  --  28  GLUCOSE 122*  --  181*  --  236*  --  150*  BUN 12  --  19  --  25*  --  26*  CREATININE 0.75  --  0.85  --  0.73  --  0.60*  CALCIUM 8.2*  --  8.3*  --  8.0*  --  8.3*  MG 2.4  --  2.7*  --  2.6*  --   --   PHOS 3.3  --  4.3  --  2.5  --    --    < > = values in this interval not displayed.   GFR: Estimated Creatinine Clearance: 92.1 mL/min (A) (by C-G formula based on SCr of 0.6 mg/dL (L)).  Liver Function Tests: Recent Labs  Lab 08/09/18 1604 08/10/18 0125 08/11/18 0247 08/12/18 0150  AST 180* 152* 86* 89*  ALT 191* 166* 123* 116*  ALKPHOS 99 91 83 93  BILITOT 0.7 0.9 0.5 0.3  PROT 6.4* 6.3* 6.3* 6.0*  ALBUMIN 2.3* 2.2* 2.1* 2.4*    Cardiac Enzymes: Recent Labs  Lab 08/09/18 2023 08/10/18 0125 08/11/18 0247 08/12/18 0150  CKTOTAL  --  48* 47* 27*  TROPONINI <0.03  --   --   --     HbA1C: Hemoglobin A1C  Date/Time Value Ref Range Status  05/16/2017 10:52 AM 5.4  Final    CBG: Recent Labs  Lab 08/12/18 1144 08/12/18 1548 08/12/18 1934 08/12/18 2318 08/13/18 0313  GLUCAP 149* 123* 170* 149* 151*    Recent Results (from the past 240 hour(s))  Culture, blood (routine x 2)     Status: None (Preliminary result)   Collection Time: 08/09/18  4:00 PM  Result Value Ref Range Status   Specimen Description BLOOD SITE NOT SPECIFIED  Final   Special Requests   Final    BOTTLES DRAWN AEROBIC AND ANAEROBIC Blood Culture adequate volume   Culture   Final    NO GROWTH 4 DAYS Performed at [facility], [place] [address] [place], [place] [address]    Report Status PENDING  Incomplete  SARS Coronavirus 2 [facility] order, Performed in [facility] hospital lab)     Status: Abnormal   Collection Time: 08/09/18  4:11 PM  Result Value Ref Range Status   SARS Coronavirus 2 POSITIVE (A) NEGATIVE Final    Comment: RESULT CALLED TO, READ BACK BY AND VERIFIED WITH: [place] [id number] 08/09/18 A BROWNING (NOTE) If result is NEGATIVE SARS-CoV-2 target nucleic acids are NOT DETECTED. The SARS-CoV-2 RNA is generally detectable in upper and lower  respiratory specimens during the acute phase of infection. The lowest  concentration of SARS-CoV-2 viral copies this assay can detect is 250  copies / mL. A negative  result does not preclude SARS-CoV-2 infection  and should not be used as the sole  basis for treatment or other  patient management decisions.  A negative result may occur with  improper specimen collection / handling, submission of specimen other  than nasopharyngeal swab, presence of viral mutation(s) within the  areas targeted by this assay, and inadequate number of viral copies  (<250 copies / mL). A negative result must be combined with clinical  observations, patient history, and epidemiological information. If result is POSITIVE SARS-CoV-2 target nucleic acids are DETECTED.  The SARS-CoV-2 RNA is generally detectable in upper and lower  respiratory specimens during the acute phase of infection.  Positive  results are indicative of active infection with SARS-CoV-2.  Clinical  correlation with patient history and other diagnostic information is  necessary to determine patient infection status.  Positive results do  not rule out bacterial infection or co-infection with other viruses. If result is PRESUMPTIVE POSTIVE SARS-CoV-2 nucleic acids MAY BE PRESENT.   A presumptive positive result was obtained on the submitted specimen  and confirmed on repeat testing.  While 2019 novel coronavirus  (SARS-CoV-2) nucleic acids may be present in the submitted sample  additional confirmatory testing may be necessary for epidemiological  and / or clinical management purposes  to differentiate between  SARS-CoV-2 and other Sarbecovirus currently known to infect humans.  If clinically indicated additional testing with an alternate test  methodology [phone])  is advised. The SARS-CoV-2 RNA is generally  detectable in upper and lower respiratory specimens during the acute  phase of infection. The expected result is Negative. Fact Sheet for Patients:  StrictlyIdeas.no Fact Sheet for Healthcare Providers: BankingDealers.co.za This test is not yet  approved or cleared by the [place] FDA and has been authorized for detection and/or diagnosis of SARS-CoV-2 by FDA under an Emergency Use Authorization (EUA).  This EUA will remain in effect (meaning this test can be used) for the duration of the COVID-19 declaration under Section 564(b)(1) of the Act, 21 U.S.C. section 360bbb-3(b)(1), unless the authorization is terminated or revoked sooner. Performed at [facility], [place] [address], [place], [place] [address]   Culture, blood (routine x 2)     Status: None (Preliminary result)   Collection Time: 08/09/18  8:30 PM  Result Value Ref Range Status   Specimen Description BLOOD RIGHT ARM  Final   Special Requests   Final    BOTTLES DRAWN AEROBIC ONLY Blood Culture results may not be optimal due to an excessive volume of blood received in culture bottles   Culture   Final    NO GROWTH 4 DAYS Performed at [facility], [place] [address] [place], [place] [address]    Report Status PENDING  Incomplete  MRSA PCR Screening     Status: None   Collection Time: 08/10/18 12:03 PM  Result Value Ref Range Status   MRSA by PCR NEGATIVE NEGATIVE Final    Comment:        The GeneXpert MRSA Assay (FDA approved for NASAL specimens only), is one component of a comprehensive MRSA colonization surveillance program. It is not intended to diagnose MRSA infection nor to guide or monitor treatment for MRSA infections. Performed at [facility], [place] [address], [place], [place] [address]   Culture, respiratory (non-expectorated)     Status: None   Collection Time: 08/10/18  2:45 PM  Result Value Ref Range Status   Specimen Description TRACHEAL ASPIRATE  Final   Special Requests NONE  Final   Gram Stain   Final  NO SQUAMOUS EPITHELIAL CELLS PRESENT FEW WBC PRESENT,BOTH PMN AND MONONUCLEAR FEW GRAM POSITIVE COCCI IN PAIRS IN CLUSTERS RARE GRAM NEGATIVE RODS FEW GRAM NEGATIVE COCCOBACILLI    Culture   Final    Consistent  with normal respiratory flora. Performed at [facility], [place] [address] [place], [place] [address]    Report Status 08/12/2018 FINAL  Final     Scheduled Meds: . chlorhexidine gluconate (MEDLINE KIT)  15 mL Mouth Rinse BID  . Chlorhexidine Gluconate Cloth  6 each Topical Q0600  . enoxaparin (LOVENOX) injection  0.5 mg/kg Subcutaneous Q12H  . feeding supplement (PRO-STAT SUGAR FREE 64)  30 mL Per Tube BID  . insulin aspart  0-9 Units Subcutaneous Q4H  . mouth rinse  15 mL Mouth Rinse 10 times per day     LOS: 4 days   Cherene Altes, MD [facility] Office  [phone] Pager - Text Page per Amion  If 7PM-7AM, please contact night-coverage per Amion 08/13/2018, 7:50 AM

## 2018-08-13 NOTE — Progress Notes (Signed)
Updated daughter and wife regarding patient status.  Answered all questions and they were thankful for having a spanish speaking nurse today.  Will update family again later today if patient gets extubated.

## 2018-08-13 NOTE — Evaluation (Addendum)
Clinical/Bedside Swallow Evaluation Patient Details  Name: Mark CraftGuadalupe Rodriguez Alcantara MRN: [id number] Date of Birth: 07/24/1950  Today's Date: 08/13/2018 Time: SLP Start Time (ACUTE ONLY): 1530 SLP Stop Time (ACUTE ONLY): 1600 SLP Time Calculation (min) (ACUTE ONLY): 30 min  Past Medical History:  Past Medical History:  Diagnosis Date  . Depression   . DVT (deep venous thrombosis) (HCC)   . Hyperlipidemia   . Left-sided sensory deficit present   . Seizure (HCC)   . Stroke [facility](HCC)    Past Surgical History:  Past Surgical History:  Procedure Laterality Date  . NO PAST SURGERIES     HPI:  68 y/o male admitted with acute respiratory failure with hypoxemia due to COVID 19.  Intubated on 4/23-4/26. Pt has a history of CVA, wife reports acute dysphagia only immediately after CVA, resolved and pt was able to eat and drink regular textures. He has some trouble with his teeth so he avoid tough meats. He eats very quickly and cannot look at his food while he is eating because he gets dizzy when he looks down. Persistent cognitve impairment includes decreased awareness, short term memory and depression per family.    Assessment / Plan / Recommendation Clinical Impression  Pt presents with clinical indications of a mild, acute reversible dysphagia following 4 day intubation. He is alert and able to follow instructions with extra time due to distraction of dizziness. Trials of ice and small sips of thin liquids result in an immediate explosive cough response. Pt was able to consume puree, honey and nectar textures without cough or wet vocal quality. Pt drank 4 oz of nectar thick water from a cup consecutively at the end of session, without cough response. Of note, therapist did not instruct him to do this and family did mention impulsive PO intake. Pt was able to masticate but was a little slower and also had a cough with dry cracker.  Recommend pt initiate a Dys 2 (fine chop) diet with nectar thick  liquids with good potential to upgrade in the coming days. He will need at least set up assist with meals.   SLP Visit Diagnosis: Dysphagia, oropharyngeal phase (R13.12)    Aspiration Risk  Moderate aspiration risk    Diet Recommendation Dysphagia 2 (Fine chop);Nectar-thick liquid   Liquid Administration via: Cup;Straw Medication Administration: Whole meds with puree Supervision: Staff to assist with self feeding Compensations: Slow rate;Small sips/bites;Minimize environmental distractions Postural Changes: Seated upright at 90 degrees    Other  Recommendations     Follow up Recommendations 24 hour supervision/assistance      Frequency and Duration min 2x/week  2 weeks       Prognosis Prognosis for Safe Diet Advancement: Good      Swallow Study   General HPI: 68 y/o male admitted with acute respiratory failure with hypoxemia due to COVID 19.  Intubated on 4/23-4/26. Pt has a history of CVA, wife reprots acute dysphagia only immediately after CVA, resolved and pt was able to eat and drink regular textures. He has some trouble with his teeth so he avoid tough meats. He eats very quickly and cannot look at his food while he is eating because he gets dizzy when he looks down. Persistent cognitve impairment includes decreased awareness, short term memory and depression per family.  Type of Study: Bedside Swallow Evaluation Diet Prior to this Study: NPO Temperature Spikes Noted: No Respiratory Status: Nasal cannula History of Recent Intubation: Yes Length of Intubations (days): 4 days Date extubated: 08/13/18  Behavior/Cognition: Alert;Cooperative;Pleasant mood;Requires cueing Oral Cavity Assessment: Within Functional Limits Oral Care Completed by SLP: No Oral Cavity - Dentition: Adequate natural dentition Vision: Impaired for self-feeding Self-Feeding Abilities: Needs assist Patient Positioning: Upright in bed Baseline Vocal Quality: Normal Volitional Cough: Strong Volitional  Swallow: Able to elicit    Oral/Motor/Sensory Function Overall Oral Motor/Sensory Function: Mild impairment Facial ROM: Reduced left;Suspected CN VII (facial) dysfunction Facial Symmetry: Within Functional Limits Facial Strength: Reduced left;Suspected CN VII (facial) dysfunction Facial Sensation: Within Functional Limits Lingual ROM: Within Functional Limits Lingual Symmetry: Within Functional Limits Lingual Strength: Within Functional Limits Lingual Sensation: Within Functional Limits Velum: Within Functional Limits Mandible: Within Functional Limits   Ice Chips Ice chips: Impaired Presentation: Spoon Pharyngeal Phase Impairments: Cough - Immediate   Thin Liquid Thin Liquid: Impaired Presentation: Cup Pharyngeal  Phase Impairments: Cough - Immediate    Nectar Thick Nectar Thick Liquid: Within functional limits Presentation: Cup   Honey Thick Honey Thick Liquid: Not tested   Puree Puree: Within functional limits Presentation: Spoon   Solid     Solid: Impaired Oral Phase Impairments: Impaired mastication Oral Phase Functional Implications: Impaired mastication Pharyngeal Phase Impairments: Cough - Immediate     Harlon Ditty, MA CCC-SLP  Acute Rehabilitation Services Pager [phone] Office [phone]  Claudine Mouton 08/13/2018,5:09 PM

## 2018-08-13 NOTE — Procedures (Signed)
Extubation Procedure Note  Patient Details:   Name: Mark Robles DOB: [birth year] MRN: [id number]   Airway Documentation:    Vent end date: 08/13/18 Vent end time: 0940   Evaluation  O2 sats: stable throughout Complications: No apparent complications Patient did tolerate procedure well. Bilateral Breath Sounds: Clear, Diminished   Yes   Positive cuff leak noted. Patient placed on NRB mask 15 L, no stridor noted. Pt not following instructions for incentive spirometer at this time.  Rayburn Felt 08/13/2018, 9:53 AM

## 2018-08-13 NOTE — Progress Notes (Signed)
Assisted tele visit to patient with wife.  Samina Weekes Ann, RN  

## 2018-08-13 NOTE — Progress Notes (Signed)
NAME:  Mark Robles, MRN:  [id number], DOB:  12/23/1950, LOS: 4 ADMISSION DATE:  08/09/2018, CONSULTATION DATE:  08/10/2018 REFERRING MD:  Sharon SellerMcClung, CHIEF COMPLAINT:  Dyspnea   Brief History   68 y/o male admitted with acute respiratory failure with hypoxemia due to COVID 19.  Intubated on 4/23.  Past Medical History  CVA, Hypertension, Hyperlipidemia  Significant Hospital Events   4/22-  S/p Tcoulizimab x 1  4/23- Transfer to intensive care due to increasing lethargy and inability to protect airway. Started solumedrol x 3 days  Consults:  none  Procedures:  08/10/2018 orotracheal tube>> 08/10/2018 gastric tube>> 08/10/2018 central line>> 08/10/2018 arterial line>>  Significant Diagnostic Tests:    Micro Data:  Bcx 4/22 > SARS COV2 4/22 > Positive Sputum 4/23 >  Urine strep pneumo 4/23- negative  Antimicrobials:  Ceftriaxone 4/23 > Azithromycin 4/23 >  Interim history/subjective:  Diuresing Passing SBT this morning Follows commands Minimal oxygen needs  Objective   Blood pressure 134/88, pulse 65, temperature 98.2 F (36.8 C), temperature source Axillary, resp. rate 17, height 5\' 9"  (1.753 m), weight 79.2 kg, SpO2 100 %.    Vent Mode: PCV FiO2 (%):  [40 %] 40 % Set Rate:  [16 bmp] 16 bmp PEEP:  [5 cmH20-8 cmH20] 5 cmH20 Plateau Pressure:  [13 cmH20-21 cmH20] 13 cmH20   Intake/Output Summary (Last 24 hours) at 08/13/2018 0802 Last data filed at 08/13/2018 0700 Gross per 24 hour  Intake 1582.55 ml  Output 2695 ml  Net -1112.45 ml   Filed Weights   08/09/18 2136 08/12/18 0330  Weight: 77.9 kg 79.2 kg    Examination:  General:  In bed on vent HENT: NCAT ETT in place PULM: CTA B, vent supported breathing CV: RRR, no mgr GI: BS+, soft, nontender MSK: normal bulk and tone Neuro: awake, alert, follows commands    Resolved Hospital Problem list     Assessment & Plan:  ARDS due to COVID 19 Improved significantly this morning Extubation  N.p.o. afterwards especially considering history of stroke Speech therapy evaluation prior to taking anything by mouth Continue efforts at diuresis   Need for sedation for synchrony with mechanical ventilation: Stop sedation protocol    Best practice:  Diet: tube feeding Pain/Anxiety/Delirium protocol (if indicated): RASS score -1 to -2 VAP protocol (if indicated): yes DVT prophylaxis: lovenox GI prophylaxis: famotidine Glucose control: per TRH Mobility: bed rest Code Status: full Family Communication: will contact today Disposition: remain in ICU  Labs   CBC: Recent Labs  Lab 08/09/18 1604 08/09/18 2023 08/10/18 0125  08/11/18 0247 08/11/18 0821 08/11/18 1558 08/12/18 0150 08/12/18 0357 08/13/18 0215  WBC 8.8 6.3 6.5  --  6.3  --   --  12.2*  --  10.3  NEUTROABS 7.2 5.0 5.4  --  6.1  --   --   --   --   --   HGB 7.9* 13.7  13.6 13.6   < > 13.3 12.2* 12.6* 12.7* 12.2* 12.5*  HCT 23.4* 39.6 39.1   < > 39.6 36.0* 37.0* 38.7* 36.0* 37.5*  MCV 95.1 90.6 88.9  --  92.3  --   --  96.3  --  95.4  PLT 224 168 181  --  246  --   --  300  --  249   < > = values in this interval not displayed.    Basic Metabolic Panel: Recent Labs  Lab 08/09/18 1604 08/09/18 2023 08/10/18 0125  08/11/18 0247 08/11/18 [id number]  08/11/18 1558 08/12/18 0150 08/12/18 0357 08/13/18 0215  NA 131*  --  131*   < > 135 137 138 138 139 139  K 4.5  --  4.7   < > 5.1 4.8 4.6 5.0 4.8 4.7  CL 100  --  100  --  103  --   --  109  --  105  CO2 21*  --  22  --  20*  --   --  23  --  28  GLUCOSE 163*  --  122*  --  181*  --   --  236*  --  150*  BUN 11  --  12  --  19  --   --  25*  --  26*  CREATININE 0.85 0.90 0.75  --  0.85  --   --  0.73  --  0.60*  CALCIUM 8.3*  --  8.2*  --  8.3*  --   --  8.0*  --  8.3*  MG  --   --  2.4  --  2.7*  --   --  2.6*  --   --   PHOS  --   --  3.3  --  4.3  --   --  2.5  --   --    < > = values in this interval not displayed.   GFR: Estimated Creatinine  Clearance: 92.1 mL/min (A) (by C-G formula based on SCr of 0.6 mg/dL (L)). Recent Labs  Lab 08/09/18 1621 08/09/18 1849 08/09/18 2023 08/10/18 0125 08/10/18 1600 08/11/18 0247 08/11/18 1029 08/12/18 0150 08/13/18 0215  PROCALCITON  --   --  0.45  --   --   --  0.34 0.27  --   WBC  --   --  6.3 6.5  --  6.3  --  12.2* 10.3  LATICACIDVEN 2.4* 1.9  --   --  1.4  --   --   --   --     Liver Function Tests: Recent Labs  Lab 08/09/18 1604 08/10/18 0125 08/11/18 0247 08/12/18 0150  AST 180* 152* 86* 89*  ALT 191* 166* 123* 116*  ALKPHOS 99 91 83 93  BILITOT 0.7 0.9 0.5 0.3  PROT 6.4* 6.3* 6.3* 6.0*  ALBUMIN 2.3* 2.2* 2.1* 2.4*   No results for input(s): LIPASE, AMYLASE in the last 168 hours. No results for input(s): AMMONIA in the last 168 hours.  ABG    Component Value Date/Time   PHART 7.339 (L) 08/12/2018 0357   PCO2ART 45.8 08/12/2018 0357   PO2ART 90.0 08/12/2018 0357   HCO3 24.7 08/12/2018 0357   TCO2 26 08/12/2018 0357   ACIDBASEDEF 1.0 08/12/2018 0357   O2SAT 96.0 08/12/2018 0357     Coagulation Profile: No results for input(s): INR, PROTIME in the last 168 hours.  Cardiac Enzymes: Recent Labs  Lab 08/09/18 2023 08/10/18 0125 08/11/18 0247 08/12/18 0150  CKTOTAL  --  48* 47* 27*  TROPONINI <0.03  --   --   --     HbA1C: Hemoglobin A1C  Date/Time Value Ref Range Status  05/16/2017 10:52 AM 5.4  Final    CBG: Recent Labs  Lab 08/12/18 1548 08/12/18 1934 08/12/18 2318 08/13/18 0313 08/13/18 0750  GLUCAP 123* 170* 149* 151* 100*     Critical care time: 35 minutes    Heber Tichigan, MD [facility] Pager: [phone] Cell: [phone] If no response, call [phone]

## 2018-08-13 NOTE — Progress Notes (Signed)
Updated patients daughter and wife about his extubation.  Family was able to tele visit and patient spoke with them too.

## 2018-08-14 ENCOUNTER — Telehealth: Payer: Self-pay | Admitting: Family Medicine

## 2018-08-14 DIAGNOSIS — J069 Acute upper respiratory infection, unspecified: Secondary | ICD-10-CM

## 2018-08-14 DIAGNOSIS — I639 Cerebral infarction, unspecified: Secondary | ICD-10-CM

## 2018-08-14 LAB — GLUCOSE, CAPILLARY
Glucose-Capillary: 210 mg/dL — ABNORMAL HIGH (ref 70–99)
Glucose-Capillary: 93 mg/dL (ref 70–99)
Glucose-Capillary: 99 mg/dL (ref 70–99)

## 2018-08-14 LAB — CULTURE, BLOOD (ROUTINE X 2)
Culture: NO GROWTH
Culture: NO GROWTH
Special Requests: ADEQUATE

## 2018-08-14 LAB — CBC
HCT: 40.6 % (ref 39.0–52.0)
Hemoglobin: 13.1 g/dL (ref 13.0–17.0)
MCH: 30.3 pg (ref 26.0–34.0)
MCHC: 32.3 g/dL (ref 30.0–36.0)
MCV: 94 fL (ref 80.0–100.0)
Platelets: 280 10*3/uL (ref 150–400)
RBC: 4.32 MIL/uL (ref 4.22–5.81)
RDW: 14.1 % (ref 11.5–15.5)
WBC: 7.9 10*3/uL (ref 4.0–10.5)
nRBC: 0 % (ref 0.0–0.2)

## 2018-08-14 LAB — BASIC METABOLIC PANEL
Anion gap: 9 (ref 5–15)
BUN: 26 mg/dL — ABNORMAL HIGH (ref 8–23)
CO2: 27 mmol/L (ref 22–32)
Calcium: 8.4 mg/dL — ABNORMAL LOW (ref 8.9–10.3)
Chloride: 103 mmol/L (ref 98–111)
Creatinine, Ser: 0.75 mg/dL (ref 0.61–1.24)
GFR calc Af Amer: >60 mL/min (ref >60)
GFR calc non Af Amer: >60 mL/min (ref >60)
Glucose, Bld: 93 mg/dL (ref 70–99)
Potassium: 4.1 mmol/L (ref 3.5–5.1)
Sodium: 139 mmol/L (ref 135–145)

## 2018-08-14 LAB — MAGNESIUM: Magnesium: 2.1 mg/dL (ref 1.7–2.4)

## 2018-08-14 MED ORDER — FLUOXETINE HCL 20 MG PO CAPS
20.0000 mg | ORAL_CAPSULE | Freq: Every day | ORAL | Status: DC
  Administered 2018-08-14 – 2018-08-16 (×3): 20 mg via ORAL
  Filled 2018-08-14 (×5): qty 1

## 2018-08-14 MED ORDER — FAMOTIDINE 20 MG PO TABS
20.0000 mg | ORAL_TABLET | Freq: Two times a day (BID) | ORAL | Status: DC
  Administered 2018-08-14 – 2018-08-16 (×5): 20 mg via ORAL
  Filled 2018-08-14 (×5): qty 1

## 2018-08-14 MED ORDER — ROSUVASTATIN CALCIUM 5 MG PO TABS
20.0000 mg | ORAL_TABLET | Freq: Every day | ORAL | Status: DC
  Administered 2018-08-14: 20 mg via ORAL
  Filled 2018-08-14: qty 1
  Filled 2018-08-14: qty 4

## 2018-08-14 MED ORDER — CLOPIDOGREL BISULFATE 75 MG PO TABS
75.0000 mg | ORAL_TABLET | Freq: Every day | ORAL | Status: DC
  Administered 2018-08-14 – 2018-08-16 (×3): 75 mg via ORAL
  Filled 2018-08-14 (×5): qty 1

## 2018-08-14 MED ORDER — ALUM & MAG HYDROXIDE-SIMETH 200-200-20 MG/5ML PO SUSP
15.0000 mL | ORAL | Status: DC | PRN
  Administered 2018-08-14: 30 mL via ORAL
  Filled 2018-08-14 (×2): qty 30

## 2018-08-14 MED ORDER — GUAIFENESIN-DM 100-10 MG/5ML PO SYRP
5.0000 mL | ORAL_SOLUTION | ORAL | Status: DC | PRN
  Filled 2018-08-14: qty 5

## 2018-08-14 MED ORDER — TRAZODONE HCL 50 MG PO TABS
25.0000 mg | ORAL_TABLET | Freq: Every evening | ORAL | Status: DC | PRN
  Administered 2018-08-14: 50 mg via ORAL
  Filled 2018-08-14: qty 1

## 2018-08-14 MED ORDER — ACETAMINOPHEN 325 MG PO TABS
650.0000 mg | ORAL_TABLET | Freq: Four times a day (QID) | ORAL | Status: DC | PRN
  Administered 2018-08-14: 650 mg via ORAL
  Filled 2018-08-14: qty 2

## 2018-08-14 MED ORDER — HYDROXYZINE HCL 10 MG PO TABS
10.0000 mg | ORAL_TABLET | Freq: Three times a day (TID) | ORAL | Status: DC | PRN
  Filled 2018-08-14: qty 1

## 2018-08-14 MED ORDER — METOCLOPRAMIDE HCL 5 MG/ML IJ SOLN
10.0000 mg | Freq: Three times a day (TID) | INTRAMUSCULAR | Status: DC | PRN
  Administered 2018-08-14: 04:00:00 10 mg via INTRAVENOUS
  Filled 2018-08-14 (×2): qty 2

## 2018-08-14 MED ORDER — POLYETHYLENE GLYCOL 3350 17 G PO PACK: 17.0000 g | PACK | Freq: Every day | ORAL | Status: DC | PRN

## 2018-08-14 NOTE — Progress Notes (Signed)
[place] TEAM 1 - Stepdown/ICU TEAM  Mark Robles  BJY:[id number] DOB: 08/06/1950 DOA: 08/09/2018 PCP: Mike Gipouglas, Andre, FNP    Brief Narrative:  68yo Spanish-speaking male w/ a hx of HTN, CVA (2011 > L sided weakness & poor short term memory), and HLD who presented w/ 7 days of fever/chills, sweats, and atypical chest pain and was found to have COVID-19. He was intubated for resp failure. He had been seen in an UC on 4/15, and was sent home w/ Levaquin and a diagnosis of PNA. His wife works at [employer] ([employer]) in [place], and had sx concerning for COVID prior to his illness.   Significant Events: 4/22 admit to [facility] - S/p Tocilizimab x 1  4/23 tx to ICU w/ increasing lethargy and inability to protect airway - intubated - CVL placed  [facility] extubated  COVID-19 specific Treatment: tocilizumub 4/22  Subjective: Extubated yesterday. Has done well over night off vent. Sats stable on [place] only. Able to speak w/ medical team. Eating breakfast w/ assistance.   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 rate (!) 22, height 5\' 9"  (1.753 m), weight 79.2 kg, SpO2 93 %.  Intake/Output Summary (Last 24 hours) at 08/14/2018 0748 Last data filed at 08/14/2018 0537 Gross per 24 hour  Intake 874.58 ml  Output 1340 ml  Net -465.42 ml   Filed Weights   08/09/18 2136 08/12/18 0330  Weight: 77.9 kg 79.2 kg    Examination: General: stable w/ no acute resp distress  Lungs: improved air movement th/o - no wheezing  Cardiovascular: RRR w/o M  Abdomen: Nondistended, soft, bowel sounds positive, no mass Extremities: chronic mild L LE edema - no R LE edema    CBC: Recent Labs  Lab 08/09/18 2023 08/10/18 0125  08/11/18 0247  08/12/18 0150 08/12/18 0357 08/13/18 0215 08/14/18 0510  WBC 6.3 6.5  --  6.3  --  12.2*  --  10.3 7.9  NEUTROABS 5.0 5.4  --  6.1  --   --   --   --   --   HGB 13.7  13.6 13.6   < > 13.3   < > 12.7* 12.2* 12.5* 13.1  HCT 39.6 39.1   < > 39.6   < > 38.7* 36.0* 37.5* 40.6  MCV 90.6 88.9  --  92.3  --  96.3  --  95.4 94.0  PLT 168 181  --  246  --  300  --  249 280   < > =  values in this interval not displayed.   Basic Metabolic Panel: Recent Labs  Lab 08/10/18 0125  08/11/18 0247  08/12/18 0150 08/12/18 0357 08/13/18 0215 08/14/18 0510  NA 131*   < > 135   < > 138 139 139 139  K 4.7   < > 5.1   < > 5.0 4.8 4.7 4.1  CL 100  --  103  --  109  --  105 103  CO2 22  --  20*  --  23  --  28 27  GLUCOSE 122*  --  181*  --  236*  --  150* 93  BUN 12  --  19  --  25*  --  26* 26*  CREATININE 0.75  --  0.85  --  0.73  --  0.60* 0.75  CALCIUM 8.2*  --  8.3*  --  8.0*  --  8.3* 8.4*  MG 2.4  --  2.7*  --  2.6*  --   --  2.1  PHOS 3.3  --  4.3  --  2.5  --   --   --    < > = values in this interval not displayed.   GFR: Estimated Creatinine Clearance: 92.1 mL/min (by C-G formula based on SCr of 0.75 mg/dL).  Liver Function Tests: Recent Labs  Lab 08/09/18 1604 08/10/18 0125  08/11/18 0247 08/12/18 0150  AST 180* 152* 86* 89*  ALT 191* 166* 123* 116*  ALKPHOS 99 91 83 93  BILITOT 0.7 0.9 0.5 0.3  PROT 6.4* 6.3* 6.3* 6.0*  ALBUMIN 2.3* 2.2* 2.1* 2.4*    Cardiac Enzymes: Recent Labs  Lab 08/09/18 2023 08/10/18 0125 08/11/18 0247 08/12/18 0150  CKTOTAL  --  48* 47* 27*  TROPONINI <0.03  --   --   --     HbA1C: Hemoglobin A1C  Date/Time Value Ref Range Status  05/16/2017 10:52 AM 5.4  Final    CBG: Recent Labs  Lab 08/13/18 1557 08/13/18 2003 08/13/18 2313 08/14/18 0354 08/14/18 0735  GLUCAP 80 143* 101* 93 99    Recent Results (from the past 240 hour(s))  Culture, blood (routine x 2)     Status: None (Preliminary result)   Collection Time: 08/09/18  4:00 PM  Result Value Ref Range Status   Specimen Description BLOOD SITE NOT SPECIFIED  Final   Special Requests   Final    BOTTLES DRAWN AEROBIC AND ANAEROBIC Blood Culture adequate volume   Culture   Final    NO GROWTH 4 DAYS Performed at [facility], [address], [place], [place] [address]    Report Status PENDING  Incomplete  SARS Coronavirus 2 [facility] order, Performed in [facility] hospital lab)     Status: Abnormal   Collection Time: 08/09/18  4:11 PM  Result Value Ref Range Status   SARS Coronavirus 2 POSITIVE (A) NEGATIVE Final    Comment: RESULT CALLED TO, READ BACK BY AND VERIFIED WITH: Particia Nearing RN 1754 08/09/18 A BROWNING (NOTE) If result is NEGATIVE SARS-CoV-2 target nucleic acids are NOT DETECTED. The SARS-CoV-2 RNA is generally detectable in upper and lower  respiratory specimens during the acute phase of infection. The lowest  concentration of SARS-CoV-2 viral copies this assay can detect is 250  copies / mL. A negative result does not preclude SARS-CoV-2 infection  and should not be used as the sole basis for treatment or other  patient management decisions.  A negative result may occur  with  improper specimen collection / handling, submission of  specimen other  than nasopharyngeal swab, presence of viral mutation(s) within the  areas targeted by this assay, and inadequate number of viral copies  (<250 copies / mL). A negative result must be combined with clinical  observations, patient history, and epidemiological information. If result is POSITIVE SARS-CoV-2 target nucleic acids are DETECTED.  The SARS-CoV-2 RNA is generally detectable in upper and lower  respiratory specimens during the acute phase of infection.  Positive  results are indicative of active infection with SARS-CoV-2.  Clinical  correlation with patient history and other diagnostic information is  necessary to determine patient infection status.  Positive results do  not rule out bacterial infection or co-infection with other viruses. If result is PRESUMPTIVE POSTIVE SARS-CoV-2 nucleic acids MAY BE PRESENT.   A presumptive positive result was obtained on the submitted specimen  and confirmed on repeat testing.  While 2019 novel coronavirus  (SARS-CoV-2) nucleic acids may be present in the submitted sample  additional confirmatory testing may be necessary for epidemiological  and / or clinical management purposes  to differentiate between  SARS-CoV-2 and other Sarbecovirus currently known to infect humans.  If clinically indicated additional testing with an alternate test  methodology [phone])  is advised. The SARS-CoV-2 RNA is generally  detectable in upper and lower respiratory specimens during the acute  phase of infection. The expected result is Negative. Fact Sheet for Patients:  BoilerBrush.com.cy Fact Sheet for Healthcare Providers: https://pope.com/ This test is not yet approved or cleared by the [place] FDA and has been authorized for detection and/or diagnosis of SARS-CoV-2 by FDA under an Emergency Use Authorization (EUA).  This EUA will remain in effect (meaning this test can be used) for the  duration of the COVID-19 declaration under Section 564(b)(1) of the Act, 21 U.S.C. section 360bbb-3(b)(1), unless the authorization is terminated or revoked sooner. Performed at [facility], [address], [place], [place] [address]   Culture, blood (routine x 2)     Status: None (Preliminary result)   Collection Time: 08/09/18  8:30 PM  Result Value Ref Range Status   Specimen Description BLOOD RIGHT ARM  Final   Special Requests   Final    BOTTLES DRAWN AEROBIC ONLY Blood Culture results may not be optimal due to an excessive volume of blood received in culture bottles   Culture   Final    NO GROWTH 4 DAYS Performed at [facility], [address] [place], [place] [address]    Report Status PENDING  Incomplete  MRSA PCR Screening     Status: None   Collection Time: 08/10/18 12:03 PM  Result Value Ref Range Status   MRSA by PCR NEGATIVE NEGATIVE Final    Comment:        The GeneXpert MRSA Assay (FDA approved for NASAL specimens only), is one component of a comprehensive MRSA colonization surveillance program. It is not intended to diagnose MRSA infection nor to guide or monitor treatment for MRSA infections. Performed at [facility], [address], [place], [place] [address]   Culture, respiratory (non-expectorated)     Status: None   Collection Time: 08/10/18  2:45 PM  Result Value Ref Range Status   Specimen Description TRACHEAL ASPIRATE  Final   Special Requests NONE  Final   Gram Stain   Final    NO SQUAMOUS EPITHELIAL CELLS PRESENT FEW WBC PRESENT,BOTH PMN AND MONONUCLEAR FEW GRAM POSITIVE  COCCI IN PAIRS IN CLUSTERS RARE GRAM NEGATIVE RODS FEW GRAM NEGATIVE COCCOBACILLI    Culture   Final    Consistent with normal respiratory flora. Performed at [facility], [address], [place], [place] [address]    Report Status 08/12/2018 FINAL  Final     Scheduled Meds: . Chlorhexidine Gluconate Cloth  6 each Topical Q0600  . enoxaparin  (LOVENOX) injection  0.5 mg/kg Subcutaneous Q12H  . feeding supplement (PRO-STAT SUGAR FREE 64)  30 mL Per Tube BID  . insulin aspart  0-9 Units Subcutaneous Q4H     LOS: 5 days   Lonia Blood, MD [facility] Office  [phone] Pager - Text Page per Amion  If 7PM-7AM, please contact night-coverage per Amion 08/14/2018, 7:48 AM

## 2018-08-14 NOTE — Progress Notes (Signed)
PT Cancellation Note  Patient Details Name: Mark Robles MRN: [id number] DOB: 1950-07-20   Cancelled Treatment:    Reason Eval/Treat Not Completed: Other (comment), patient transferring  Out of ICU. Will check back in AM.   Rada Hay 08/14/2018, 4:40 PM Blanchard Kelch PT Acute Rehabilitation Services Pager [phone] Office [phone]

## 2018-08-14 NOTE — Progress Notes (Signed)
Spoke with patient's daughter via phone regarding patient's plan of care and transfer to PCU today.

## 2018-08-14 NOTE — Progress Notes (Signed)
Daughter, Doy Mince notified of father's transfer to PCU Bed 172 via phone.

## 2018-08-14 NOTE — Progress Notes (Signed)
NAME:  Mark Robles, MRN:  [id number], DOB:  1950/12/25, LOS: 5 ADMISSION DATE:  08/09/2018, CONSULTATION DATE:  08/10/2018 REFERRING MD:  Sharon Seller, CHIEF COMPLAINT:  Dyspnea   Brief History   68 y/o male admitted with acute respiratory failure with hypoxemia due to COVID 19.  Intubated on 4/23.  Past Medical History  CVA, Hypertension, Hyperlipidemia  Significant Hospital Events   4/22-  S/p Tcoulizimab x 1  4/23- Transfer to intensive care due to increasing lethargy and inability to protect airway. Started solumedrol x 3 days  Consults:  none  Procedures:  08/10/2018 orotracheal tube>> 08/10/2018 gastric tube>> 08/10/2018 central line>> 08/10/2018 arterial line>>  Significant Diagnostic Tests:    Micro Data:  Bcx 4/22 > SARS COV2 4/22 > Positive Sputum 4/23 >  Urine strep pneumo 4/23- negative  Antimicrobials:  Ceftriaxone 4/23 > Azithromycin 4/23 >  Interim history/subjective:  Extubated 4/27 No complaints this morning Tolerating diet well  Objective   Blood pressure (!) 145/96, pulse 74, temperature 98.6 F (37 C), temperature source Oral, resp. rate (!) 22, height  (1.753 m), weight 79.2 kg, SpO2 93 %.    FiO2 (%):  [100 %] 100 %   Intake/Output Summary (Last 24 hours) at 08/14/2018 0827 Last data filed at 08/14/2018 4098 Gross per 24 hour  Intake 459.65 ml  Output 1220 ml  Net -760.35 ml   Filed Weights   08/09/18 2136 08/12/18 0330  Weight: 77.9 kg 79.2 kg    Examination:  General:  Resting comfortably in bed HENT: NCAT OP clear PULM: CTA B, normal effort CV: RRR, no mgr GI: BS+, soft, nontender MSK: normal bulk and tone Neuro: awake, alert, no distress, MAEW     Resolved Hospital Problem list     Assessment & Plan:  ARDS due to COVID 19 : resolved History of stroke Dysphagia diet, SLP following Monitor O2 saturation Ambulate as able, PT consult    Need for sedation for synchrony with mechanical ventilation:  resolved Stop sedation Frequent orientation  To floor  Best practice:  Diet: tube feeding Pain/Anxiety/Delirium protocol (if indicated):n/a VAP protocol (if indicated): n/a DVT prophylaxis: lovenox GI prophylaxis: famotidine Glucose control: per TRH Mobility: bed rest Code Status: full Family Communication: will contact today Disposition: remain in ICU  Labs   CBC: Recent Labs  Lab 08/09/18 1604 08/09/18 2023 08/10/18 0125  08/11/18 0247  08/11/18 1558 08/12/18 0150 08/12/18 0357 08/13/18 0215 08/14/18 0510  WBC 8.8 6.3 6.5  --  6.3  --   --  12.2*  --  10.3 7.9  NEUTROABS 7.2 5.0 5.4  --  6.1  --   --   --   --   --   --   HGB 7.9* 13.7  13.6 13.6   < > 13.3   < > 12.6* 12.7* 12.2* 12.5* 13.1  HCT 23.4* 39.6 39.1   < > 39.6   < > 37.0* 38.7* 36.0* 37.5* 40.6  MCV 95.1 90.6 88.9  --  92.3  --   --  96.3  --  95.4 94.0  PLT 224 168 181  --  246  --   --  300  --  249 280   < > = values in this interval not displayed.    Basic Metabolic Panel: Recent Labs  Lab 08/10/18 0125  08/11/18 0247  08/11/18 1558 08/12/18 0150 08/12/18 0357 08/13/18 0215 08/14/18 0510  NA 131*   < > 135   < > 138  138 139 139 139  K 4.7   < > 5.1   < > 4.6 5.0 4.8 4.7 4.1  CL 100  --  103  --   --  109  --  105 103  CO2 22  --  20*  --   --  23  --  28 27  GLUCOSE 122*  --  181*  --   --  236*  --  150* 93  BUN 12  --  19  --   --  25*  --  26* 26*  CREATININE 0.75  --  0.85  --   --  0.73  --  0.60* 0.75  CALCIUM 8.2*  --  8.3*  --   --  8.0*  --  8.3* 8.4*  MG 2.4  --  2.7*  --   --  2.6*  --   --  2.1  PHOS 3.3  --  4.3  --   --  2.5  --   --   --    < > = values in this interval not displayed.   GFR: Estimated Creatinine Clearance: 92.1 mL/min (by C-G formula based on SCr of 0.75 mg/dL). Recent Labs  Lab 08/09/18 1621 08/09/18 1849 08/09/18 2023  08/10/18 1600 08/11/18 0247 08/11/18 1029 08/12/18 0150 08/13/18 0215 08/14/18 0510  PROCALCITON  --   --  0.45  --   --    --  0.34 0.27  --   --   WBC  --   --  6.3   < >  --  6.3  --  12.2* 10.3 7.9  LATICACIDVEN 2.4* 1.9  --   --  1.4  --   --   --   --   --    < > = values in this interval not displayed.    Liver Function Tests: Recent Labs  Lab 08/09/18 1604 08/10/18 0125 08/11/18 0247 08/12/18 0150  AST 180* 152* 86* 89*  ALT 191* 166* 123* 116*  ALKPHOS 99 91 83 93  BILITOT 0.7 0.9 0.5 0.3  PROT 6.4* 6.3* 6.3* 6.0*  ALBUMIN 2.3* 2.2* 2.1* 2.4*   No results for input(s): LIPASE, AMYLASE in the last 168 hours. No results for input(s): AMMONIA in the last 168 hours.  ABG    Component Value Date/Time   PHART 7.339 (L) 08/12/2018 0357   PCO2ART 45.8 08/12/2018 0357   PO2ART 90.0 08/12/2018 0357   HCO3 24.7 08/12/2018 0357   TCO2 26 08/12/2018 0357   ACIDBASEDEF 1.0 08/12/2018 0357   O2SAT 96.0 08/12/2018 0357     Coagulation Profile: No results for input(s): INR, PROTIME in the last 168 hours.  Cardiac Enzymes: Recent Labs  Lab 08/09/18 2023 08/10/18 0125 08/11/18 0247 08/12/18 0150  CKTOTAL  --  48* 47* 27*  TROPONINI <0.03  --   --   --     HbA1C: Hemoglobin A1C  Date/Time Value Ref Range Status  05/16/2017 10:52 AM 5.4  Final    CBG: Recent Labs  Lab 08/13/18 1557 08/13/18 2003 08/13/18 2313 08/14/18 0354 08/14/18 0735  GLUCAP 80 143* 101* 93 99     Critical care time: n/a    Heber Delight, MD [place] [facility] Pager: [phone] Cell: [phone] If no response, call [phone]

## 2018-08-14 NOTE — Progress Notes (Signed)
Report called to Rosalita Chessman, RN for patient transfer to PCU bed 172.

## 2018-08-14 NOTE — Progress Notes (Signed)
Pt transferred from ICU to Progressive floor this afternoon. He has been restless in the bed since transfer. He has had 1 incontinent episode.

## 2018-08-14 NOTE — Progress Notes (Signed)
Nutrition Follow-up RD working remotely.  DOCUMENTATION CODES:   Not applicable  INTERVENTION:    Magic cup BID with meals, each supplement provides 290 kcal and 9 grams of protein  Vital Cuisine Shake once daily with a meal, each supplement provides 520 kcal and 22 grams of protein  NUTRITION DIAGNOSIS:   Inadequate oral intake related to dysphagia as evidenced by meal completion < 50%.  Ongoing  GOAL:   Patient will meet greater than or equal to 90% of their needs  Progressing  MONITOR:   PO intake, Supplement acceptance  ASSESSMENT:   68 yo male with PMH of HLD, stroke, seizure who was admitted 4/22 with respiratory failure, COVID-19 positive. Required transfer to the ICU and intubation on 4/23.  Extubated 4/26. S/P SLP evaluation 4/26, diet advanced to dysphagia 2 with nectar thick liquids. Patient is consuming 25-50% of meals.  Labs and medications reviewed.   Mild-moderate pitting edema present per RN documentation.  Diet Order:   Diet Order            DIET DYS 2 Room service appropriate? Yes; Fluid consistency: Nectar Thick  Diet effective now              EDUCATION NEEDS:   No education needs have been identified at this time  Skin:  Skin Assessment: Reviewed RN Assessment  Last BM:  4/21  Height:   Ht Readings from Last 1 Encounters:  08/10/18 5\' 9"  (1.753 m)    Weight:   Wt Readings from Last 1 Encounters:  08/12/18 79.2 kg    Ideal Body Weight:  72.7 kg  BMI:  Body mass index is 25.8 kg/m.  Estimated Nutritional Needs:   Kcal:  1900-2100  Protein:  95-115 gm  Fluid:  >/= 1.9 L    Joaquin Courts, RD, LDN, CNSC Pager [phone] After Hours Pager [phone]

## 2018-08-14 NOTE — Progress Notes (Signed)
SLP Cancellation Note  Patient Details Name: Romio Deford MRN: [id number] DOB: 1950/10/23   Cancelled treatment:        Pt transferring out of ICU; will f/u next date.    Blenda Mounts Laurice 08/14/2018, 4:45 PM

## 2018-08-14 NOTE — Telephone Encounter (Signed)
Called today to check on patient's wife and family. Spoke with daughter who states that they are staying strong.Wife and the rest of the family members are doing well and feeling better. They voiced appreciation for the call.

## 2018-08-15 DIAGNOSIS — E785 Hyperlipidemia, unspecified: Secondary | ICD-10-CM

## 2018-08-15 NOTE — Plan of Care (Signed)

## 2018-08-15 NOTE — Progress Notes (Signed)
Speech Language Pathology Dysphagia Treatment Patient Details Name: Mark Robles MRN: [id number] DOB: 14-Jun-1950 Today's Date: 08/15/2018 Time: 6837-2902 SLP Time Calculation (min) (ACUTE ONLY): 30 min  Assessment / Plan / Recommendation Clinical Impression   Stratus interpretor used during session.  Pt found in bed, chair alarm going off; XJ/DB52 monitor removed.  Reminded pt to call for help and to not get up by himself. He denied transferring to bed alone.  Assisted with repositioning and restoring lines. Pt further demonstrates impulsivity when eating/drinking, requiring assist and verbal/tactile cues to pace himself.  He tolerated initial trials of thin liquids, followed by sequential sips which elicited immediate and consistent coughing, concerning for potential aspiration.  Pt demonstrates chronic cognitive deficits after prior CVA, with probable exacerbation after this event. He requires frequent cues/reminders, and demonstrates poor recall of safety precautions, poor insight into their necessity.  For now, continue dysphagia 2 diet, nectar liquids.  Anticipate improvements in swallow.  Pt may benefit from cognitive assessment.       Diet Recommendation    dysphagia 2, nectars   SLP Plan Continue with current plan of care       Swallowing Goals     General Behavior/Cognition: Alert;Confused;Impulsive Patient Positioning: Upright in bed Oral care provided: N/A HPI: 68 y/o male admitted with acute respiratory failure with hypoxemia due to COVID 19.  Intubated on 4/23-4/26. Pt has a history of CVA, wife reprots acute dysphagia only immediately after CVA, resolved and pt was able to eat and drink regular textures. He has some trouble with his teeth so he avoid tough meats. He eats very quickly and cannot look at his food while he is eating because he gets dizzy when he looks down. Persistent cognitve impairment includes decreased awareness, short term memory and  depression per family.   Oral Cavity - Oral Hygiene     Dysphagia Treatment Treatment Methods: Skilled observation;Upgraded PO texture trial Patient observed directly with PO's: Yes Type of PO's observed: Thin liquids;Nectar-thick liquids Feeding: Able to feed self Liquids provided via: Straw;Cup Pharyngeal Phase Signs & Symptoms: Delayed cough Type of cueing: Verbal Amount of cueing: Minimal   GO     Mark Robles 08/15/2018, 5:32 PM  Mark Robles L. Samson Frederic, MA CCC/SLP Acute Rehabilitation Services Office number [phone] CGV iphone [phone]

## 2018-08-15 NOTE — Progress Notes (Signed)
OT Progress Note  Pt seen for additional session to ambulate to bathroom using HHA/furniture walking. Pt able to ambulate @ 25 ft to toilet with VSS. SpO2 97 RA. [employer] Interpreter used during session. [facility] given his abnormal gait/hemiparetic pattern. Will follow acutely.    08/15/18 1500  OT Visit Information  Last OT Received On 08/15/18  Assistance Needed +1  History of Present Illness 68yo Spanish-speaking male w/ a hx of HTN, CVA (2011 > L sided weakness & poor short term memory), and HLD who presented w/ 7 days of fever/chills, sweats, and atypical chest pain and was found to have COVID-19. He was intubated for resp failure. He had been seen in an UC on 4/15, and was sent home w/ Levaquin and a diagnosis of PNA. His wife works at [employer] (ch[employer] farm) [place], and had sx concerning for COVID prior to his illness. Intubated 4/23/extubated 4/26.   Precautions  Precautions Fall  Precaution Comments L spastic hemiparesis  Pain Assessment  Pain Assessment Faces  Faces Pain Scale 2  Pain Location general discomfort  Pain Descriptors / Indicators Discomfort  Pain Intervention(s) Limited activity within patient's tolerance  Cognition  Arousal/Alertness Awake/alert  Behavior During Therapy WFL for tasks assessed/performed  Overall Cognitive Status History of cognitive impairments - at baseline  ADL  Eating/Feeding Set up;Supervision/ safety (set up for lunch)  Toilet Transfer Minimal assistance;Ambulation  General ADL Comments Pt ambulated to toilet with Min A; Pt reaching for wall/rack. Most likely furniture walks at home. able to clean self after having a BM.   Balance  Standing balance-Leahy Scale Poor  Transfers  Overall transfer level Needs assistance  Transfers Sit to/from Stand  Sit to Stand Min assist  General transfer comment Improved mobility once up and ambulating  OT - End of Session  Equipment Utilized During Treatment Gait  belt  Activity Tolerance Patient tolerated treatment well  Patient left in chair;with call bell/phone within reach  Nurse Communication Mobility status;Other (comment)  OT Assessment/Plan  OT Plan Discharge plan remains appropriate  OT Visit Diagnosis Other abnormalities of gait and mobility (R26.89);Muscle weakness (generalized) (M62.81);Other symptoms and signs involving cognitive function;Dizziness and giddiness (R42);Hemiplegia and hemiparesis  Hemiplegia - Right/Left Left  Hemiplegia - dominant/non-dominant Non-Dominant  Hemiplegia - caused by Cerebral infarction  OT Frequency (ACUTE ONLY) Min 3X/week  Follow Up Recommendations Home health OT;Supervision/Assistance - 24 hour  OT Equipment 3 in 1 bedside commode;Tub/shower bench  AM-PAC OT "6 Clicks" Daily Activity Outcome Measure (Version 2)  Help from another person eating meals? 3  Help from another person taking care of personal grooming? 3  Help from another person toileting, which includes using toliet, bedpan, or urinal? 3  Help from another person bathing (including washing, rinsing, drying)? 2  Help from another person to put on and taking off regular upper body clothing? 2  Help from another person to put on and taking off regular lower body clothing? 2  6 Click Score 15  OT Goal Progression  Progress towards OT goals Progressing toward goals  Acute Rehab OT Goals  Patient Stated Goal to go home  OT Goal Formulation With patient/family  Time For Goal Achievement 08/29/18  Potential to Achieve Goals Good  ADL Goals  Pt Will Perform Eating with set-up;sitting  Pt Will Transfer to Toilet with modified independence;ambulating  Pt Will Perform Toileting - Clothing Manipulation and hygiene with modified independence;sit to/from stand  Additional ADL Goal #1 Pt/family  will verbalize 3 strategies to reduce risk of falls  OT Time Calculation  OT Start Time (ACUTE ONLY) 1035  OT Stop Time (ACUTE ONLY) 1130  OT Time Calculation  (min) 55 min  OT General Charges  $OT Visit 1 Visit  OT Treatments  $Self Care/Home Management  38-52 mins  Luisa Dago, OT/L   Acute OT Clinical Specialist Acute Rehabilitation Services Pager [phone] Office [phone]

## 2018-08-15 NOTE — Progress Notes (Signed)
Mark Robles TEAM 1 - Stepdown/ICU TEAM  Mark CraftGuadalupe Rodriguez Robles  ZOX:[id number] DOB: 06/12/1950 DOA: 08/09/2018 PCP: Mike Gipouglas, Andre, FNP    Brief Narrative:  68yo Spanish-speaking male w/ a hx of HTN, CVA (2011 > L sided weakness & poor short term memory), and HLD who presented w/ 7 days of fever/chills, sweats, and atypical chest pain and was found to have COVID-19. He was intubated for resp failure. He had been seen in an UC on 4/15, and was sent home w/ Levaquin and a diagnosis of PNA. His wife works at [employer] (ch[employer] farm) in [place], and had sx concerning for COVID prior to his illness.   Significant Events: 4/22 admit to [facility] - S/p Tocilizimab x 1  4/23 tx to ICU w/ increasing lethargy and inability to protect airway - intubated - CVL placed  [facility]26 extubated  COVID-19 specific Treatment: tocilizumub 4/22  Subjective: Patient was extubated on 4/26.  He remains mildly confused.  Does not speak much [place].  Currently noted to be off of oxygen.  Assessment & Plan:  Acute Hypoxic Respiratory failure - COVID 19 - ARDS Patient was on the mechanical ventilation.  He improved rapidly.  He was extubated on 4/26.  Transfer to the floor yesterday.  Seems to be doing well.  PT and OT evaluation.  Speech therapy to follow.  Currently on a dysphagia 2 diet.  Incentive spirometry.  Prone positioning as much as possible.  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  Chronically on Plavix and statin.  Noted to have left-sided deficits.  PT and OT evaluation.    History of DVT Apparently treated with Eliquis for about 2 years.  Follow-up Doppler study in July 2019 was negative for DVT.  Was not on anticoagulation at the time of admission.  Currently on prophylactic doses.    Hyperlipidemia Resumed crestor   Shock, likely septic Resolved   Chronic sinus brady [facility] w/ holter monitor   DVT prophylaxis: lovenox  Code  Status: FULL CODE Family Communication: We will try to call family today Disposition Plan: PT and OT evaluation.  Patient remains stable.  Consultants:  PCCM  Antimicrobials:  Ceftriaxone 4/23 > 4/24 Azithromycin 4/24  Objective: Blood pressure 116/76, pulse 65, temperature 98.2 F (36.8 C), temperature source Oral, resp. rate 14, height 5\' 9"  (1.753 m), weight 74.1 kg, SpO2 93 %. No intake or output data in the 24 hours ending 08/15/18 1144 Filed Weights   08/09/18 2136 08/12/18 0330 08/15/18 0500  Weight: 77.9 kg 79.2 kg 74.1 kg    Examination:  General appearance: Awake alert.  In no distress.  Mildly distracted Resp: Clear to auscultation bilaterally.  Normal effort Cardio: S1-S2 is normal regular.  No S3-S4.  No rubs murmurs or bruit GI: Abdomen is soft.  Nontender nondistended.  Bowel sounds are present normal.  No masses organomegaly Extremities: No edema.  Full range of motion of lower extremities. Neurologic: Left-sided hemiparesis noted.    CBC: Recent Labs  Lab 08/09/18 2023 08/10/18 0125  08/11/18 0247  08/12/18 0150 08/12/18 0357 08/13/18 0215 08/14/18 0510  WBC 6.3 6.5  --  6.3  --  12.2*  --  10.3 7.9  NEUTROABS 5.0 5.4  --  6.1  --   --   --   --   --   HGB 13.7  13.6 13.6   < > 13.3   < > 12.7* 12.2* 12.5* 13.1  HCT 39.6 39.1   < > 39.6   < > 38.7* 36.0* 37.5* 40.6  MCV 90.6 88.9  --  92.3  --  96.3  --  95.4 94.0  PLT 168 181  --  246  --  300  --  249 280   < > = values in this interval not displayed.   Basic Metabolic Panel: Recent Labs  Lab 08/10/18 0125  08/11/18 0247  08/12/18 0150 08/12/18 0357 08/13/18 0215 08/14/18 0510  NA 131*   < > 135   < > 138 139 139 139  K 4.7   < > 5.1   < > 5.0 4.8 4.7 4.1  CL 100  --  103  --  109  --  105 103  CO2 22  --  20*  --  23  --  28 27  GLUCOSE 122*  --  181*  --  236*  --  150* 93  BUN 12  --  19  --  25*  --  26* 26*  CREATININE 0.75  --  0.85  --  0.73  --  0.60* 0.75  CALCIUM 8.2*  --   8.3*  --  8.0*  --  8.3* 8.4*  MG 2.4  --  2.7*  --  2.6*  --   --  2.1  PHOS 3.3  --  4.3  --  2.5  --   --   --    < > = values in this interval not displayed.   GFR: Estimated Creatinine Clearance: 92.1 mL/min (by C-G formula based on SCr of 0.75 mg/dL).  Liver Function Tests: Recent Labs  Lab 08/09/18 1604 08/10/18 0125 08/11/18 0247 08/12/18 0150  AST 180* 152* 86* 89*  ALT 191* 166* 123* 116*  ALKPHOS 99 91 83 93  BILITOT 0.7 0.9 0.5 0.3  PROT 6.4* 6.3* 6.3* 6.0*  ALBUMIN 2.3* 2.2* 2.1* 2.4*    Cardiac Enzymes: Recent Labs  Lab 08/09/18 2023 08/10/18 0125 08/11/18 0247 08/12/18 0150  CKTOTAL  --  48* 47* 27*  TROPONINI <0.03  --   --   --     HbA1C: Hemoglobin A1C  Date/Time Value Ref Range Status  05/16/2017 10:52 AM 5.4  Final    CBG: Recent Labs  Lab 08/13/18 1557 08/13/18 2003 08/13/18 2313 08/14/18 0354 08/14/18 0735  GLUCAP 80 143* 101* 93 99    Recent Results (from the past 240 hour(s))  Culture, blood (routine x 2)     Status: None   Collection Time: 08/09/18  4:00 PM  Result Value Ref Range Status   Specimen Description BLOOD SITE NOT SPECIFIED  Final   Special Requests   Final    BOTTLES DRAWN AEROBIC AND ANAEROBIC Blood Culture adequate volume   Culture   Final    NO GROWTH 5 DAYS Performed at [facility], [address], [place], [place] [address]    Report Status 08/14/2018 FINAL  Final  SARS Coronavirus 2 [facility] order, Performed in [facility] hospital lab)     Status: Abnormal   Collection Time: 08/09/18  4:11 PM  Result Value Ref Range Status   SARS Coronavirus 2 POSITIVE (A) NEGATIVE Final    Comment: RESULT CALLED TO, READ BACK BY AND VERIFIED WITH: Particia Nearing RN 1754 08/09/18 A BROWNING (NOTE) If result is NEGATIVE SARS-CoV-2 target nucleic acids are NOT DETECTED. The SARS-CoV-2 RNA is generally detectable in upper and lower  respiratory specimens during the  acute phase of infection. The lowest  concentration  of SARS-CoV-2 viral copies this assay can detect is 250  copies / mL. A negative result does not preclude SARS-CoV-2 infection  and should not be used as the sole basis for treatment or other  patient management decisions.  A negative result may occur with  improper specimen collection / handling, submission of specimen other  than nasopharyngeal swab, presence of viral mutation(s) within the  areas targeted by this assay, and inadequate number of viral copies  (<250 copies / mL). A negative result must be combined with clinical  observations, patient history, and epidemiological information. If result is POSITIVE SARS-CoV-2 target nucleic acids are DETECTED.  The SARS-CoV-2 RNA is generally detectable in upper and lower  respiratory specimens during the acute phase of infection.  Positive  results are indicative of active infection with SARS-CoV-2.  Clinical  correlation with patient history and other diagnostic information is  necessary to determine patient infection status.  Positive results do  not rule out bacterial infection or co-infection with other viruses. If result is PRESUMPTIVE POSTIVE SARS-CoV-2 nucleic acids MAY BE PRESENT.   A presumptive positive result was obtained on the submitted specimen  and confirmed on repeat testing.  While 2019 novel coronavirus  (SARS-CoV-2) nucleic acids may be present in the submitted sample  additional confirmatory testing may be necessary for epidemiological  and / or clinical management purposes  to differentiate between  SARS-CoV-2 and other Sarbecovirus currently known to infect humans.  If clinically indicated additional testing with an alternate test  methodology [phone])  is advised. The SARS-CoV-2 RNA is generally  detectable in upper and lower respiratory specimens during the acute  phase of infection. The expected result is Negative. Fact Sheet for Patients:  BoilerBrush.com.cy Fact Sheet for Healthcare  Providers: https://pope.com/ This test is not yet approved or cleared by the [place] FDA and has been authorized for detection and/or diagnosis of SARS-CoV-2 by FDA under an Emergency Use Authorization (EUA).  This EUA will remain in effect (meaning this test can be used) for the duration of the COVID-19 declaration under Section 564(b)(1) of the Act, 21 U.S.C. section 360bbb-3(b)(1), unless the authorization is terminated or revoked sooner. Performed at [facility], [address] [place], [place] [address]   Culture, blood (routine x 2)     Status: None   Collection Time: 08/09/18  8:30 PM  Result Value Ref Range Status   Specimen Description BLOOD RIGHT ARM  Final   Special Requests   Final    BOTTLES DRAWN AEROBIC ONLY Blood Culture results may not be optimal due to an excessive volume of blood received in culture bottles   Culture   Final    NO GROWTH 5 DAYS Performed at [facility], [address], [place], [place] [address]    Report Status 08/14/2018 FINAL  Final  MRSA PCR Screening     Status: None   Collection Time: 08/10/18 12:03 PM  Result Value Ref Range Status   MRSA by PCR NEGATIVE NEGATIVE Final    Comment:        The GeneXpert MRSA Assay (FDA approved for NASAL specimens only), is one component of a comprehensive MRSA colonization surveillance program. It is not intended to diagnose MRSA infection nor to guide or monitor treatment for MRSA infections. Performed at [facility], [address], [place], [place] [address]   Culture, respiratory (non-expectorated)     Status: None  Collection Time: 08/10/18  2:45 PM  Result Value Ref Range Status   Specimen Description TRACHEAL ASPIRATE  Final   Special Requests NONE  Final   Gram Stain   Final    NO SQUAMOUS EPITHELIAL CELLS PRESENT FEW WBC PRESENT,BOTH PMN AND MONONUCLEAR FEW GRAM POSITIVE COCCI IN PAIRS IN CLUSTERS RARE GRAM NEGATIVE RODS FEW GRAM  NEGATIVE COCCOBACILLI    Culture   Final    Consistent with normal respiratory flora. Performed at [facility], [address] [place], [place] [address]    Report Status 08/12/2018 FINAL  Final     Scheduled Meds: . Chlorhexidine Gluconate Cloth  6 each Topical Q0600  . clopidogrel  75 mg Oral Daily  . enoxaparin (LOVENOX) injection  0.5 mg/kg Subcutaneous Q12H  . famotidine  20 mg Oral BID  . FLUoxetine  20 mg Oral Daily  . rosuvastatin  20 mg Oral Daily     LOS: 6 days   Osvaldo Shipper  [facility] Office  [phone] Pager - Text Page per Loretha Stapler  If 7PM-7AM, please contact night-coverage per Amion 08/15/2018, 11:44 AM

## 2018-08-15 NOTE — Progress Notes (Signed)
Occupational Therapy Evaluation Patient Details Name: Mark Robles MRN: [id number] DOB: [birth year] Today's Date: 08/15/2018    History of Present Illness 68yo Spanish-speaking male w/ a hx of HTN, CVA (2011 > L sided weakness & poor short term memory), and HLD who presented w/ 7 days of fever/chills, sweats, and atypical chest pain and was found to have COVID-19. He was intubated for resp failure. He had been seen in an UC on 4/15, and was sent home w/ Levaquin and a diagnosis of PNA. His wife works at [employer] (chicken farm) in [place], and had sx concerning for COVID prior to his illness. Intubated 4/23/extubated 4/26.    Clinical Impression   PTA, pt lived at home with his family. Pt was alone during the day and was able to ambulate around the house with use of a cane/furniture walking. Pt has a history of "dizziness" since his CVA. Daughter staes he takes medicine for his dizziness 3x/day, everyday. Pt able to progress to chair and assist with ADL tasks, however limited by dizziness. Interpreter Brooke Dare [phone]) used during session. Spoke with family over the phone and they confirmed that they would be able to assist pt at his current level. Pt talked with his family over the phone and was very appreciative. Asked nurse to give pt his Antivert for vertigo. Recommend DC home with HHOT. Will follow acutely.     Follow Up Recommendations  Home health OT;Supervision/Assistance - 24 hour    Equipment Recommendations  3 in 1 bedside commode;Tub/shower bench    Recommendations for Other Services       Precautions / Restrictions Precautions Precautions: Fall Precaution Comments: L spastic hemiparesis      Mobility Bed Mobility Overal bed mobility: Needs Assistance Bed Mobility: Supine to Sit     Supine to sit: Min assist        Transfers Overall transfer level: Needs assistance   Transfers: Sit to/from Stand Sit to Stand: Min assist         General  transfer comment: Pt initially unsteady; improved once up and mobilizing; cued to take "big step" with use of interpreter    Balance Overall balance assessment: Needs assistance   Sitting balance-Leahy Scale: Fair       Standing balance-Leahy Scale: Poor Standing balance comment: dependent on external support                           ADL either performed or assessed with clinical judgement   ADL Overall ADL's : Needs assistance/impaired Eating/Feeding: Set up;Supervision/ safety;Sitting(nectar)   Grooming: Minimal assistance;Sitting   Upper Body Bathing: Minimal assistance;Sitting   Lower Body Bathing: Moderate assistance;Sit to/from stand   Upper Body Dressing : Moderate assistance;Sitting   Lower Body Dressing: Moderate assistance;Sit to/from stand   Toilet Transfer: Minimal assistance;Ambulation Toilet Transfer Details (indicate cue type and reason): HHA with R hand; cane not available during session Toileting- Clothing Manipulation and Hygiene: Minimal assistance;Sit to/from stand       Functional mobility during ADLs: Minimal assistance General ADL Comments: Pt complains of being dizzy. States Dizziness is worse than normal. Pt is very shy.      Vision Baseline Vision/History: Wears glasses Additional Comments: unsure if pt has glasses     Perception     Praxis      Pertinent Vitals/Pain Pain Assessment: Faces Faces Pain Scale: Hurts a little bit Pain Location: general discomfort Pain Descriptors / Indicators: Discomfort Pain Intervention(s):  Limited activity within patient's tolerance     Hand Dominance Right   Extremity/Trunk Assessment Upper Extremity Assessment Upper Extremity Assessment: RUE deficits/detail;LUE deficits/detail RUE Deficits / Details: generalized weakness, but using funcitonally LUE Deficits / Details: spatic hemiparesis; contractures of elbow/wrist/hand in flexion; non-functional   Lower Extremity Assessment Lower  Extremity Assessment: Defer to PT evaluation   Cervical / Trunk Assessment Cervical / Trunk Assessment: Other exceptions;Kyphotic(R bias - at basleine) Cervical / Trunk Exceptions: forward head   Communication Communication Communication: Prefers language other than English(Spanish)   Cognition Arousal/Alertness: Awake/alert Behavior During Therapy: WFL for tasks assessed/performed Overall Cognitive Status: History of cognitive impairments - at baseline                                 General Comments: Pt spoke with family over the phone. 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    Exercises     Shoulder Instructions      Home Living Family/patient expects to be discharged to:: Private residence Living Arrangements: Children Available Help at Discharge: Family;Available 24 hours/day Type of Home: House Home Access: Stairs to enter Enter[employer] CorporationEntrance Stairs-Number of Steps: 4   Home Layout: One level     Bathroom Shower/Tub: Chief Strategy OfficerTub/shower unit   Bathroom Toilet: Standard Bathroom Accessibility: Yes How Accessible: Accessible via walker Home Equipment: Cane - single point          Prior Functioning/Environment Level of Independence: Needs assistance;Independent with assistive device(s)  Gait / Transfers Assistance Needed: Ambulates with a cane in the house/holds onto walls/furniture ADL's / Homemaking Assistance Needed: wife assists with bathing/dressing as needed Communication / Swallowing Assistance Needed: speaks spanish          OT Problem List: Decreased range of motion;Decreased strength;Decreased activity tolerance;Impaired balance (sitting and/or standing);Decreased coordination;Decreased safety awareness;Decreased cognition;Decreased knowledge of use of DME or AE;Decreased knowledge of precautions;Impaired sensation;Impaired  tone;Impaired UE functional use;Pain      OT Treatment/Interventions: Self-care/ADL training;Neuromuscular education;DME and/or AE instruction;Therapeutic activities;Cognitive remediation/compensation;Patient/family education;Balance training    OT Goals(Current goals can be found in the care plan section) Acute Rehab OT Goals Patient Stated Goal: to go home OT Goal Formulation: With patient/family Time For Goal Achievement: 08/29/18 Potential to Achieve Goals: Good  OT Frequency: Min 3X/week   Barriers to D/C:            Co-evaluation              AM-PAC OT "6 Clicks" Daily Activity     Outcome Measure Help from another person eating meals?: A Little Help from another person taking care of personal grooming?: A Little Help from another person toileting, which includes using toliet, bedpan, or urinal?: A Little Help from another person bathing (including washing, rinsing, drying)?: A Lot Help from another person to put on and taking off regular upper body clothing?: A Lot Help from another person to put on and taking off regular lower body clothing?: A Lot 6 Click Score: 15   End of Session Equipment Utilized During Treatment: Gait belt Nurse Communication: Mobility status;Other (comment)(use of Anti vert for dizziness/ordered PRN)  Activity Tolerance: Patient tolerated treatment well Patient left: in chair;with call bell/phone within reach;with chair alarm set;Other (comment)(Baby Monitor set up)  OT Visit Diagnosis: Other abnormalities of gait and mobility (R26.89);Muscle weakness (generalized) (M62.81);Other  symptoms and signs involving cognitive function;Dizziness and giddiness (R42);Hemiplegia and hemiparesis Hemiplegia - Right/Left: Left Hemiplegia - dominant/non-dominant: Non-Dominant Hemiplegia - caused by: Cerebral infarction(previous CVA)                Time: 0940-1020 OT Time Calculation (min): 40 min Charges:  OT General Charges $OT Visit: 1 Visit OT  Evaluation $OT Eval Moderate Complexity: 1 Mod OT Treatments $Self Care/Home Management : 8-22 mins  Luisa Dago, OT/L   Acute OT Clinical Specialist Acute Rehabilitation Services Pager [phone] Office [phone]   [facility] 08/15/2018, 2:52 PM

## 2018-08-16 LAB — COMPREHENSIVE METABOLIC PANEL
ALT: 107 U/L — ABNORMAL HIGH (ref 0–44)
AST: 78 U/L — ABNORMAL HIGH (ref 15–41)
Albumin: 3.3 g/dL — ABNORMAL LOW (ref 3.5–5.0)
Alkaline Phosphatase: 64 U/L (ref 38–126)
Anion gap: 7 (ref 5–15)
BUN: 18 mg/dL (ref 8–23)
CO2: 26 mmol/L (ref 22–32)
Calcium: 8.5 mg/dL — ABNORMAL LOW (ref 8.9–10.3)
Chloride: 100 mmol/L (ref 98–111)
Creatinine, Ser: 0.84 mg/dL (ref 0.61–1.24)
GFR calc Af Amer: >60 mL/min (ref >60)
GFR calc non Af Amer: >60 mL/min (ref >60)
Glucose, Bld: 93 mg/dL (ref 70–99)
Potassium: 4.3 mmol/L (ref 3.5–5.1)
Sodium: 133 mmol/L — ABNORMAL LOW (ref 135–145)
Total Bilirubin: 0.5 mg/dL (ref 0.3–1.2)
Total Protein: 6.5 g/dL (ref 6.5–8.1)

## 2018-08-16 LAB — CBC
HCT: 43.8 % (ref 39.0–52.0)
Hemoglobin: 14.3 g/dL (ref 13.0–17.0)
MCH: 30.2 pg (ref 26.0–34.0)
MCHC: 32.6 g/dL (ref 30.0–36.0)
MCV: 92.6 fL (ref 80.0–100.0)
Platelets: 359 10*3/uL (ref 150–400)
RBC: 4.73 MIL/uL (ref 4.22–5.81)
RDW: 14 % (ref 11.5–15.5)
WBC: 5.6 10*3/uL (ref 4.0–10.5)
nRBC: 0 % (ref 0.0–0.2)

## 2018-08-16 LAB — GLUCOSE, CAPILLARY: Glucose-Capillary: 79 mg/dL (ref 70–99)

## 2018-08-16 NOTE — Progress Notes (Addendum)
Occupational Therapy Treatment Patient Details Name: Mark Robles MRN: [id number] DOB: 01/10/1951 Today's Date: 08/16/2018    History of present illness 68yo Spanish-speaking male w/ a hx of HTN, CVA (2011 > L sided weakness & poor short term memory), and HLD who presented w/ 7 days of fever/chills, sweats, and atypical chest pain and was found to have COVID-19. He was intubated for resp failure. He had been seen in an UC on 4/15, and was sent home w/ Levaquin and a diagnosis of PNA. His wife works at [employer] (chicken farm) in [place], and had sx concerning for COVID prior to his illness. Intubated 4/23/extubated 4/26.    OT comments  Pt progressing well towards established OT goals. Pt performing functional mobility with SPC and Min A for decreased balance. Pt eating his breakfast with supervision and set up while sitting in recliner. SpO2 >96% on RA. Continue to recommend dc to home with HHOT and will continue to follow acutely as admitted.   [employer] interpreter used throughout session   Follow Up Recommendations  Home health OT;Supervision/Assistance - 24 hour    Equipment Recommendations  3 in 1 bedside commode    Recommendations for Other Services      Precautions / Restrictions Precautions Precautions: Fall Precaution Comments: L spastic hemiparesis Restrictions Weight Bearing Restrictions: No       Mobility Bed Mobility Overal bed mobility: Needs Assistance Bed Mobility: Supine to Sit     Supine to sit: Supervision     General bed mobility comments: extra time to move to the left.   Transfers Overall transfer level: Needs assistance Equipment used: Straight cane Transfers: Sit to/from Stand Sit to Stand: Min guard         General transfer comment: Min Guard A for safety.     Balance Overall balance assessment: Needs assistance Sitting-balance support: No upper extremity supported;Feet supported Sitting balance-Leahy Scale: Fair      Standing balance support: Single extremity supported;During functional activity Standing balance-Leahy Scale: Poor Standing balance comment: dependent on external support                           ADL either performed or assessed with clinical judgement   ADL Overall ADL's : Needs assistance/impaired Eating/Feeding: Set up;Supervision/ safety Eating/Feeding Details (indicate cue type and reason): Set up for breakfast in recliner             Upper Body Dressing : Minimal assistance;Sitting Upper Body Dressing Details (indicate cue type and reason): Min A for donning new gown Lower Body Dressing: Maximal assistance;Sit to/from stand Lower Body Dressing Details (indicate cue type and reason): Max A for donning sock Toilet Transfer: Minimal assistance;Ambulation(SPC) Toilet Transfer Details (indicate cue type and reason): Min A for safety         Functional mobility during ADLs: Min guard;Minimal assistance;Cane General ADL Comments: Pt performing home distance functional mobility with SPC and Min A.      Vision Baseline Vision/History: Wears glasses     Perception     Praxis      Cognition Arousal/Alertness: Awake/alert Behavior During Therapy: WFL for tasks assessed/performed Overall Cognitive Status: History of cognitive impairments - at baseline                                 General Comments: patient converses WNL with interpreter. Initially patient  did not want  to get up.        Exercises     Shoulder Instructions       General Comments SpO2 >96% on RA    Pertinent Vitals/ Pain       Pain Assessment: No/denies pain  Home Living Family/patient expects to be discharged to:: Private residence Living Arrangements: Children Available Help at Discharge: Family;Available 24 hours/day Type of Home: House Home Access: Stairs to enter [employer] of Steps: 4   Home Layout: One level     Bathroom Shower/Tub:  Chief Strategy Officer: Standard Bathroom Accessibility: Yes How Accessible: Accessible via walker Home Equipment: Cane - single point          Prior Functioning/Environment Level of Independence: Needs assistance;Independent with assistive device(s)  Gait / Transfers Assistance Needed: Ambulates with a cane in the house/holds onto walls/furniture ADL's / Homemaking Assistance Needed: wife assists with bathing/dressing as needed Communication / Swallowing Assistance Needed: speaks spanish Comments: inyerpreter  services used, name  and number not recorded   Frequency  Min 3X/week        Progress Toward Goals  OT Goals(current goals can now be found in the care plan section)  Progress towards OT goals: Progressing toward goals  Acute Rehab OT Goals Patient Stated Goal: to go home OT Goal Formulation: With patient/family Time For Goal Achievement: 08/29/18 Potential to Achieve Goals: Good ADL Goals Pt Will Perform Eating: with set-up;sitting Pt Will Transfer to Toilet: with modified independence;ambulating Pt Will Perform Toileting - Clothing Manipulation and hygiene: with modified independence;sit to/from stand Additional ADL Goal #1: Pt/family will verbalize 3 strategies to reduce risk of falls  Plan Discharge plan remains appropriate    Co-evaluation    PT/OT/SLP Co-Evaluation/Treatment: Yes Reason for Co-Treatment: Complexity of the patient's impairments (multi-system involvement);For patient/therapist safety;To address functional/ADL transfers PT goals addressed during session: Mobility/safety with mobility OT goals addressed during session: ADL's and self-care      AM-PAC OT "6 Clicks" Daily Activity     Outcome Measure   Help from another person eating meals?: A Little Help from another person taking care of personal grooming?: A Little Help from another person toileting, which includes using toliet, bedpan, or urinal?: A Little Help from  another person bathing (including washing, rinsing, drying)?: A Lot Help from another person to put on and taking off regular upper body clothing?: A Lot Help from another person to put on and taking off regular lower body clothing?: A Lot 6 Click Score: 15    End of Session Equipment Utilized During Treatment: Gait belt;Other (comment)(SPC)  OT Visit Diagnosis: Other abnormalities of gait and mobility (R26.89);Muscle weakness (generalized) (M62.81);Other symptoms and signs involving cognitive function;Dizziness and giddiness (R42);Hemiplegia and hemiparesis Hemiplegia - Right/Left: Left Hemiplegia - dominant/non-dominant: Non-Dominant Hemiplegia - caused by: Cerebral infarction   Activity Tolerance Patient tolerated treatment well   Patient Left in chair;with call bell/phone within reach   Nurse Communication Mobility status;Other (comment)(wanting medication for nausea)        Time: 2778-2423 OT Time Calculation (min): 46 min  Charges: OT General Charges $OT Visit: 1 Visit OT Treatments $Self Care/Home Management : 8-22 mins  Damany Eastman MSOT, OTR/L Acute Rehab Pager: [phone] Office: [phone]   Theodoro Grist Delbert Darley 08/16/2018, 1:47 PM

## 2018-08-16 NOTE — TOC Transition Note (Addendum)
Transition of Care [facility]) - CM/SW Discharge Note   Patient Details  Name: Mark Robles MRN: [id number] Date of Birth: Jul 23, 1950  Transition of Care [facility]) CM/SW Contact:  Colleen Can RN, BSN, NCM-BC, ACM-RN [phone] (working remotely) Phone Number: 08/16/2018, 12:26 PM   Clinical Narrative:    CM following for dispositional needs. CM spoke to patients daughter, Joylene Grapes (verbal permission received from patient on 4/23) to discuss the POC. Patient lives at home with his spouse/family, was independent with his ADLs PTA; has a SPC to use during ambulation. PCP verified as Mike G[facility] daughter verified patient has no insurance, with family able to provide 24hr assist. PT/OT eval complete with HHPT/OT/ST and BSC. CMS HH Compare list provided with no preference; [facility] selected for [facility] COVID patients/[employer] for DME; AVS updated. CM spoke to [place], [facility], with the [facility] to be delivered to the patients room prior to transitioning home. Patients daughter stated she will provide transportation home. No further needs from CM.    Final next level of care: Home w Home Health Services Barriers to Discharge: No Barriers Identified   Patient Goals and CMS Choice Patient states their goals for this hospitalization and ongoing recovery are:: "to go home" CMS Medicare.gov Compare Post Acute Care list provided to:: Patient Represenative (must comment)(Luna (daughter)) Choice offered to / list presented to : NA  Discharge Plan and Services   Discharge Planning Services: Medication Assistance Post Acute Care Choice: NA          DME Arranged: Bedside commode DME Agency: [employer] Date DME Agency Contacted: 08/16/18 Time DME Agency Contacted: 1215 Representative spoke with at DME Agency: Christeen Douglas HH Arranged: PT, OT, Speech Therapy HH Agency: [facility] Date [facility] Agency Contacted: 08/16/18 Time HH Agency Contacted:  1050 Representative spoke with at [facility] Agency: Lorenza Chick RN liaison

## 2018-08-16 NOTE — Progress Notes (Addendum)
Occupational Therapy Treatment Patient Details Name: Mark Robles MRN: [id number] DOB: [birth year] Today's Date: 08/16/2018    History of present illness 68yo Spanish-speaking male w/ a hx of HTN, CVA (2011 > L sided weakness & poor short term memory), and HLD who presented w/ 7 days of fever/chills, sweats, and atypical chest pain and was found to have COVID-19. He was intubated for resp failure. He had been seen in an UC on 4/15, and was sent home w/ Levaquin and a diagnosis of PNA. His wife works at [employer] (chicken farm) in [place], and had sx concerning for COVID prior to his illness. Intubated 4/23/extubated 4/26.    OT comments  Returned for second visit to educate pt on use of 3N1 as shower seat and safe tub transfers. Educating pt on use of seat for energy conservation and for safety/fall prevention. Pt reporting he has used a shower seat prior. Providing demonstration of transfer techniques and set up for 3N1. Pt declining to perform transfer at this time. Also called daughter to discuss use of shower seat; daughter verbalized understanding. Continue to recommend dc home with HHOT and answered questions in preparation for dc later today.  [employer] Interpreter: Koleen Nimrod [id number]   Follow Up Recommendations  Home health OT;Supervision/Assistance - 24 hour    Equipment Recommendations  3 in 1 bedside commode    Recommendations for Other Services      Precautions / Restrictions Precautions Precautions: Fall Precaution Comments: L spastic hemiparesis Restrictions Weight Bearing Restrictions: No       Mobility Bed Mobility           Transfers              Balance                                ADL either performed or assessed with clinical judgement   ADL Overall ADL's : Needs assistance/impaired                           Tub/Shower Transfer Details (indicate cue type and reason): Returned for second visit to  discuss safety with tub transfer. Educating pt on use of 3N1 for tub transfer and to sit for showers due to decreased activity tolerance. Pt reporting he has used a 3N1 as shower chair prior and feels comfortable to perform transfer. Pt declining to practice at tub. Left handout with demonstration of use of 3N1 for tub transfers with patient  General ADL Comments: Focused session on DME and safety with tub transfer     Vision Baseline Vision/History: Wears glasses     Perception     Praxis      Cognition Arousal/Alertness: Awake/alert Behavior During Therapy: WFL for tasks assessed/performed Overall Cognitive Status: History of cognitive impairments - at baseline                                 General Comments: Pt continues to present with decreased awareness of deficits        Exercises     Shoulder Instructions       General Comments SpO2 >96% on RA    Pertinent Vitals/ Pain       Pain Assessment: No/denies pain  Home Living Family/patient expects to be discharged to:: Private residence Living Arrangements: Children Available Help at  Discharge: Family;Available 24 hours/day Type of Home: House Home Access: Stairs to enter [employer]trance Stairs-Number of Steps: 4   Home Layout: One level     Bathroom Shower/Tub: Chief Strategy OfficerTub/shower unit   Bathroom Toilet: Standard Bathroom Accessibility: Yes How Accessible: Accessible via walker Home Equipment: Cane - single point          Prior Functioning/Environment Level of Independence: Needs assistance;Independent with assistive device(s)  Gait / Transfers Assistance Needed: Ambulates with a cane in the house/holds onto walls/furniture ADL's / Homemaking Assistance Needed: wife assists with bathing/dressing as needed Communication / Swallowing Assistance Needed: speaks spanish Comments: inyerpreter  services used, name  and number not recorded   Frequency  Min 3X/week        Progress Toward Goals  OT  Goals(current goals can now be found in the care plan section)  Progress towards OT goals: Progressing toward goals  Acute Rehab OT Goals Patient Stated Goal: to go home OT Goal Formulation: With patient/family Time For Goal Achievement: 08/29/18 Potential to Achieve Goals: Good ADL Goals Pt Will Perform Eating: with set-up;sitting Pt Will Transfer to Toilet: with modified independence;ambulating Pt Will Perform Toileting - Clothing Manipulation and hygiene: with modified independence;sit to/from stand Additional ADL Goal #1: Pt/family will verbalize 3 strategies to reduce risk of falls  Plan Discharge plan remains appropriate    Co-evaluation    PT/OT/SLP Co-Evaluation/Treatment: Yes Reason for Co-Treatment: Complexity of the patient's impairments (multi-system involvement);For patient/therapist safety;To address functional/ADL transfers PT goals addressed during session: Mobility/safety with mobility OT goals addressed during session: ADL's and self-care      AM-PAC OT "6 Clicks" Daily Activity     Outcome Measure   Help from another person eating meals?: A Little Help from another person taking care of personal grooming?: A Little Help from another person toileting, which includes using toliet, bedpan, or urinal?: A Little Help from another person bathing (including washing, rinsing, drying)?: A Lot Help from another person to put on and taking off regular upper body clothing?: A Lot Help from another person to put on and taking off regular lower body clothing?: A Lot 6 Click Score: 15    End of Session Equipment Utilized During Treatment: Other (comment)(3N1)  OT Visit Diagnosis: Other abnormalities of gait and mobility (R26.89);Muscle weakness (generalized) (M62.81);Other symptoms and signs involving cognitive function;Dizziness and giddiness (R42);Hemiplegia and hemiparesis Hemiplegia - Right/Left: Left Hemiplegia - dominant/non-dominant: Non-Dominant Hemiplegia -  caused by: Cerebral infarction   Activity Tolerance Patient tolerated treatment well   Patient Left in chair;with call bell/phone within reach   Nurse Communication Mobility status;Other (comment)(wanting medication for nausea)        Time: 9604-54091234-1246 OT Time Calculation (min): 12 min  Charges: OT General Charges $OT Visit: 1 Visit OT Treatments $Self Care/Home Management : 8-22 mins  Leathie Weich MSOT, OTR/L Acute Rehab Pager: [phone] Office: [phone]   Theodoro GristCharis M Stormy Connon 08/16/2018, 2:00 PM

## 2018-08-16 NOTE — Progress Notes (Signed)
  Speech Language Pathology Treatment: Dysphagia  Patient Details Name: Mark Robles MRN: [id number] DOB: 1950-12-09 Today's Date: 08/16/2018 Time: 3338-3291 SLP Time Calculation (min) (ACUTE ONLY): 21 min  Assessment / Plan / Recommendation Clinical Impression  Pt was seen during breakfast meal with stratus interpreter used throughout session. He consumed thin liquids with meal with no overt signs of aspiration even when allowed to self-regulate his pacing, which he does impulsively. He also spontaneously consumed >3 ounces of liquid consecutively without cough elicited. He abruptly stopped his intake due to dizziness, wanting to lay down, with SLP checking to ensure clear oral cavity before reclining his chair to alleviate dizziness. Will upgrade pt's liquids to thin. Would recommend that he return to baseline diet upon return home with family present to assist.   HPI HPI: 68 y/o male admitted with acute respiratory failure with hypoxemia due to COVID 19.  Intubated on 4/23-4/26. Pt has a history of CVA, wife reprots acute dysphagia only immediately after CVA, resolved and pt was able to eat and drink regular textures. He has some trouble with his teeth so he avoid tough meats. He eats very quickly and cannot look at his food while he is eating because he gets dizzy when he looks down. Persistent cognitve impairment includes decreased awareness, short term memory and depression per family.       SLP Plan  Continue with current plan of care       Recommendations  Diet recommendations: Dysphagia 2 (fine chop);Thin liquid Liquids provided via: Cup;Straw Medication Administration: Whole meds with puree Supervision: Staff to assist with self feeding;Patient able to self feed Compensations: Slow rate;Small sips/bites;Minimize environmental distractions Postural Changes and/or Swallow Maneuvers: Seated upright 90 degrees                Oral Care Recommendations: Oral care  BID Follow up Recommendations: 24 hour supervision/assistance SLP Visit Diagnosis: Dysphagia, oropharyngeal phase (R13.12) Plan: Continue with current plan of care       GO                Virl Axe Resha Filippone 08/16/2018, 10:38 AM  Ivar Drape, M.A. CCC-SLP Acute Herbalist [phone] Office [phone]

## 2018-08-16 NOTE — Progress Notes (Signed)
Physical Therapy Treatment Patient Details Name: Mark CraftGuadalupe Rodriguez Alcantara MRN: [id number] DOB: 04/22/1950 Today's Date: 08/16/2018    History of Present Illness 68yo Spanish-speaking male w/ a hx of HTN, CVA (2011 > L sided weakness & poor short term memory), and HLD who presented w/ 7 days of fever/chills, sweats, and atypical chest pain and was found to have COVID-19. He was intubated for resp failure. He had been seen in an UC on 4/15, and was sent home w/ Levaquin and a diagnosis of PNA. His wife works at [employer] (chicken farm) in [place], and had sx concerning for COVID prior to his illness. Intubated 4/23/extubated 4/26.     PT Comments    The patient initially did not want to participate. Patient  Did mobilize and ambulate x 100' with SPC and min assist. Plans is for patient to Dc today.    Follow Up Recommendations  No PT follow up     Equipment Recommendations  None recommended by PT    Recommendations for Other Services       Precautions / Restrictions Precautions Precautions: Fall Precaution Comments: L spastic hemiparesis    Mobility  Bed Mobility Overal bed mobility: Needs Assistance Bed Mobility: Supine to Sit     Supine to sit: Supervision     General bed mobility comments: extra time to move to the left.   Transfers Overall transfer level: Needs assistance Equipment used: Straight cane Transfers: Sit to/from Stand Sit to Stand: Min guard         General transfer comment: Min Guard A for safety.   Ambulation/Gait Ambulation/Gait assistance: Min assist Gait Distance (Feet): 100 Feet Assistive device: Straight cane Gait Pattern/deviations: Step-to pattern Gait velocity: decr   General Gait Details: noted  decreased control of LLE, steady assist for turning   Stairs             Wheelchair Mobility    Modified Rankin (Stroke Patients Only)       Balance Overall balance assessment: Needs  assistance Sitting-balance support: No upper extremity supported;Feet supported Sitting balance-Leahy Scale: Fair     Standing balance support: Single extremity supported;During functional activity Standing balance-Leahy Scale: Poor Standing balance comment: dependent on external support                            Cognition Arousal/Alertness: Awake/alert                                     General Comments: Pt continues to present with decreased awareness of deficits, conversed well with interpreter on [employer].      Exercises      General Comments        Pertinent Vitals/Pain Faces Pain Scale: No hurt    Home Living                      Prior Function            PT Goals (current goals can now be found in the care plan section) Progress towards PT goals: Progressing toward goals    Frequency    Min 4X/week      PT Plan Current plan remains appropriate    Co-evaluation PT/OT/SLP Co-Evaluation/Treatment: Yes Reason for Co-Treatment: Complexity of the patient's impairments (multi-system involvement) PT goals addressed during session: Mobility/safety with mobility OT goals  addressed during session: ADL's and self-care      AM-PAC PT "6 Clicks" Mobility   Outcome Measure  Help needed turning from your back to your side while in a flat bed without using bedrails?: A Little Help needed moving from lying on your back to sitting on the side of a flat bed without using bedrails?: A Little Help needed moving to and from a bed to a chair (including a wheelchair)?: A Lot Help needed standing up from a chair using your arms (e.g., wheelchair or bedside chair)?: A Lot Help needed to walk in hospital room?: A Lot Help needed climbing 3-5 steps with a railing? : Total 6 Click Score: 13    End of Session Equipment Utilized During Treatment: Gait belt Activity Tolerance: Patient tolerated treatment well Patient left: in chair;with  call bell/phone within reach;with chair alarm set         Time: 3785-8850 PT Time Calculation (min) (ACUTE ONLY): 43 min  Charges:  $Gait Training: 23-37 mins                     Blanchard Kelch PT Acute Rehabilitation Services Pager [phone] Office [phone]    Rada Hay 08/16/2018, 5:31 PM

## 2018-08-16 NOTE — Plan of Care (Signed)
  Problem: Health Behavior/Discharge Planning: Goal: Ability to manage health-related needs will improve Outcome: Progressing   

## 2018-08-16 NOTE — Discharge Instructions (Signed)
Plan de alimentacin para la disfagia, alimentos molidos y hmedos Dysphagia Eating Plan, Minced and Moist Foods Este plan de alimentacin es para personas con problemas moderados para tragar que estn haciendo la transicin de alimentos en forma de pur a alimentos slidos. Los alimentos hmedos y molidos son blandos y estn cortados en trozos muy pequeos para que sean fciles de tragar. En este plan de alimentacin, es posible que le indiquen que beba lquidos espesos. Junto con su mdico y Pensions consultant en alimentacin y nutricin (nutricionista), asegrense de que usted tenga una dieta segura y [place] todos los nutrientes que necesita. Consejos para seguir este plan [place] generales para los alimentos   Usted puede comer alimentos que sean blandos y hmedos.  Siempre pruebe la textura del alimento antes de tomar un bocado. Pinche el alimento con un tenedor o una cuchara para asegurarse de que est tierno.  Ingiera bocados pequeos. Cada bocado debe ser ms pequeo que la ua del dedo meique (alrededor de 4mm por 4mm).  Si estaba siguiendo un plan de alimentacin que consista en ingerir alimentos en forma pur, puede seguir comiendo cualquiera de los alimentos de esa dieta.  Evite los alimentos que sean secos, duros, pegajosos, gomosos, speros o crocantes.  Evite los alimentos que se separan en lquidos diluidos y slidos, tales como cereales con [place] o sopas con trozos.  Evite los lquidos que tienen semillas o trozos.  Si el mdico se lo indica, espese los lquidos. Siga las instrucciones del mdico sobre los productos que debe usar, cmo hacerlo y cun espesos deben ser. ? Puede usar un espesante comercial, cereal de arroz y copos de papa. ? Los lquidos espesados son generalmente de consistencia similar a la de un pudin o pueden ser tan espesos como la miel o lo suficientemente espesos como para ser ingeridos con Media planner. Coccin  Es posible que necesite una procesadora,  una batidora o un pasapur para ablandar algunos alimentos.  Para humedecer los alimentos, puede agregar lquidos al momento de Insurance underwriter, Radio producer pur o moler sus alimentos para lograr la consistencia adecuada. Estos lquidos incluyen salsas, jugos de frutas o [address], [place], [address] y [place], [place].  Recaliente los alimentos lentamente para evitar que se forme una costra dura. Planificacin de las comidas  Coma diferentes alimentos para obtener todos los nutrientes que necesita.  Siga el plan alimentario como se lo haya indicado su mdico o nutricionista. Qu alimentos estn permitidos? Cereales Panes blandos sin nueces ni semillas embebidos en lquidos. Panqueques, panecillos dulces, pasteles daneses y tostadas francesas humedecidos con jarabe o salsa. Pasta, fideos, arroz bien cocidos y aderezo de pan en trozos muy pequeos y con salsa espesa. Dumplings o spaetzle blandos en trozos muy pequeos y [place] o salsa. Cereales cocidos en preparaciones blandas. Verduras Verduras muy tiernas y bien cocidas en trozos muy pequeos. Papa blanda cocida, en forma de pur. Jugo de vegetales espesado. Frutas Frutas cocidas o enlatadas que estn blandas o hmedas y no tengan piel ni semillas. Bananas blandas y frescas. Jugos de frutas espesados. Carne y otros alimentos proteicos [address] de res o pollo tierna, hmeda y [place] o picada finamente. Albndigas o pan de carne jugosos. Pescado sin espinas. Huevos revueltos, poch o apenas duros. Tofu. [place] y alternativas a la carne en trozos muy pequeos. Frijoles, guisantes bien cocidos, humedecidos y en forma de pur, frijoles en salsa de [place], y otras legumbres. Lcteos Leche espesada. Queso crema. Yogur. [employer]. [employer]. Grasas y [employer]. Margarina. Crema para  cereales, segn las recomendaciones en la consistencia de los lquidos. Salsas. Salsas a base de crema. Mayonesa. Dulces y postres Pudin. Natillas. Helado y sorbete.  Coberturas batidas. Tortas blandas y hmedas. Glaseado. Gelatina. Jaleas y conservas sin semillas. Condimentos y otros alimentos Salsas que tengan trozos blandos de tamao inferior a 50mm. Condimentos para ensalada. Guisos con trozos pequeos de carne tierna. Todos los condimentos y Charity fundraiser. Bebidas Cualquier bebida espesa segn la recomendacin del nutricionista. Qu alimentos no estn permitidos? Cereales Los panes que sean duros o que tengan frutos secos o semillas. Bizcochos, panqueques, wafles y aderezo de pan duros. Cereales de grano grueso. Cereales que tienen frutos secos, semillas, frutas secas o coco. Arroz pegajoso. Trozos grandes de pasta. Verduras Todas las verduras crudas. Verduras duras, fibrosas o pegajosas cocidas, como [address], los guisantes, el brcoli, el repollo, los repollitos de [place] y los Paediatric nurse. Cscara de papas. Papas y otras verduras fritas. Papas fritas. Maz y guisantes cocidos. Frutas Frutas crudas duras, crocantes, fibrosas, con mucha pulpa y Waves, [place], pia, papaya y sanda. Frutas con cscaras duras y semillas, como las uvas. Nils Pyle deshidratadas y frutas desecadas. Carnes y otros alimentos ricos en protenas Trozos grandes de carne. Carnes secas y duras, como tocino, salchichas y perros calientes. Pollo, pavo o pescado con piel y huesos o espinas. Mantequilla de man crocante. Frutos secos. Semillas. Lcteos Yogur con frutos secos, semillas o grandes trozos. Trozos grandes de [place]. Postres congelados y consistencia de [employer] no permitidos por su nutricionista. Dulces y [employer] speros, duros, gomosos o pegajosos. Cualquier postre que tenga frutos secos, semillas, coco, pia o frutas pasas. Budn de pan. Condimentos y otros alimentos [place] y guisos con grandes trozos. Sndwiches. Pizza. Resumen  Los alimentos hmedos y molidos pueden ser tiles para las personas con problemas moderados para tragar.  En el plan de  alimentacin para disfagia, puede comer alimentos blandos, hmedos y cortados en trozos de tamao inferior a 65mm por 75mm.  Es posible que le indiquen que [employer] lquidos. Siga las indicaciones del mdico con respecto a cmo Media planner y con qu consistencia. Esta informacin no tiene Theme park manager el consejo del mdico. Asegrese de hacerle al mdico cualquier pregunta que tenga. Document Released: 12/06/2012 Document Revised: 10/07/2016 Document Reviewed: 10/07/2016 Elsevier Interactive Patient Education  2019 Elsevier Inc.        Person Under Monitoring Name: Mariel Craft  Location: [address] [place] [address]   Infection Prevention Recommendations for Individuals Confirmed to have, or Being Evaluated for, 2019 Novel Coronavirus (COVID-19) Infection Who Receive Care at Home  Individuals who are confirmed to have, or are being evaluated for, COVID-19 should follow the prevention steps below until a healthcare provider or local or state health department says they can return to normal activities.  Stay home except to get medical care You should restrict activities outside your home, except for getting medical care. Do not go to work, school, or public areas, and do not use public transportation or taxis.  Call ahead before visiting your doctor Before your medical appointment, call the healthcare provider and tell them that you have, or are being evaluated for, COVID-19 infection. This will help the healthcare providers office take steps to keep other people from getting infected. Ask your healthcare provider to call the local or state health department.  Monitor your symptoms Seek prompt medical attention if your illness is worsening (e.g., difficulty breathing). Before going to your medical appointment, call the healthcare  provider and tell them that you have, or are being evaluated for, COVID-19 infection. Ask your healthcare provider to  call the local or state health department.  Wear a facemask You should wear a facemask that covers your nose and mouth when you are in the same room with other people and when you visit a healthcare provider. People who live with or visit you should also wear a facemask while they are in the same room with you.  Separate yourself from other people in your home As much as possible, you should stay in a different room from other people in your home. Also, you should use a separate bathroom, if available.  Avoid sharing household items You should not share dishes, drinking glasses, cups, eating utensils, towels, bedding, or other items with other people in your home. After using these items, you should wash them thoroughly with soap and water.  Cover your coughs and sneezes Cover your mouth and nose with a tissue when you cough or sneeze, or you can cough or sneeze into your sleeve. Throw used tissues in a lined trash can, and immediately wash your hands with soap and water for at least 20 seconds or use an alcohol-based hand rub.  Wash your [employer] your hands often and thoroughly with soap and water for at least 20 seconds. You can use an alcohol-based hand sanitizer if soap and water are not available and if your hands are not visibly dirty. Avoid touching your eyes, nose, and mouth with unwashed hands.   Prevention Steps for Caregivers and Household Members of Individuals Confirmed to have, or Being Evaluated for, COVID-19 Infection Being Cared for in the Home  If you live with, or provide care at home for, a person confirmed to have, or being evaluated for, COVID-19 infection please follow these guidelines to prevent infection:  Follow healthcare providers instructions Make sure that you understand and can help the patient follow any healthcare provider instructions for all care.  Provide for the patients basic needs You should help the patient with basic needs in the home  and provide support for getting groceries, prescriptions, and other personal needs.  Monitor the patients symptoms If they are getting sicker, call his or her medical provider and tell them that the patient has, or is being evaluated for, COVID-19 infection. This will help the healthcare providers office take steps to keep other people from getting infected. Ask the healthcare provider to call the local or state health department.  Limit the number of people who have contact with the patient  If possible, have only one caregiver for the patient.  Other household members should stay in another home or place of residence. If this is not possible, they should stay  in another room, or be separated from the patient as much as possible. Use a separate bathroom, if available.  Restrict visitors who do not have an essential need to be in the home.  Keep older adults, very young children, and other sick people away from the patient Keep older adults, very young children, and those who have compromised immune systems or chronic health conditions away from the patient. This includes people with chronic heart, lung, or kidney conditions, diabetes, and cancer.  Ensure good ventilation Make sure that shared spaces in the home have good air flow, such as from an air conditioner or an opened window, weather permitting.  Wash your hands often  Wash your hands often and thoroughly with soap and water for at  least 20 seconds. You can use an alcohol based hand sanitizer if soap and water are not available and if your hands are not visibly dirty.  Avoid touching your eyes, nose, and mouth with unwashed hands.  Use disposable paper towels to dry your hands. If not available, use dedicated cloth towels and replace them when they become wet.  Wear a facemask and gloves  Wear a disposable facemask at all times in the room and gloves when you touch or have contact with the patients blood, body fluids,  and/or secretions or excretions, such as sweat, saliva, sputum, nasal mucus, vomit, urine, or feces.  Ensure the mask fits over your nose and mouth tightly, and do not touch it during use.  Throw out disposable facemasks and gloves after using them. Do not reuse.  Wash your hands immediately after removing your facemask and gloves.  If your personal clothing becomes contaminated, carefully remove clothing and launder. Wash your hands after handling contaminated clothing.  Place all used disposable facemasks, gloves, and other waste in a lined container before disposing them with other household waste.  Remove gloves and wash your hands immediately after handling these items.  Do not share dishes, glasses, or other household items with the patient  Avoid sharing household items. You should not share dishes, drinking glasses, cups, eating utensils, towels, bedding, or other items with a patient who is confirmed to have, or being evaluated for, COVID-19 infection.  After the person uses these items, you should wash them thoroughly with soap and water.  Wash laundry thoroughly  Immediately remove and wash clothes or bedding that have blood, body fluids, and/or secretions or excretions, such as sweat, saliva, sputum, nasal mucus, vomit, urine, or feces, on them.  Wear gloves when handling laundry from the patient.  Read and follow directions on labels of laundry or clothing items and detergent. In general, wash and dry with the warmest temperatures recommended on the label.  Clean all areas the individual has used often  Clean all touchable surfaces, such as counters, tabletops, doorknobs, bathroom fixtures, toilets, phones, keyboards, tablets, and bedside tables, every day. Also, clean any surfaces that may have blood, body fluids, and/or secretions or excretions on them.  Wear gloves when cleaning surfaces the patient has come in contact with.  Use a diluted bleach solution (e.g., dilute  bleach with 1 part bleach and 10 parts water) or a household disinfectant with a label that says EPA-registered for coronaviruses. To make a bleach solution at home, add 1 tablespoon of bleach to 1 quart (4 cups) of water. For a larger supply, add  cup of bleach to 1 gallon (16 cups) of water.  Read labels of cleaning products and follow recommendations provided on product labels. Labels contain instructions for safe and effective use of the cleaning product including precautions you should take when applying the product, such as wearing gloves or eye protection and making sure you have good ventilation during use of the product.  Remove gloves and wash hands immediately after cleaning.  Monitor yourself for signs and symptoms of illness Caregivers and household members are considered close contacts, should monitor their health, and will be asked to limit movement outside of the home to the extent possible. Follow the monitoring steps for close contacts listed on the symptom monitoring form.   ? If you have additional questions, contact your local health department or call the epidemiologist on call at [phone] (available 24/7). ? This guidance is subject to change. For  the most up-to-date guidance from [facility], please refer to their website: TripMetro.huhttps://www.cdc.gov/coronavirus/2019-ncov/hcp/guidance-prevent-spread.html

## 2018-08-16 NOTE — Progress Notes (Signed)
Utilized interpreter case number [phone] to do assessment and administer meds.

## 2018-08-16 NOTE — Discharge Summary (Signed)
[facility]  Physician Discharge Summary   Patient ID: Mark Robles MRN: [id number] DOB/AGE: [birth year] 68 y.o.  Admit date: 08/09/2018 Discharge date: 08/16/2018  PCP: Mike Gip, FNP  DISCHARGE DIAGNOSES:  Respiratory failure with hypoxia, resolved Pneumonia secondary to COVID-19 History of stroke History of bulimia   RECOMMENDATIONS FOR OUTPATIENT FOLLOW UP: 1. Home health has been ordered. 2. Patient being discharged on a dysphagia 2 diet.  Will need swallow eval in the outpatient setting versus home health    Home Health: Home health PT OT speech Equipment/Devices: Bedside commode  CODE STATUS: Full code  DISCHARGE CONDITION: fair  Diet recommendation: Dysphagia 2 diet  INITIAL HISTORY: 68yo Spanish-speaking male w/ a hx of HTN, CVA (2011 > L sided weakness & poor short term memory), and HLD who presented w/ 7 days of fever/chills, sweats, and atypical chest pain and was found to have COVID-19. He was intubated for resp failure. He had been seen in an UC on 4/15, and was sent home w/ Levaquin and a diagnosis of PNA. His wife works at [employer] (ch[employer]) in [place], and had sx concerning for COVID prior to his illness.   Consultations:  PCCM   HOSPITAL COURSE:   Acute Hypoxic Respiratory failure - COVID 19 - ARDS Patient presented with respiratory failure.  He had to be intubated and was on mechanical ventilation.  However he had rapid improvement.  He was extubated on April 26.  He was transferred to the floor.  He was taken off of oxygen.  He is saturating normal on room air.  He was seen by physical and Occupational Therapy.  He will be sent home with home health.  He was also seen by speech therapy and they are recommending a dysphagia 2 diet.  Home health speech therapy will also be ordered.  Discussed with his daughter.  They will be able to care for the patient at home.    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   Chronically on Plavix and statin.  Noted to have left-sided deficits.    History of DVT Apparently treated with Eliquis for about 2 years.  Follow-up Doppler study in July 2019 was negative for DVT.  Was not on anticoagulation at the time of admission.  He was given prophylactic doses of Lovenox in the hospital.  Hyperlipidemia Continue home medications  Shock, likely septic Resolved   Chronic sinus brady Has been previously evaluated by [facility] w/ holter monitor   Overall stable.  Okay for discharge home today with home health.   PERTINENT LABS:  The results of significant diagnostics from this hospitalization (including imaging, microbiology, ancillary and laboratory) are listed below for reference.    Microbiology: Recent Results (from the past 240 hour(s))  Culture, blood (routine x 2)     Status: None   Collection Time: 08/09/18  4:00 PM  Result Value Ref Range Status   Specimen Description BLOOD SITE NOT SPECIFIED  Final   Special Requests   Final    BOTTLES DRAWN AEROBIC AND ANAEROBIC Blood Culture adequate volume   Culture   Final    NO GROWTH 5 DAYS Performed at [facility], [address] [place], [place] [address]    Report Status 08/14/2018 FINAL  Final  SARS Coronavirus 2 [facility] order, Performed in [facility] hospital lab)     Status: Abnormal   Collection Time: 08/09/18  4:11 PM  Result Value Ref Range Status   SARS Coronavirus  2 POSITIVE (A) NEGATIVE Final    Comment: RESULT CALLED TO, READ BACK BY AND VERIFIED WITH: Particia Nearing RN 1610 08/09/18 A BROWNING (NOTE) If result is NEGATIVE SARS-CoV-2 target nucleic acids are NOT DETECTED. The SARS-CoV-2 RNA is generally detectable in upper and lower  respiratory specimens during the acute phase of infection. The lowest  concentration of SARS-CoV-2 viral copies this assay can detect is 250  copies / mL. A negative result does not preclude SARS-CoV-2 infection  and should not be used as  the sole basis for treatment or other  patient management decisions.  A negative result may occur with  improper specimen collection / handling, submission of specimen other  than nasopharyngeal swab, presence of viral mutation(s) within the  areas targeted by this assay, and inadequate number of viral copies  (<250 copies / mL). A negative result must be combined with clinical  observations, patient history, and epidemiological information. If result is POSITIVE SARS-CoV-2 target nucleic acids are DETECTED.  The SARS-CoV-2 RNA is generally detectable in upper and lower  respiratory specimens during the acute phase of infection.  Positive  results are indicative of active infection with SARS-CoV-2.  Clinical  correlation with patient history and other diagnostic information is  necessary to determine patient infection status.  Positive results do  not rule out bacterial infection or co-infection with other viruses. If result is PRESUMPTIVE POSTIVE SARS-CoV-2 nucleic acids MAY BE PRESENT.   A presumptive positive result was obtained on the submitted specimen  and confirmed on repeat testing.  While 2019 novel coronavirus  (SARS-CoV-2) nucleic acids may be present in the submitted sample  additional confirmatory testing may be necessary for epidemiological  and / or clinical management purposes  to differentiate between  SARS-CoV-2 and other Sarbecovirus currently known to infect humans.  If clinically indicated additional testing with an alternate test  methodology [phone])  is advised. The SARS-CoV-2 RNA is generally  detectable in upper and lower respiratory specimens during the acute  phase of infection. The expected result is Negative. Fact Sheet for Patients:  BoilerBrush.com.cy Fact Sheet for Healthcare Providers: https://pope.com/ This test is not yet approved or cleared by the [place] FDA and has been authorized for  detection and/or diagnosis of SARS-CoV-2 by FDA under an Emergency Use Authorization (EUA).  This EUA will remain in effect (meaning this test can be used) for the duration of the COVID-19 declaration under Section 564(b)(1) of the Act, 21 U.S.C. section 360bbb-3(b)(1), unless the authorization is terminated or revoked sooner. Performed at [facility], [address], [place], [place] [address]   Culture, blood (routine x 2)     Status: None   Collection Time: 08/09/18  8:30 PM  Result Value Ref Range Status   Specimen Description BLOOD RIGHT ARM  Final   Special Requests   Final    BOTTLES DRAWN AEROBIC ONLY Blood Culture results may not be optimal due to an excessive volume of blood received in culture bottles   Culture   Final    NO GROWTH 5 DAYS Performed at [facility], [address] [place], [place] [address]    Report Status 08/14/2018 FINAL  Final  MRSA PCR Screening     Status: None   Collection Time: 08/10/18 12:03 PM  Result Value Ref Range Status   MRSA by PCR NEGATIVE NEGATIVE Final    Comment:        The GeneXpert MRSA Assay (FDA approved for NASAL specimens  only), is one component of a comprehensive MRSA colonization surveillance program. It is not intended to diagnose MRSA infection nor to guide or monitor treatment for MRSA infections. Performed at [facility], [address] [place], [place] [address]   Culture, respiratory (non-expectorated)     Status: None   Collection Time: 08/10/18  2:45 PM  Result Value Ref Range Status   Specimen Description TRACHEAL ASPIRATE  Final   Special Requests NONE  Final   Gram Stain   Final    NO SQUAMOUS EPITHELIAL CELLS PRESENT FEW WBC PRESENT,BOTH PMN AND MONONUCLEAR FEW GRAM POSITIVE COCCI IN PAIRS IN CLUSTERS RARE GRAM NEGATIVE RODS FEW GRAM NEGATIVE COCCOBACILLI    Culture   Final    Consistent with normal respiratory flora. Performed at [facility], [address],  [place], [place] [address]    Report Status 08/12/2018 FINAL  Final     Labs: Basic Metabolic Panel: Recent Labs  Lab 08/10/18 0125  08/11/18 0247  08/12/18 0150 08/12/18 0357 08/13/18 0215 08/14/18 0510 08/16/18 0520  NA 131*   < > 135   < > 138 139 139 139 133*  K 4.7   < > 5.1   < > 5.0 4.8 4.7 4.1 4.3  CL 100  --  103  --  109  --  105 103 100  CO2 22  --  20*  --  23  --  28 27 26   GLUCOSE 122*  --  181*  --  236*  --  150* 93 93  BUN 12  --  19  --  25*  --  26* 26* 18  CREATININE 0.75  --  0.85  --  0.73  --  0.60* 0.75 0.84  CALCIUM 8.2*  --  8.3*  --  8.0*  --  8.3* 8.4* 8.5*  MG 2.4  --  2.7*  --  2.6*  --   --  2.1  --   PHOS 3.3  --  4.3  --  2.5  --   --   --   --    < > = values in this interval not displayed.   Liver Function Tests: Recent Labs  Lab 08/09/18 1604 08/10/18 0125 08/11/18 0247 08/12/18 0150 08/16/18 0520  AST 180* 152* 86* 89* 78*  ALT 191* 166* 123* 116* 107*  ALKPHOS 99 91 83 93 64  BILITOT 0.7 0.9 0.5 0.3 0.5  PROT 6.4* 6.3* 6.3* 6.0* 6.5  ALBUMIN 2.3* 2.2* 2.1* 2.4* 3.3*   CBC: Recent Labs  Lab 08/09/18 1604 08/09/18 2023 08/10/18 0125  08/11/18 0247  08/12/18 0150 08/12/18 0357 08/13/18 0215 08/14/18 0510 08/16/18 0520  WBC 8.8 6.3 6.5  --  6.3  --  12.2*  --  10.3 7.9 5.6  NEUTROABS 7.2 5.0 5.4  --  6.1  --   --   --   --   --   --   HGB 7.9* 13.7  13.6 13.6   < > 13.3   < > 12.7* 12.2* 12.5* 13.1 14.3  HCT 23.4* 39.6 39.1   < > 39.6   < > 38.7* 36.0* 37.5* 40.6 43.8  MCV 95.1 90.6 88.9  --  92.3  --  96.3  --  95.4 94.0 92.6  PLT 224 168 181  --  246  --  300  --  249 280 359   < > = values in this interval not displayed.  Cardiac Enzymes: Recent Labs  Lab 08/09/18 2023 08/10/18 0125 08/11/18 0247 08/12/18 0150  CKTOTAL  --  48* 47* 27*  TROPONINI <0.03  --   --   --    BNP: BNP (last 3 results) Recent Labs    08/09/18 2024  BNP 13.4    CBG: Recent Labs  Lab 08/13/18 2003 08/13/18 2313 08/14/18 0354  08/14/18 0735 08/16/18 0822  GLUCAP 143* 101* 93 99 79     IMAGING STUDIES Dg Chest 2 View  Result Date: 08/02/2018 CLINICAL DATA:  Cough and hypoxia EXAM: CHEST - 2 VIEW COMPARISON:  None. FINDINGS: Bilateral mid lung predominant airspace opacities, right greater than left. No pleural effusion or pneumothorax. Normal cardiomediastinal contours. IMPRESSION: Bilateral mid lung predominant airspace opacities, right greater than left, concerning for multifocal infection. Electronically Signed   By: Deatra Robinson M.D.   On: 08/02/2018 14:09   Dg Abd 1 View  Result Date: 08/10/2018 CLINICAL DATA:  Orogastric tube placement. EXAM: ABDOMEN - 1 VIEW COMPARISON:  None. FINDINGS: The bowel gas pattern is normal. Distal tip of orogastric tube is seen in expected position of distal stomach. No radio-opaque calculi or other significant radiographic abnormality are seen. IMPRESSION: Distal tip of orogastric tube is seen in expected position of distal stomach. No evidence of bowel obstruction or ileus. Electronically Signed   By: Lupita Raider M.D.   On: 08/10/2018 12:55   Portable Chest X-ray  Result Date: 08/10/2018 CLINICAL DATA:  Endotracheal tube and central line placement. COVID-19 positive. EXAM: PORTABLE CHEST 1 VIEW COMPARISON:  08/09/2018 FINDINGS: An endotracheal tube has been placed and terminates 5 cm above the carina. A new left jugular catheter terminates over the lower SVC. The cardiomediastinal silhouette is unchanged with normal heart size. Patchy bilateral airspace consolidation, greatest in the lower lungs, has not significantly changed. No sizable pleural effusion or pneumothorax is identified. IMPRESSION: 1. Interval endotracheal tube and central line placement as above. 2. Unchanged bilateral pneumonia. Electronically Signed   By: Sebastian Ache M.D.   On: 08/10/2018 12:57   Dg Chest Port 1 View  Result Date: 08/09/2018 CLINICAL DATA:  Pneumonia EXAM: PORTABLE CHEST 1 VIEW COMPARISON:   08/01/2018 FINDINGS: Worsening right lower lobe airspace disease and left lower lobe airspace disease. Patchy areas of right upper lobe airspace disease. No pleural effusion or pneumothorax. Stable cardiomediastinal silhouette. No acute osseous abnormality. IMPRESSION: 1. Worsening bilateral lower lobe pneumonia. Patchy areas of right upper lobe airspace disease concerning for pneumonia. Electronically Signed   By: Elige Ko   On: 08/09/2018 16:24    DISCHARGE EXAMINATION: Vitals:   08/16/18 0416 08/16/18 0500 08/16/18 0600 08/16/18 0700  BP:  111/70 107/73 112/77  Pulse:  67 63 (!) 57  Resp:  15 (!) 24 20  Temp: 98.4 F (36.9 C)     TempSrc: Oral     SpO2:  93% 92% 95%  Weight:      Height:       General appearance: alert, cooperative, appears stated age and no distress Resp: clear to auscultation bilaterally Cardio: regular rate and rhythm, S1, S2 normal, no murmur, click, rub or gallop GI: soft, non-tender; bowel sounds normal; no masses,  no organomegaly  DISPOSITION: Home with home health  Discharge Instructions    Call MD for:  difficulty breathing, headache or visual disturbances   Complete by:  As directed    Call MD for:  extreme fatigue   Complete by:  As directed  Call MD for:  persistant dizziness or light-headedness   Complete by:  As directed    Call MD for:  persistant nausea and vomiting   Complete by:  As directed    Call MD for:  severe uncontrolled pain   Complete by:  As directed    Call MD for:  temperature >100.4   Complete by:  As directed    Discharge instructions   Complete by:  As directed    DIET: DYSPHAGIA 2  COVID 19 INSTRUCTIONS: You are being discharged from the hospital after treatment for covid-19 infection. You are being discharged based on the standards of national guidelines (body temperature remaining normal for 3 days, symptoms significantly improved; obvious absorption of inflammation on lung images). - You are felt to be stable  enough to no longer require inpatient monitoring, testing, and treatment, though you will need to follow the recommendations below: - Based on the CDC's non-test criteria for ending self-isolation: You may not return to work/leave the home until at least 7 days since symptom onset AND 3 days without a fever (without taking tylenol, ibuprofen, etc.) AND have improvement in respiratory symptoms. - Do not take NSAID medications (including, but not limited to, ibuprofen, advil, motrin, naproxen, aleve, goody's powder, etc.) - Follow up with your doctor in the next week via telehealth or seek medical attention right away if your symptoms get WORSE.  - Consider donating plasma after you have recovered (either 14 days after a negative test or 28 days after symptoms have completely resolved) because your antibodies to this virus may be helpful to give to others with life-threatening infections. Please go to the website www.[email or web address] if you would like to consider volunteering for plasma donation.    Directions for you at home:  Wear a facemask You should wear a facemask that covers your nose and mouth when you are in the same room with other people and when you visit a healthcare provider. People who live with or visit you should also wear a facemask while they are in the same room with you.  Separate yourself from other people in your home As much as possible, you should stay in a different room from other people in your home. Also, you should use a separate bathroom, if available.  Avoid sharing household items You should not share dishes, drinking glasses, cups, eating utensils, towels, bedding, or other items with other people in your home. After using these items, you should wash them thoroughly with soap and water.  Cover your coughs and sneezes Cover your mouth and nose with a tissue when you cough or sneeze, or you can cough or sneeze into your sleeve. Throw used tissues in a lined trash  can, and immediately wash your hands with soap and water for at least 20 seconds or use an alcohol-based hand rub.  Wash your [employer] your hands often and thoroughly with soap and water for at least 20 seconds. You can use an alcohol-based hand sanitizer if soap and water are not available and if your hands are not visibly dirty. Avoid touching your eyes, nose, and mouth with unwashed hands.  Directions for those who live with, or provide care at home for you:  Limit the number of people who have contact with the patient If possible, have only one caregiver for the patient. Other household members should stay in another home or place of residence. If this is not possible, they should stay in another room, or be separated  from the patient as much as possible. Use a separate bathroom, if available. Restrict visitors who do not have an essential need to be in the home.  Ensure good ventilation Make sure that shared spaces in the home have good air flow, such as from an air conditioner or an opened window, weather permitting.  Wash your hands often Wash your hands often and thoroughly with soap and water for at least 20 seconds. You can use an alcohol based hand sanitizer if soap and water are not available and if your hands are not visibly dirty. Avoid touching your eyes, nose, and mouth with unwashed hands. Use disposable paper towels to dry your hands. If not available, use dedicated cloth towels and replace them when they become wet.  Wear a facemask and gloves Wear a disposable facemask at all times in the room and gloves when you touch or have contact with the patient's blood, body fluids, and/or secretions or excretions, such as sweat, saliva, sputum, nasal mucus, vomit, urine, or feces.  Ensure the mask fits over your nose and mouth tightly, and do not touch it during use. Throw out disposable facemasks and gloves after using them. Do not reuse. Wash your hands immediately after  removing your facemask and gloves. If your personal clothing becomes contaminated, carefully remove clothing and launder. Wash your hands after handling contaminated clothing. Place all used disposable facemasks, gloves, and other waste in a lined container before disposing them with other household waste. Remove gloves and wash your hands immediately after handling these items.  Do not share dishes, glasses, or other household items with the patient Avoid sharing household items. You should not share dishes, drinking glasses, cups, eating utensils, towels, bedding, or other items with a patient who is confirmed to have, or being evaluated for, COVID-19 infection. After the person uses these items, you should wash them thoroughly with soap and water.  Wash laundry thoroughly Immediately remove and wash clothes or bedding that have blood, body fluids, and/or secretions or excretions, such as sweat, saliva, sputum, nasal mucus, vomit, urine, or feces, on them. Wear gloves when handling laundry from the patient. Read and follow directions on labels of laundry or clothing items and detergent. In general, wash and dry with the warmest temperatures recommended on the label.  Clean all areas the individual has used often Clean all touchable surfaces, such as counters, tabletops, doorknobs, bathroom fixtures, toilets, phones, keyboards, tablets, and bedside tables, every day. Also, clean any surfaces that may have blood, body fluids, and/or secretions or excretions on them. Wear gloves when cleaning surfaces the patient has come in contact with. Use a diluted bleach solution (e.g., dilute bleach with 1 part bleach and 10 parts water) or a household disinfectant with a label that says EPA-registered for coronaviruses. To make a bleach solution at home, add 1 tablespoon of bleach to 1 quart (4 cups) of water. For a larger supply, add  cup of bleach to 1 gallon (16 cups) of water. Read labels of cleaning  products and follow recommendations provided on product labels. Labels contain instructions for safe and effective use of the cleaning product including precautions you should take when applying the product, such as wearing gloves or eye protection and making sure you have good ventilation during use of the product. Remove gloves and wash hands immediately after cleaning.  Monitor yourself for signs and symptoms of illness Caregivers and household members are considered close contacts, should monitor their health, and will be asked to  limit movement outside of the home to the extent possible. Follow the monitoring steps for close contacts listed on the symptom monitoring form.   If you have additional questions, contact your local health department or call the epidemiologist on call at [phone] (available 24/7). This guidance is subject to change. For the most up-to-date guidance from [facility], please refer to their website: TripMetro.hu   You were cared for by a hospitalist during your hospital stay. If you have any questions about your discharge medications or the care you received while you were in the hospital after you are discharged, you can call the unit and asked to speak with the hospitalist on call if the hospitalist that took care of you is not available. Once you are discharged, your primary care physician will handle any further medical issues. Please note that NO REFILLS for any discharge medications will be authorized once you are discharged, as it is imperative that you return to your primary care physician (or establish a relationship with a primary care physician if you do not have one) for your aftercare needs so that they can reassess your need for medications and monitor your lab values. If you do not have a primary care physician, you can call [phone] for a physician referral.   Increase activity slowly   Complete by:   As directed        Allergies as of 08/16/2018      Reactions   Amlodipine Swelling      Medication List    STOP taking these medications   losartan 50 MG tablet Commonly known as:  COZAAR     TAKE these medications   clopidogrel 75 MG tablet Commonly known as:  PLAVIX Take 1 tablet (75 mg total) by mouth daily.   famotidine 20 MG tablet Commonly known as:  Pepcid Take 1 tablet (20 mg total) by mouth 2 (two) times daily.   FLUoxetine 20 MG tablet Commonly known as:  PROZAC TAKE 1 TABLET BY MOUTH DAILY IN THE MORNING   fluticasone 50 MCG/ACT nasal spray Commonly known as:  FLONASE Place 2 sprays into both nostrils daily.   hydrOXYzine 10 MG tablet Commonly known as:  ATARAX/VISTARIL Take 1 tablet (10 mg total) by mouth 3 (three) times daily as needed.   loperamide 2 MG capsule Commonly known as:  IMODIUM TAKE 2 CAPSULES BY MOUTH INITIALLY THEN 1 CAPSULE AFTER EACH LOOSE STOOL (DO NOT EXCEED 8 CAPSULES IN 24 HOURS)   meclizine 25 MG tablet Commonly known as:  ANTIVERT TAKE 1 TABLET BY MOUTH 3 TIMES DAILY AS NEEDED FOR DIZZINESS. What changed:  See the new instructions.   Olopatadine HCl 0.2 % Soln APPLY 1 DROP TO EYE DAILY AS NEEDED (ITCHING EYES).   rosuvastatin 20 MG tablet Commonly known as:  CRESTOR Take 1 tablet (20 mg total) by mouth daily.   traZODone 50 MG tablet Commonly known as:  DESYREL Take 0.5-1 tablets (25-50 mg total) by mouth at bedtime as needed for sleep.            Durable Medical Equipment  (From admission, onward)         Start     Ordered   08/16/18 1243  For home use only DME Bedside commode  Once    Question:  Patient needs a bedside commode to treat with the following condition  Answer:  Physical deconditioning   08/16/18 1242           Follow-up Information  [facility].   Specialty:  Home Health Services Why:  Home Health Physical and Occupational Therapy and Speech Therapy Contact  information: [address] [place] [id number] [phone]        [facility] Follow up.   Why:  Bedside [facility] Contact information: [address], [place], [place]   [phone]          TOTAL DISCHARGE TIME: 35 minutes  Cynthis Purington Rito EhrlichKrishnan  [facility] Pager on [email or web address]  08/16/2018, 1:36 PM

## 2018-08-16 NOTE — Progress Notes (Signed)
Given oral and written discharge instructions in Spanish utilizing interpreter [phone]. Awaiting delivery of equipment. Daughter awaiting to pick up pt.

## 2018-08-23 ENCOUNTER — Telehealth: Payer: Self-pay | Admitting: Family Medicine

## 2018-08-23 NOTE — Telephone Encounter (Signed)
1) Medication(s) Requested (by name): rosuvastatin (CRESTOR) 20 MG tablet  meclizine (ANTIVERT) 25 MG tablet  FLUoxetine (PROZAC) 20 MG tablet   2) Pharmacy of Choice: [facility] 3) Special Requests:   Approved medications will be sent to the pharmacy, we will reach out if there is an issue.  Requests made after 3pm may not be addressed until the following business day!  If a patient is unsure of the name of the medication(s) please note and ask patient to call back when they are able to provide all info, do not send to responsible party until all information is available!

## 2018-08-24 MED FILL — LOSARTAN POTASSIUM 50 MG TA: 50 | 30 days supply | Qty: 30 | Fill #2

## 2018-08-24 MED FILL — ?ROSUVASTATIN CALCIUM 20MG: 20 | 30 days supply | Qty: 30 | Fill #5

## 2018-08-24 MED FILL — FLUTICASONE PROP 50 MCG SPR: 50 | 30 days supply | Qty: 16 | Fill #3

## 2018-08-24 MED FILL — FLUoxetine HCL 20 MG TABS: 20 | 30 days supply | Qty: 30 | Fill #2

## 2018-08-24 MED FILL — ?MECLIZINE 25MG TAB: 25 | 20 days supply | Qty: 60 | Fill #2

## 2018-08-24 NOTE — Telephone Encounter (Signed)
Pt has refills at pharmacy, they will get them ready and contact the patient.

## 2018-09-08 MED FILL — CLOPIDOGREL 75 MG TABLET: 75 | 30 days supply | Qty: 30 | Fill #1

## 2018-09-13 ENCOUNTER — Other Ambulatory Visit: Payer: Self-pay | Admitting: Family Medicine

## 2018-09-13 DIAGNOSIS — R42 Dizziness and giddiness: Secondary | ICD-10-CM

## 2018-09-13 MED FILL — ROSUVASTATIN CALCIUM 20 MG: 20 | 30 days supply | Qty: 30 | Fill #3

## 2018-09-13 MED FILL — hydrOXYzine HCL 10 MG TABS: 10 | 10 days supply | Qty: 30 | Fill #2

## 2018-09-14 ENCOUNTER — Telehealth: Payer: Self-pay | Admitting: *Deleted

## 2018-09-14 MED FILL — ?MECLIZINE 25MG TAB: 25 | 20 days supply | Qty: 60 | Fill #0

## 2018-09-14 NOTE — Telephone Encounter (Signed)
I called and his wife answered the phone.  "I'm his wife".   I let her know why I was calling regarding plasma donation from those who are recovered from COVID-19 to give to those who are recovering from the virus so they could recover better and with less complications. "I will discuss this with my husband".   I gave there the [email or web address] web site with instructions to go on there and fill out the application and someone would contact him to set up a donation time.

## 2018-09-26 ENCOUNTER — Other Ambulatory Visit: Payer: Self-pay | Admitting: Family Medicine

## 2018-09-26 DIAGNOSIS — F320 Major depressive disorder, single episode, mild: Secondary | ICD-10-CM

## 2018-09-26 MED FILL — hydrOXYzine HCL 10 MG TABS: 10 | 10 days supply | Qty: 30 | Fill #0

## 2018-09-26 MED FILL — FLUoxetine HCL 20 MG TABS: 20 | 30 days supply | Qty: 30 | Fill #0

## 2018-10-09 MED FILL — CLOPIDOGREL 75 MG TABLET: 75 | 30 days supply | Qty: 30 | Fill #2

## 2018-10-12 ENCOUNTER — Telehealth: Payer: Self-pay

## 2018-10-12 NOTE — Telephone Encounter (Signed)
Called to do covid screening, no answer. No voicemail.

## 2018-10-13 ENCOUNTER — Encounter: Payer: Self-pay | Admitting: Family Medicine

## 2018-10-13 ENCOUNTER — Other Ambulatory Visit: Payer: Self-pay

## 2018-10-13 ENCOUNTER — Ambulatory Visit (INDEPENDENT_AMBULATORY_CARE_PROVIDER_SITE_OTHER): Payer: Self-pay | Admitting: Family Medicine

## 2018-10-13 VITALS — BP 125/93 | HR 60 | Temp 98.8°F | Resp 14 | Ht 64.0 in | Wt 169.0 lb

## 2018-10-13 DIAGNOSIS — I10 Essential (primary) hypertension: Secondary | ICD-10-CM

## 2018-10-13 DIAGNOSIS — G4700 Insomnia, unspecified: Secondary | ICD-10-CM

## 2018-10-13 DIAGNOSIS — R42 Dizziness and giddiness: Secondary | ICD-10-CM

## 2018-10-13 DIAGNOSIS — F320 Major depressive disorder, single episode, mild: Secondary | ICD-10-CM

## 2018-10-13 DIAGNOSIS — I639 Cerebral infarction, unspecified: Secondary | ICD-10-CM

## 2018-10-13 LAB — POCT URINALYSIS DIPSTICK
Bilirubin, UA: NEGATIVE
Glucose, UA: NEGATIVE
Ketones, UA: NEGATIVE
Leukocytes, UA: NEGATIVE
Nitrite, UA: NEGATIVE
Protein, UA: NEGATIVE
Spec Grav, UA: 1.015 (ref 1.010–1.025)
Urobilinogen, UA: 0.2 E.U./dL
pH, UA: 6 (ref 5.0–8.0)

## 2018-10-13 MED ORDER — HYDROXYZINE HCL 10 MG PO TABS: ORAL_TABLET | ORAL | 2 refills | Status: DC

## 2018-10-13 MED ORDER — MECLIZINE HCL 25 MG PO TABS: ORAL_TABLET | ORAL | 2 refills | Status: DC

## 2018-10-13 MED ORDER — FLUOXETINE HCL 20 MG PO TABS: ORAL_TABLET | ORAL | 2 refills | Status: DC

## 2018-10-13 MED ORDER — ROSUVASTATIN CALCIUM 20 MG PO TABS: 20.0000 mg | ORAL_TABLET | Freq: Every day | ORAL | 5 refills | Status: DC

## 2018-10-13 MED ORDER — CLOPIDOGREL BISULFATE 75 MG PO TABS: 75.0000 mg | ORAL_TABLET | Freq: Every day | ORAL | 11 refills | Status: DC

## 2018-10-13 MED ORDER — HYDROCHLOROTHIAZIDE 12.5 MG PO TABS: 12.5000 mg | ORAL_TABLET | Freq: Every day | ORAL | 3 refills | Status: DC

## 2018-10-13 MED ORDER — TRAZODONE HCL 50 MG PO TABS: 25.0000 mg | ORAL_TABLET | Freq: Every evening | ORAL | 3 refills | Status: DC | PRN

## 2018-10-13 MED FILL — ?ROSUVASTATIN CALCIUM 20MG: 20 | 30 days supply | Qty: 30 | Fill #0

## 2018-10-13 MED FILL — HYDROCHLOROTHIAZIDE 12.5 MG: 12.5 | 30 days supply | Qty: 30 | Fill #0

## 2018-10-13 MED FILL — ?MECLIZINE 25MG TAB: 25 | 20 days supply | Qty: 60 | Fill #0

## 2018-10-13 MED FILL — hydrOXYzine HCL 10 MG TABS: 10 | 10 days supply | Qty: 30 | Fill #0

## 2018-10-13 MED FILL — traZODone HCL 50 MG TABS: 50 | 30 days supply | Qty: 30 | Fill #0

## 2018-10-13 NOTE — Progress Notes (Signed)
Patient [facility]   Progress Note: General Provider: Lanae Boast, FNP  SUBJECTIVE:   Mark Robles is a 68 y.o. male who  has a past medical history of Depression, DVT (deep venous thrombosis) ([place]), Hyperlipidemia, Left-sided sensory deficit present, Seizure ([place]), and Stroke ([place]).. Patient presents today for Hypertension, Foot Swelling (left foot/ankle ), and Depression Patient presents with his wife who serves as a collateral historian.  Patient with ankle swelling x 2 months that began around the time he was diagnosed with COVID and hospitalized. He has been doing well since discharge. His wife is concerned about intermittent swelling of his left leg. Not present today. He is doing well on depression medication and would like to continue at the present dose. Patient reports compliance with all medications.  Patient denies side effects of medications.     Review of Systems  Constitutional: Negative.   HENT: Negative.   Eyes: Negative.   Respiratory: Negative.   Cardiovascular: Negative.   Gastrointestinal: Negative.   Genitourinary: Negative.   Musculoskeletal: Negative.   Skin: Negative.   Neurological: Negative.   Psychiatric/Behavioral: Negative.      OBJECTIVE: BP (!) 125/93 (BP Location: Right Arm, Patient Position: Sitting, Cuff Size: Normal)   Pulse 60   Temp 98.8 F (37.1 C) (Oral)   Resp 14   Ht 5\' 4"  (1.626 m)   Wt 169 lb (76.7 kg)   SpO2 100%   BMI 29.01 kg/m   Wt Readings from Last 3 Encounters:  10/13/18 169 lb (76.7 kg)  08/15/18 163 lb 5.8 oz (74.1 kg)  07/13/18 178 lb (80.7 kg)     Physical Exam Vitals signs and nursing note reviewed.  Constitutional:      General: He is not in acute distress.    Appearance: Normal appearance.  HENT:     Head: Normocephalic and atraumatic.  Eyes:     Extraocular Movements: Extraocular movements intact.     Conjunctiva/sclera: Conjunctivae normal.     Pupils: Pupils are equal, round, and reactive to light.  Cardiovascular:     Rate and Rhythm: Normal rate and regular rhythm.     Heart sounds: No murmur.  Pulmonary:     Effort: Pulmonary effort is normal.     Breath sounds: Normal breath sounds.  Musculoskeletal:     Right lower leg: No edema.     Left lower leg: No edema.     Comments: Abnormal gait and ambulating with cane.   Skin:    General: Skin is warm and dry.  Neurological:     Mental Status: He is alert and oriented to person, place, and time.     Gait: Gait abnormal.  Psychiatric:        Mood and Affect: Mood normal.        Behavior: Behavior normal.        Thought Content: Thought content normal.        Judgment: Judgment normal.     ASSESSMENT/PLAN:   1. Essential hypertension - Urinalysis Dipstick - rosuvastatin (CRESTOR) 20 MG tablet; Take 1 tablet (20 mg total) by mouth daily.  Dispense: 30 tablet; Refill: 5  2. Cerebrovascular accident (CVA), unspecified mechanism ([place]) - clopidogrel (PLAVIX) 75 MG tablet; Take 1 tablet (75 mg total) by mouth daily.  Dispense: 30 tablet; Refill: 11 - rosuvastatin (CRESTOR) 20 MG tablet; Take 1 tablet (20 mg total) by mouth daily.  Dispense: 30 tablet; Refill: 5  3. Current mild  episode of major depressive disorder without prior episode (H[facility]) - FLUoxetine (PROZAC) 20 MG tablet; TOME 1 PASTILLA POR VIA ORAL CADA MANANA  Dispense: 30 tablet; Refill: 2 - hydrOXYzine (ATARAX/VISTARIL) 10 MG tablet  Dispense: 30 tablet; Refill: 2  4. Vertigo - meclizine (ANTIVERT) 25 MG tablet  Dispense: 60 tablet; Refill: 2    Return in about 3 months (around 01/13/2019).    The patient was given clear instructions to go to ER or return to medical center if symptoms do not improve, worsen or new problems develop. The patient verbalized understanding and agreed with plan of care.   Ms. Freda Jacksonndr L. Riley Lamouglas, FNP-BC [facility] [address]   [place], [place] [id number] [phone]

## 2018-10-14 LAB — COMPREHENSIVE METABOLIC PANEL
ALT: 25 IU/L (ref 0–44)
AST: 27 IU/L (ref 0–40)
Albumin/Globulin Ratio: 2.2 (ref 1.2–2.2)
Albumin: 4.6 g/dL (ref 3.8–4.8)
Alkaline Phosphatase: 41 IU/L (ref 39–117)
BUN/Creatinine Ratio: 15 (ref 10–24)
BUN: 13 mg/dL (ref 8–27)
Bilirubin Total: 0.3 mg/dL (ref 0.0–1.2)
CO2: 23 mmol/L (ref 20–29)
Calcium: 9.4 mg/dL (ref 8.6–10.2)
Chloride: 101 mmol/L (ref 96–106)
Creatinine, Ser: 0.88 mg/dL (ref 0.76–1.27)
GFR calc Af Amer: 104 mL/min/{1.73_m2} (ref >59)
GFR calc non Af Amer: 90 mL/min/{1.73_m2} (ref >59)
Globulin, Total: 2.1 g/dL (ref 1.5–4.5)
Glucose: 87 mg/dL (ref 65–99)
Potassium: 4.3 mmol/L (ref 3.5–5.2)
Sodium: 139 mmol/L (ref 134–144)
Total Protein: 6.7 g/dL (ref 6.0–8.5)

## 2018-10-16 NOTE — Progress Notes (Signed)
Your labs are stable. Continue with your current medications. Please remember to keep your follow up appointment. If you have problems, questions or concerns, please make an appointment to discuss. Thanks!

## 2018-10-30 MED FILL — FLUoxetine HCL 20 MG TABS: 20 | 30 days supply | Qty: 30 | Fill #1

## 2018-11-03 MED FILL — CLOPIDOGREL 75 MG TABLET: 75 | 30 days supply | Qty: 30 | Fill #3

## 2018-11-08 MED FILL — hydrOXYzine HCL 10 MG TABS: 10 | 10 days supply | Qty: 30 | Fill #1

## 2018-11-08 MED FILL — ?ROSUVASTATIN CALCIUM 20MG: 20 | 30 days supply | Qty: 30 | Fill #1

## 2018-11-08 MED FILL — HYDROCHLOROTHIAZIDE 12.5 MG: 12.5 | 30 days supply | Qty: 30 | Fill #1

## 2018-11-08 MED FILL — ?MECLIZINE 25MG TAB: 25 | 20 days supply | Qty: 60 | Fill #1

## 2018-11-08 MED FILL — traZODone HCL 50 MG TABS: 50 | 30 days supply | Qty: 30 | Fill #1

## 2018-11-29 ENCOUNTER — Other Ambulatory Visit: Payer: Self-pay | Admitting: Family Medicine

## 2018-11-29 DIAGNOSIS — H1011 Acute atopic conjunctivitis, right eye: Secondary | ICD-10-CM

## 2018-11-29 MED FILL — ?MECLIZINE 25MG TAB: 25 | 20 days supply | Qty: 60 | Fill #2

## 2018-11-29 MED FILL — hydrOXYzine HCL 10 MG TABS: 10 | 10 days supply | Qty: 30 | Fill #2

## 2018-11-29 MED FILL — FLUoxetine HCL 20 MG TABS: 20 | 30 days supply | Qty: 30 | Fill #2

## 2018-11-29 MED FILL — OLOPATADINE HCL 0.2% EYE DR: 0.2 | 37 days supply | Qty: 3 | Fill #0

## 2018-12-13 MED FILL — CLOPIDOGREL 75 MG TABLET: 75 | 30 days supply | Qty: 30 | Fill #4

## 2018-12-13 MED FILL — HYDROCHLOROTHIAZIDE 12.5 MG: 12.5 | 30 days supply | Qty: 30 | Fill #2

## 2018-12-13 MED FILL — ?MECLIZINE 25MG TAB: 25 | 20 days supply | Qty: 60 | Fill #1

## 2018-12-13 MED FILL — hydrOXYzine HCL 10 MG TABS: 10 | 10 days supply | Qty: 30 | Fill #1

## 2018-12-22 MED FILL — ROSUVASTATIN CALCIUM 20 MG: 20 | 30 days supply | Qty: 30 | Fill #2

## 2018-12-27 ENCOUNTER — Encounter (HOSPITAL_COMMUNITY): Payer: Self-pay

## 2018-12-27 ENCOUNTER — Encounter (HOSPITAL_COMMUNITY): Payer: Self-pay | Admitting: *Deleted

## 2019-01-01 MED FILL — FLUoxetine HCL 20 MG TABS: 20 | 30 days supply | Qty: 30 | Fill #0

## 2019-01-12 MED FILL — hydrOXYzine HCL 10 MG TABS: 10 | 10 days supply | Qty: 30 | Fill #2

## 2019-01-12 MED FILL — HYDROCHLOROTHIAZIDE 12.5 MG: 12.5 | 30 days supply | Qty: 30 | Fill #3

## 2019-01-12 MED FILL — CLOPIDOGREL 75 MG TABLET: 75 | 30 days supply | Qty: 30 | Fill #5

## 2019-01-15 ENCOUNTER — Ambulatory Visit: Payer: Self-pay | Admitting: Family Medicine

## 2019-01-24 ENCOUNTER — Other Ambulatory Visit: Payer: Self-pay | Admitting: Family Medicine

## 2019-01-24 DIAGNOSIS — F320 Major depressive disorder, single episode, mild: Secondary | ICD-10-CM

## 2019-01-24 MED FILL — ROSUVASTATIN CALCIUM 20 MG: 20 | 30 days supply | Qty: 30 | Fill #3

## 2019-01-24 MED FILL — FLUoxetine HCL 20 MG TABS: 20 | 30 days supply | Qty: 30 | Fill #1

## 2019-01-24 MED FILL — hydrOXYzine HCL 10 MG TABS: 10 | 10 days supply | Qty: 30 | Fill #0

## 2019-01-24 MED FILL — ?MECLIZINE 25MG TAB: 25 | 20 days supply | Qty: 60 | Fill #2

## 2019-02-06 ENCOUNTER — Ambulatory Visit: Payer: Self-pay | Admitting: Family Medicine

## 2019-02-14 MED FILL — CLOPIDOGREL 75 MG TABLET: 75 | 30 days supply | Qty: 30 | Fill #6

## 2019-02-14 MED FILL — HYDROCHLOROTHIAZIDE 12.5 MG: 12.5 | 30 days supply | Qty: 30 | Fill #4

## 2019-02-20 ENCOUNTER — Ambulatory Visit: Payer: Self-pay | Admitting: Family Medicine

## 2019-02-22 MED FILL — ?ROSUVASTATIN CALCIUM 20MG: 20 | 30 days supply | Qty: 30 | Fill #4

## 2019-02-22 MED FILL — ?FLUOXETINE HCL 20MG TABLE: 20 | 30 days supply | Qty: 30 | Fill #2

## 2019-03-06 ENCOUNTER — Ambulatory Visit: Payer: Self-pay | Admitting: Family Medicine

## 2019-03-19 ENCOUNTER — Other Ambulatory Visit: Payer: Self-pay | Admitting: Family Medicine

## 2019-03-19 DIAGNOSIS — F320 Major depressive disorder, single episode, mild: Secondary | ICD-10-CM

## 2019-03-19 MED FILL — ?HYDROCHLOROTHIAZIDE 12.5M: 12.5 | 30 days supply | Qty: 30 | Fill #5

## 2019-03-19 MED FILL — ?ROSUVASTATIN CALCIUM 20MG: 20 | 30 days supply | Qty: 30 | Fill #5

## 2019-03-19 MED FILL — CLOPIDOGREL 75 MG TABLET: 75 | 30 days supply | Qty: 30 | Fill #7

## 2019-03-20 MED FILL — ?FLUOXETINE HCL 20MG TABLE: 20 | 30 days supply | Qty: 30 | Fill #0

## 2019-04-09 MED FILL — hydrOXYzine HCL 10 MG TABS: 10 | 10 days supply | Qty: 30 | Fill #1

## 2019-04-16 MED FILL — HYDROCHLOROTHIAZIDE 12.5 MG: 12.5 | 30 days supply | Qty: 30 | Fill #6

## 2019-04-16 MED FILL — FLUoxetine HCL 20 MG TABS: 20 | 30 days supply | Qty: 30 | Fill #1

## 2019-04-16 MED FILL — CLOPIDOGREL 75 MG TABLET: 75 | 30 days supply | Qty: 30 | Fill #8

## 2019-04-16 MED FILL — hydrOXYzine HCL 10 MG TABS: 10 | 10 days supply | Qty: 30 | Fill #2

## 2019-04-30 ENCOUNTER — Other Ambulatory Visit: Payer: Self-pay

## 2019-04-30 DIAGNOSIS — I639 Cerebral infarction, unspecified: Secondary | ICD-10-CM

## 2019-04-30 DIAGNOSIS — I10 Essential (primary) hypertension: Secondary | ICD-10-CM

## 2019-04-30 DIAGNOSIS — R42 Dizziness and giddiness: Secondary | ICD-10-CM

## 2019-04-30 DIAGNOSIS — F320 Major depressive disorder, single episode, mild: Secondary | ICD-10-CM

## 2019-04-30 MED ORDER — HYDROXYZINE HCL 10 MG PO TABS: ORAL_TABLET | ORAL | 0 refills | Status: DC

## 2019-04-30 MED ORDER — MECLIZINE HCL 25 MG PO TABS: ORAL_TABLET | ORAL | 0 refills | Status: DC

## 2019-04-30 MED ORDER — ROSUVASTATIN CALCIUM 20 MG PO TABS: 20.0000 mg | ORAL_TABLET | Freq: Every day | ORAL | 0 refills | Status: DC

## 2019-04-30 MED ORDER — HYDROCHLOROTHIAZIDE 12.5 MG PO TABS: 12.5000 mg | ORAL_TABLET | Freq: Every day | ORAL | 0 refills | Status: DC

## 2019-05-01 MED FILL — ?ROSUVASTATIN CALCIUM 20MG: 20 | 30 days supply | Qty: 30 | Fill #0

## 2019-05-01 MED FILL — hydrOXYzine HCL 10 MG TABS: 10 | 10 days supply | Qty: 30 | Fill #0

## 2019-05-01 MED FILL — MECLIZINE 25 MG TABLET: 25 | 20 days supply | Qty: 60 | Fill #0

## 2019-05-04 ENCOUNTER — Other Ambulatory Visit: Payer: Self-pay

## 2019-05-04 ENCOUNTER — Encounter: Payer: Self-pay | Admitting: Nurse Practitioner

## 2019-05-04 ENCOUNTER — Ambulatory Visit (INDEPENDENT_AMBULATORY_CARE_PROVIDER_SITE_OTHER): Payer: HRSA Program | Admitting: Nurse Practitioner

## 2019-05-04 VITALS — BP 128/76 | HR 60 | Temp 99.0°F | Resp 14 | Ht 64.0 in | Wt 182.0 lb

## 2019-05-04 DIAGNOSIS — I10 Essential (primary) hypertension: Secondary | ICD-10-CM

## 2019-05-04 DIAGNOSIS — E785 Hyperlipidemia, unspecified: Secondary | ICD-10-CM

## 2019-05-04 DIAGNOSIS — I639 Cerebral infarction, unspecified: Secondary | ICD-10-CM

## 2019-05-04 DIAGNOSIS — I69351 Hemiplegia and hemiparesis following cerebral infarction affecting right dominant side: Secondary | ICD-10-CM

## 2019-05-04 DIAGNOSIS — G4700 Insomnia, unspecified: Secondary | ICD-10-CM

## 2019-05-04 LAB — POCT URINALYSIS DIPSTICK
Bilirubin, UA: NEGATIVE
Glucose, UA: NEGATIVE
Ketones, UA: NEGATIVE
Leukocytes, UA: NEGATIVE
Nitrite, UA: NEGATIVE
Protein, UA: NEGATIVE
Spec Grav, UA: 1.015 (ref 1.010–1.025)
Urobilinogen, UA: 0.2 E.U./dL
pH, UA: 7 (ref 5.0–8.0)

## 2019-05-04 MED FILL — traZODone HCL 50 MG TABS: 50 | 30 days supply | Qty: 30 | Fill #2

## 2019-05-04 NOTE — Patient Instructions (Signed)
Cervical Sprain  A cervical sprain is a stretch or tear in one or more of the tough, cord-like tissues that connect bones (ligaments) in the neck. Cervical sprains can range from mild to severe. Severe cervical sprains can cause the spinal bones (vertebrae) in the neck to be unstable. This can lead to spinal cord damage and can result in serious nervous system problems. The amount of time that it takes for a cervical sprain to get better depends on the cause and extent of the injury. Most cervical sprains heal in 4-6 weeks. What are the causes? Cervical sprains may be caused by an injury (trauma), such as from a motor vehicle accident, a fall, or sudden forward and backward whipping movement of the head and neck (whiplash injury). Mild cervical sprains may be caused by wear and tear over time, such as from poor posture, sitting in a chair that does not provide support, or looking up or down for long periods of time. What increases the risk? The following factors may make you more likely to develop this condition:  Participating in activities that have a high risk of trauma to the neck. These include contact sports, auto racing, gymnastics, and diving.  Taking risks when driving or riding in a motor vehicle, such as speeding.  Having osteoarthritis of the spine.  Having poor strength and flexibility of the neck.  A previous neck injury.  Having poor posture.  Spending a lot of time in certain positions that put stress on the neck, such as sitting at a computer for long periods of time. What are the signs or symptoms? Symptoms of this condition include:  Pain, soreness, stiffness, tenderness, swelling, or a burning sensation in the front, back, or sides of the neck.  Sudden tightening of neck muscles that you cannot control (muscle spasms).  Pain in the shoulders or upper back.  Limited ability to move the neck.  Headache.  Dizziness.  Nausea.  Vomiting.  Weakness, numbness,  or tingling in a hand or an arm. Symptoms may develop right away after injury, or they may develop over a few days. In some cases, symptoms may go away with treatment and return (recur) over time. How is this diagnosed? This condition may be diagnosed based on:  Your medical history.  Your symptoms.  Any recent injuries or known neck problems that you have, such as arthritis in the neck.  A physical exam.  Imaging tests, such as: ? X-rays. ? MRI. ? CT scan. How is this treated? This condition is treated by resting and icing the injured area and doing physical therapy exercises. Depending on the severity of your condition, treatment may also include:  Keeping your neck in place (immobilized) for periods of time. This may be done using: ? A cervical collar. This supports your chin and the back of your head. ? A cervical traction device. This is a sling that holds up your head. This removes weight and pressure from your neck, and it may help to relieve pain.  Medicines that help to relieve pain and inflammation.  Medicines that help to relax your muscles (muscle relaxants).  Surgery. This is rare. Follow these instructions at home: If you have a cervical collar:   Wear it as told by your health care provider. Do not remove the collar unless instructed by your health care provider.  Ask your health care provider before you make any adjustments to your collar.  If you have long hair, keep it outside of the  collar.  Ask your health care provider if you can remove the collar for cleaning and bathing. If you are allowed to remove the collar for cleaning or bathing: ? Follow instructions from your health care provider about how to remove the collar safely. ? Clean the collar by wiping it with mild soap and water and drying it completely. ? If your collar has removable pads, remove them every 1-2 days and wash them by hand with soap and water. Let them air-dry completely before you  put them back in the collar. ? Check your skin under the collar for irritation or sores. If you see any, tell your health care provider. Managing pain, stiffness, and swelling   If directed, use a cervical traction device as told by your health care provider.  If directed, apply heat to the affected area before you do your physical therapy or as often as told by your health care provider. Use the heat source that your health care provider recommends, such as a moist heat pack or a heating pad. ? Place a towel between your skin and the heat source. ? Leave the heat on for 20-30 minutes. ? Remove the heat if your skin turns bright red. This is especially important if you are unable to feel pain, heat, or cold. You may have a greater risk of getting burned.  If directed, put ice on the affected area: ? Put ice in a plastic bag. ? Place a towel between your skin and the bag. ? Leave the ice on for 20 minutes, 2-3 times a day. Activity  Do not drive while wearing a cervical collar. If you do not have a cervical collar, ask your health care provider if it is safe to drive while your neck heals.  Do not drive or use heavy machinery while taking prescription pain medicine or muscle relaxants, unless your health care provider approves.  Do not lift anything that is heavier than 10 lb (4.5 kg) until your health care provider tells you that it is safe.  Rest as directed by your health care provider. Avoid positions and activities that make your symptoms worse. Ask your health care provider what activities are safe for you.  If physical therapy was prescribed, do exercises as told by your health care provider or physical therapist. General instructions  Take over-the-counter and prescription medicines only as told by your health care provider.  Do not use any products that contain nicotine or tobacco, such as cigarettes and e-cigarettes. These can delay healing. If you need help quitting, ask your  health care provider.  Keep all follow-up visits as told by your health care provider or physical therapist. This is important. How is this prevented? To prevent a cervical sprain from happening again:  Use and maintain good posture. Make any needed adjustments to your workstation to help you use good posture.  Exercise regularly as directed by your health care provider or physical therapist.  Avoid risky activities that may cause a cervical sprain. Contact a health care provider if:  You have symptoms that get worse or do not get better after 2 weeks of treatment.  You have pain that gets worse or does not get better with medicine.  You develop new, unexplained symptoms.  You have sores or irritated skin on your neck from wearing your cervical collar. Get help right away if:  You have severe pain.  You develop numbness, tingling, or weakness in any part of your body.  You cannot move  a part of your body (you have paralysis).  You have neck pain along with: ? Severe dizziness. ? Headache. Summary  A cervical sprain is a stretch or tear in one or more of the tough, cord-like tissues that connect bones (ligaments) in the neck.  Cervical sprains may be caused by an injury (trauma), such as from a motor vehicle accident, a fall, or sudden forward and backward whipping movement of the head and neck (whiplash injury).  Symptoms may develop right away after injury, or they may develop over a few days.  This condition is treated by resting and icing the injured area and doing physical therapy exercises. This information is not intended to replace advice given to you by your health care provider. Make sure you discuss any questions you have with your health care provider. Document Revised: 07/26/2018 Document Reviewed: 12/03/2015 Elsevier Patient Education  2020 [employer] Dizziness Dizziness is a common problem. It makes you feel unsteady or light-headed. You may feel like you  are about to pass out (faint). Dizziness can lead to getting hurt if you stumble or fall. Dizziness can be caused by many things, including:  Medicines.  Not having enough water in your body (dehydration).  Illness. Follow these instructions at home: Eating and drinking   Drink enough fluid to keep your pee (urine) clear or pale yellow. This helps to keep you from getting dehydrated. Try to drink more clear fluids, such as water.  Do not drink alcohol.  Limit how much caffeine you drink or eat, if your doctor tells you to do that.  Limit how much salt (sodium) you drink or eat, if your doctor tells you to do that. Activity   Avoid making quick movements. ? When you stand up from sitting in a chair, steady yourself until you feel okay. ? In the morning, first sit up on the side of the bed. When you feel okay, stand slowly while you hold onto something. Do this until you know that your balance is fine.  If you need to stand in one place for a long time, move your legs often. Tighten and relax the muscles in your legs while you are standing.  Do not drive or use heavy machinery if you feel dizzy.  Avoid bending down if you feel dizzy. Place items in your home so you can reach them easily without leaning over. Lifestyle  Do not use any products that contain nicotine or tobacco, such as cigarettes and e-cigarettes. If you need help quitting, ask your doctor.  Try to lower your stress level. You can do this by using methods such as yoga or meditation. Talk with your doctor if you need help. General instructions  Watch your dizziness for any changes.  Take over-the-counter and prescription medicines only as told by your doctor. Talk with your doctor if you think that you are dizzy because of a medicine that you are taking.  Tell a friend or a family member that you are feeling dizzy. If he or she notices any changes in your behavior, have this person call your doctor.  Keep all  follow-up visits as told by your doctor. This is important. Contact a doctor if:  Your dizziness does not go away.  Your dizziness or light-headedness gets worse.  You feel sick to your stomach (nauseous).  You have trouble hearing.  You have new symptoms.  You are unsteady on your feet.  You feel like the room is spinning. Get help right away  if:  You throw up (vomit) or have watery poop (diarrhea), and you cannot eat or drink anything.  You have trouble: ? Talking. ? Walking. ? Swallowing. ? Using your arms, hands, or legs.  You feel generally weak.  You are not thinking clearly, or you have trouble forming sentences. A friend or family member may notice this.  You have: ? Chest pain. ? Pain in your belly (abdomen). ? Shortness of breath. ? Sweating.  Your vision changes.  You are bleeding.  You have a very bad headache.  You have neck pain or a stiff neck.  You have a fever. These symptoms may be an emergency. Do not wait to see if the symptoms will go away. Get medical help right away. Call your local emergency services (911 in the [place] Do not drive yourself to the hospital. Summary  Dizziness makes you feel unsteady or light-headed. You may feel like you are about to pass out (faint).  Drink enough fluid to keep your pee (urine) clear or pale yellow. Do not drink alcohol.  Avoid making quick movements if you feel dizzy.  Watch your dizziness for any changes. This information is not intended to replace advice given to you by your health care provider. Make sure you discuss any questions you have with your health care provider. Document Revised: 04/08/2017 Document Reviewed: 04/22/2016 Elsevier Patient Education  [place].

## 2019-05-04 NOTE — Progress Notes (Signed)
Established Patient Office Visit  Subjective:  Patient ID: Mark Robles, male    DOB: 15-Jul-1950  Age: 69 y.o. MRN: [id number]  CC:  Chief Complaint  Patient presents with  . Hypertension  . Insomnia  . Anxiety    "trys to do things fast due to anxiety"    HPI Mark Robles presents for follow-up.  He has a history of stroke left-sided deficit; 2011, hypertension hyperlipidemia depression and insomnia.  He is in today with his wife.  Spanish interpreter in use.  He does not regularly monitor BP at home.  His wife feels his BP is okay.Marland Kitchen He is taking his medication as directed.  Denies headache, shortness of breath or chest pain.  He was diagnosed with coronavirus March 2020.  His wife feels like he has been having increased left foot swelling on and off.  She admits that is worse when he is up for long periods or walking more.  The swelling does resolve overnight.  He is having problems sleeping. He is taking not taking the trazodone, because it was not refilled.   He is only sleeping a few hours each night.  He has a bed at 9 PM and is up again around 1 or 2 AM.  His wife admits that the trazodone was effective when he was taking it it did seem to help him rest more.He is having some pain in his neck on the right-side of his head. He has problems with his pillows and with positioning. He has pain that radiates to the shoulders. He feels like this maybe the cause of his dizziness. He has constant pain that is worse at night. He sleeps on his right side. He is not able to tolerate sleeping on the left side.  He is having some problems with anxiety. He is on Prozac.  His wife admits that he rushes a lot with different activities especially eating and walking. His family encourages him to slow down.  He admits that he does this because he does not want to be a burden on anyone.  He denies any falls or injuries.  Past Medical History:  Diagnosis Date  .  Depression   . DVT (deep venous thrombosis) (HCC)   . Hyperlipidemia   . Left-sided sensory deficit present   . Seizure (HCC)   . Stroke [facility])     Past Surgical History:  Procedure Laterality Date  . NO PAST SURGERIES      Family History  Problem Relation Age of Onset  . Hypertension Mother        tachycardia, pacemaker  . Hypertension Sister   . Hypertension Brother   . Hypertension Maternal Uncle     Social History   Socioeconomic History  . Marital status: Married    Spouse name: Not on file  . Number of children: 3  . Years of education: 6th grade  . Highest education level: Not on file  Occupational History  . Not on file  Tobacco Use  . Smoking status: Never Smoker  . Smokeless tobacco: Never Used  Substance and Sexual Activity  . Alcohol use: No  . Drug use: No  . Sexual activity: Not on file  Other Topics Concern  . Not on file  Social History Narrative   Lives at home with wife & 1 child   Right handed   No caffeine    Social Determinants of Health   Financial Resource Strain:   . Difficulty of Paying  Living Expenses: Not on file  Food Insecurity:   . Worried About Charity fundraiser in the Last Year: Not on file  . Ran Out of Food in the Last Year: Not on file  Transportation Needs:   . Lack of Transportation (Medical): Not on file  . Lack of Transportation (Non-Medical): Not on file  Physical Activity:   . Days of Exercise per Week: Not on file  . Minutes of Exercise per Session: Not on file  Stress:   . Feeling of Stress : Not on file  Social Connections:   . Frequency of Communication with Friends and Family: Not on file  . Frequency of Social Gatherings with Friends and Family: Not on file  . Attends Religious Services: Not on file  . Active Member of Clubs or Organizations: Not on file  . Attends Archivist Meetings: Not on file  . Marital Status: Not on file  Intimate Partner Violence:   . Fear of Current or Ex-Partner:  Not on file  . Emotionally Abused: Not on file  . Physically Abused: Not on file  . Sexually Abused: Not on file    Outpatient Medications Prior to Visit  Medication Sig Dispense Refill  . clopidogrel (PLAVIX) 75 MG tablet Take 1 tablet (75 mg total) by mouth daily. 30 tablet 11  . FLUoxetine (PROZAC) 20 MG tablet TOME 1 PASTILLA POR VIA ORAL CADA MANANA 30 tablet 2  . fluticasone (FLONASE) 50 MCG/ACT nasal spray Place 2 sprays into both nostrils daily. 16 g 6  . hydrochlorothiazide (HYDRODIURIL) 12.5 MG tablet Take 1 tablet (12.5 mg total) by mouth daily. 30 tablet 0  . hydrOXYzine (ATARAX/VISTARIL) 10 MG tablet Take 1 tablet three times daily as needed. 30 tablet 0  . meclizine (ANTIVERT) 25 MG tablet TAKE 1 TABLET BY MOUTH 3 TIMES DAILY AS NEEDED FOR DIZZINESS. 60 tablet 0  . Olopatadine HCl 0.2 % SOLN APPLY 1 DROP TO EYE DAILY AS NEEDED (ITCHING EYES). 2.5 mL 0  . rosuvastatin (CRESTOR) 20 MG tablet Take 1 tablet (20 mg total) by mouth daily. 30 tablet 0  . traZODone (DESYREL) 50 MG tablet Take 0.5-1 tablets (25-50 mg total) by mouth at bedtime as needed for sleep. 30 tablet 3  . famotidine (PEPCID) 20 MG tablet Take 1 tablet (20 mg total) by mouth 2 (two) times daily. (Patient not taking: Reported on 10/13/2018) 60 tablet 2  . loperamide (IMODIUM) 2 MG capsule TAKE 2 CAPSULES BY MOUTH INITIALLY THEN 1 CAPSULE AFTER EACH LOOSE STOOL (DO NOT EXCEED 8 CAPSULES IN 24 HOURS)  0   No facility-administered medications prior to visit.    Allergies  Allergen Reactions  . Amlodipine Swelling    ROS Review of Systems  Constitutional: Negative.   HENT: Negative.   Eyes: Negative.   Cardiovascular: Positive for leg swelling.  Gastrointestinal: Negative.   Endocrine: Negative.   Genitourinary: Negative.   Musculoskeletal: Positive for neck pain.  Skin: Negative.   Allergic/Immunologic: Negative.   Neurological: Positive for dizziness.       Left paraplegia  Hematological: Negative.    Psychiatric/Behavioral: Negative.       Objective:    Physical Exam  Constitutional: He appears well-developed and well-nourished.  HENT:  Head: Normocephalic and atraumatic.  Neck:  Slight tenderness at the base of his neck with palpation  Cardiovascular: Normal rate, regular rhythm and normal heart sounds.  Pulmonary/Chest: Effort normal and breath sounds normal.  Abdominal: Soft. Bowel sounds are normal.  Musculoskeletal:     Cervical back: Normal range of motion.     Comments: Left-sided upper extremity weakness  Neurological: He exhibits normal muscle tone. Coordination normal.  Left upper extremity    BP 128/76 (BP Location: Right Arm, Patient Position: Sitting, Cuff Size: Normal)   Pulse 60   Temp 99 F (37.2 C) (Oral)   Resp 14   Ht 5\' 4"  (1.626 m)   Wt 182 lb (82.6 kg)   SpO2 100%   BMI 31.24 kg/m  Wt Readings from Last 3 Encounters:  05/04/19 182 lb (82.6 kg)  10/13/18 169 lb (76.7 kg)  08/15/18 163 lb 5.8 oz (74.1 kg)     Health Maintenance Due  Topic Date Due  . Hepatitis C Screening  03-Feb-1951  . COLONOSCOPY  01/18/2003    There are no preventive care reminders to display for this patient.  No results found for: TSH Lab Results  Component Value Date   WBC 5.6 08/16/2018   HGB 14.3 08/16/2018   HCT 43.8 08/16/2018   MCV 92.6 08/16/2018   PLT 359 08/16/2018   Lab Results  Component Value Date   NA 139 10/13/2018   K 4.3 10/13/2018   CO2 23 10/13/2018   GLUCOSE 87 10/13/2018   BUN 13 10/13/2018   CREATININE 0.88 10/13/2018   BILITOT 0.3 10/13/2018   ALKPHOS 41 10/13/2018   AST 27 10/13/2018   ALT 25 10/13/2018   PROT 6.7 10/13/2018   ALBUMIN 4.6 10/13/2018   CALCIUM 9.4 10/13/2018   ANIONGAP 7 08/16/2018   Lab Results  Component Value Date   CHOL 132 09/19/2017   Lab Results  Component Value Date   HDL 50 09/19/2017   Lab Results  Component Value Date   LDLCALC 63 09/19/2017   Lab Results  Component Value Date   TRIG  117 08/12/2018   Lab Results  Component Value Date   CHOLHDL 2.6 09/19/2017   Lab Results  Component Value Date   HGBA1C 5.4 05/16/2017      Assessment & Plan:   Problem List Items Addressed This Visit      High   Essential hypertension - Primary   Relevant Orders   Urinalysis Dipstick      No orders of the defined types were placed in this encounter.   Follow-up: No follow-ups on file.    05/18/2017, NP

## 2019-05-05 LAB — COMP. METABOLIC PANEL (12)
AST: 26 IU/L (ref 0–40)
Albumin/Globulin Ratio: 2.2 (ref 1.2–2.2)
Albumin: 4.6 g/dL (ref 3.8–4.8)
Alkaline Phosphatase: 44 IU/L (ref 39–117)
BUN/Creatinine Ratio: 12 (ref 10–24)
BUN: 11 mg/dL (ref 8–27)
Bilirubin Total: 0.3 mg/dL (ref 0.0–1.2)
Calcium: 9.6 mg/dL (ref 8.6–10.2)
Chloride: 99 mmol/L (ref 96–106)
Creatinine, Ser: 0.93 mg/dL (ref 0.76–1.27)
GFR calc Af Amer: 99 mL/min/{1.73_m2} (ref >59)
GFR calc non Af Amer: 85 mL/min/{1.73_m2} (ref >59)
Globulin, Total: 2.1 g/dL (ref 1.5–4.5)
Glucose: 93 mg/dL (ref 65–99)
Potassium: 4.4 mmol/L (ref 3.5–5.2)
Sodium: 137 mmol/L (ref 134–144)
Total Protein: 6.7 g/dL (ref 6.0–8.5)

## 2019-05-05 LAB — LIPID PANEL
Chol/HDL Ratio: 3.4 ratio (ref 0.0–5.0)
Cholesterol, Total: 195 mg/dL (ref 100–199)
HDL: 57 mg/dL (ref >39)
LDL Chol Calc (NIH): 108 mg/dL — ABNORMAL HIGH (ref 0–99)
Triglycerides: 176 mg/dL — ABNORMAL HIGH (ref 0–149)
VLDL Cholesterol Cal: 30 mg/dL (ref 5–40)

## 2019-05-05 LAB — VITAMIN B12: Vitamin B-12: 708 pg/mL (ref 232–1245)

## 2019-05-05 LAB — VITAMIN D 25 HYDROXY (VIT D DEFICIENCY, FRACTURES): Vit D, 25-Hydroxy: 24.7 ng/mL — ABNORMAL LOW (ref 30.0–100.0)

## 2019-05-15 ENCOUNTER — Ambulatory Visit: Payer: Self-pay | Admitting: Family Medicine

## 2019-05-17 MED FILL — CLOPIDOGREL 75 MG TABLET: 75 | 30 days supply | Qty: 30 | Fill #9

## 2019-05-21 MED FILL — HYDROCHLOROTHIAZIDE 12.5 MG: 12.5 | 30 days supply | Qty: 30 | Fill #7

## 2019-05-21 MED FILL — FLUoxetine HCL 20 MG TABS: 20 | 30 days supply | Qty: 30 | Fill #2

## 2019-05-23 ENCOUNTER — Encounter: Payer: Self-pay | Admitting: Nurse Practitioner

## 2019-05-23 DIAGNOSIS — E559 Vitamin D deficiency, unspecified: Secondary | ICD-10-CM | POA: Insufficient documentation

## 2019-05-25 ENCOUNTER — Ambulatory Visit: Payer: Self-pay

## 2019-06-04 ENCOUNTER — Other Ambulatory Visit: Payer: Self-pay | Admitting: Internal Medicine

## 2019-06-04 DIAGNOSIS — R42 Dizziness and giddiness: Secondary | ICD-10-CM

## 2019-06-04 DIAGNOSIS — I10 Essential (primary) hypertension: Secondary | ICD-10-CM

## 2019-06-04 DIAGNOSIS — I639 Cerebral infarction, unspecified: Secondary | ICD-10-CM

## 2019-06-04 MED FILL — ?TRAZODONE HCL 50 TABS: 50 | 30 days supply | Qty: 30 | Fill #3

## 2019-06-11 ENCOUNTER — Telehealth: Payer: Self-pay | Admitting: Family Medicine

## 2019-06-11 DIAGNOSIS — R42 Dizziness and giddiness: Secondary | ICD-10-CM

## 2019-06-11 DIAGNOSIS — I10 Essential (primary) hypertension: Secondary | ICD-10-CM

## 2019-06-11 DIAGNOSIS — I639 Cerebral infarction, unspecified: Secondary | ICD-10-CM

## 2019-06-11 DIAGNOSIS — K219 Gastro-esophageal reflux disease without esophagitis: Secondary | ICD-10-CM

## 2019-06-11 DIAGNOSIS — G4700 Insomnia, unspecified: Secondary | ICD-10-CM

## 2019-06-11 DIAGNOSIS — F320 Major depressive disorder, single episode, mild: Secondary | ICD-10-CM

## 2019-06-11 MED ORDER — CLOPIDOGREL BISULFATE 75 MG PO TABS: 75.0000 mg | ORAL_TABLET | Freq: Every day | ORAL | 11 refills | Status: DC

## 2019-06-11 MED ORDER — TRAZODONE HCL 50 MG PO TABS: 25.0000 mg | ORAL_TABLET | Freq: Every evening | ORAL | 3 refills | Status: DC | PRN

## 2019-06-11 MED ORDER — MECLIZINE HCL 25 MG PO TABS: ORAL_TABLET | ORAL | 3 refills | Status: DC

## 2019-06-11 MED ORDER — ROSUVASTATIN CALCIUM 20 MG PO TABS: 20.0000 mg | ORAL_TABLET | Freq: Every day | ORAL | 3 refills | Status: DC

## 2019-06-11 MED ORDER — FLUOXETINE HCL 20 MG PO TABS: ORAL_TABLET | ORAL | 3 refills | Status: DC

## 2019-06-11 MED ORDER — FAMOTIDINE 20 MG PO TABS: 20.0000 mg | ORAL_TABLET | Freq: Two times a day (BID) | ORAL | 2 refills | Status: DC

## 2019-06-11 MED ORDER — HYDROXYZINE HCL 10 MG PO TABS: ORAL_TABLET | ORAL | 3 refills | Status: DC

## 2019-06-11 MED ORDER — HYDROCHLOROTHIAZIDE 12.5 MG PO TABS: 12.5000 mg | ORAL_TABLET | Freq: Every day | ORAL | 3 refills | Status: DC

## 2019-06-11 MED FILL — ?MECLIZINE 25MG TAB: 25 | 20 days supply | Qty: 60 | Fill #0

## 2019-06-11 MED FILL — hydrOXYzine HCL 10 MG TABS: 10 | 10 days supply | Qty: 30 | Fill #0

## 2019-06-11 MED FILL — FAMOTIDINE 20 MG TABS: 20 | 30 days supply | Qty: 60 | Fill #0

## 2019-06-11 MED FILL — ?ROSUVASTATIN CALCIUM 20MG: 20 | 30 days supply | Qty: 30 | Fill #0

## 2019-06-11 NOTE — Telephone Encounter (Signed)
Refills sent to pharmacy. Thanks.

## 2019-06-18 MED FILL — FLUoxetine HCL 20 MG TABS: 20 | 30 days supply | Qty: 30 | Fill #0

## 2019-06-18 MED FILL — CLOPIDOGREL 75 MG TABLET: 75 | 30 days supply | Qty: 30 | Fill #10

## 2019-06-18 MED FILL — HYDROCHLOROTHIAZIDE 12.5 MG: 12.5 | 30 days supply | Qty: 30 | Fill #8

## 2019-06-18 MED FILL — hydrOXYzine HCL 10 MG TABS: 10 | 10 days supply | Qty: 30 | Fill #1

## 2019-07-11 MED FILL — HYDROCHLOROTHIAZIDE 12.5 MG: 12.5 | 30 days supply | Qty: 30 | Fill #9

## 2019-07-11 MED FILL — FAMOTIDINE 20 MG TABS: 20 | 30 days supply | Qty: 60 | Fill #1

## 2019-07-11 MED FILL — ?ROSUVASTATIN CALCIUM 20MG: 20 | 30 days supply | Qty: 30 | Fill #1

## 2019-07-11 MED FILL — ?TRAZODONE HCL 50 TABS: 50 | 30 days supply | Qty: 30 | Fill #0

## 2019-07-11 MED FILL — FLUoxetine HCL 20 MG TABS: 20 | 30 days supply | Qty: 30 | Fill #1

## 2019-07-23 MED FILL — CLOPIDOGREL 75 MG TABLET: 75 | 30 days supply | Qty: 30 | Fill #0

## 2019-07-23 MED FILL — MECLIZINE 25 MG TABLET: 25 | 20 days supply | Qty: 60 | Fill #1

## 2019-07-30 ENCOUNTER — Ambulatory Visit: Payer: Self-pay

## 2019-08-02 ENCOUNTER — Ambulatory Visit: Payer: Self-pay

## 2019-08-02 ENCOUNTER — Other Ambulatory Visit: Payer: Self-pay

## 2019-08-02 ENCOUNTER — Ambulatory Visit (INDEPENDENT_AMBULATORY_CARE_PROVIDER_SITE_OTHER): Payer: Self-pay | Admitting: Nurse Practitioner

## 2019-08-02 ENCOUNTER — Encounter: Payer: Self-pay | Admitting: Nurse Practitioner

## 2019-08-02 ENCOUNTER — Ambulatory Visit (HOSPITAL_COMMUNITY)
Admission: RE | Admit: 2019-08-02 | Discharge: 2019-08-02 | Disposition: A | Discharge status: Home / Self Care | Payer: Self-pay | Source: Ambulatory Visit | Attending: Nurse Practitioner | Admitting: Nurse Practitioner

## 2019-08-02 VITALS — BP 134/86 | HR 60 | Temp 98.9°F | Ht 64.0 in | Wt 180.6 lb

## 2019-08-02 DIAGNOSIS — Z7901 Long term (current) use of anticoagulants: Secondary | ICD-10-CM

## 2019-08-02 DIAGNOSIS — G8929 Other chronic pain: Secondary | ICD-10-CM

## 2019-08-02 DIAGNOSIS — I10 Essential (primary) hypertension: Secondary | ICD-10-CM

## 2019-08-02 DIAGNOSIS — M542 Cervicalgia: Secondary | ICD-10-CM | POA: Insufficient documentation

## 2019-08-02 DIAGNOSIS — E785 Hyperlipidemia, unspecified: Secondary | ICD-10-CM

## 2019-08-02 DIAGNOSIS — R7989 Other specified abnormal findings of blood chemistry: Secondary | ICD-10-CM

## 2019-08-02 DIAGNOSIS — F320 Major depressive disorder, single episode, mild: Secondary | ICD-10-CM

## 2019-08-02 DIAGNOSIS — I639 Cerebral infarction, unspecified: Secondary | ICD-10-CM

## 2019-08-02 DIAGNOSIS — Z0000 Encounter for general adult medical examination without abnormal findings: Secondary | ICD-10-CM

## 2019-08-02 DIAGNOSIS — F5102 Adjustment insomnia: Secondary | ICD-10-CM

## 2019-08-02 LAB — POCT URINALYSIS DIPSTICK
Bilirubin, UA: NEGATIVE
Glucose, UA: NEGATIVE
Ketones, UA: NEGATIVE
Leukocytes, UA: NEGATIVE
Nitrite, UA: NEGATIVE
Protein, UA: NEGATIVE
Spec Grav, UA: 1.025 (ref 1.010–1.025)
Urobilinogen, UA: 0.2 E.U./dL
pH, UA: 7 (ref 5.0–8.0)

## 2019-08-02 LAB — POCT GLYCOSYLATED HEMOGLOBIN (HGB A1C): Hemoglobin A1C: 5.6 % (ref 4.0–5.6)

## 2019-08-02 LAB — GLUCOSE, POCT (MANUAL RESULT ENTRY): POC Glucose: 101 mg/dl — AB (ref 70–99)

## 2019-08-02 NOTE — Patient Instructions (Addendum)
Cmo controlar su hipertensin Managing Your Hypertension La hipertensin se denomina usualmente presin arterial alta. Ocurre cuando la sangre presiona contra las paredes de las arterias con demasiada fuerza. Las arterias son los vasos sanguneos que transportan la sangre desde el corazn hacia todas las partes del cuerpo. La hipertensin hace que el corazn haga ms esfuerzo para Chiropodist y [employer] que las arterias se Teacher, music o Advertising account executive. La hipertensin no tratada o no controlada puede causar infarto de miocardio, accidente cerebrovascular, enfermedad renal y otros problemas. Qu son las Futures trader de presin arterial? Una lectura de la presin arterial consiste de un nmero ms alto sobre un nmero ms bajo. En condiciones ideales, la presin arterial debe estar por debajo de 120/80. El primer nmero ("superior") es la presin sistlica. Es la medida de la presin de las arterias cuando el corazn late. El segundo nmero ("inferior") es la presin diastlica. Es la medida de la presin en las arterias cuando el corazn se relaja. Qu significa mi lectura de presin arterial? La presin arterial se clasifica en cuatro etapas. Sobre la base de la lectura de su presin arterial, el mdico puede usar las siguientes etapas para determinar si necesita tratamiento y de qu tipo. La presin sistlica y la presin diastlica se miden en una unidad llamada mm Hg. Normal  Presin sistlica: por debajo de 409.  Presin diastlica: por debajo de 80. Elevada  Presin sistlica: 811-914.  Presin diastlica: por debajo de 80. Etapa 1 de hipertensin  Presin sistlica: 782-956.  Presin diastlica: 21-30. Etapa 2 de hipertensin  Presin sistlica: 865 o ms.  Presin diastlica: 90 o ms. Cules son los riesgos para la salud asociados con la hipertensin? Controlar la hipertensin es una responsabilidad importante. La hipertensin no controlada puede causar:  Infarto de  miocardio.  Accidente cerebrovascular.  Debilitamiento de los vasos sanguneos (aneurisma).  Insuficiencia cardaca.  Dao renal.  Dao ocular.  Sndrome metablico.  Problemas de memoria y concentracin. Qu cambios puedo hacer para controlar mi hipertensin? La hipertensin se puede controlar haciendo [employer] Corporation estilo de vida y, posiblemente, tomando medicamentos. Su mdico le ayudar a crear un plan para bajar la presin arterial al rango normal. Comida y bebida   Siga una dieta con alto contenido de fibras y [place], y con bajo contenido de sal (sodio), azcar agregada y Daphene Jaeger. Un ejemplo de plan alimenticio es la dieta DASH (Dietary Approaches to Stop Hypertension, Mtodos alimenticios para detener la hipertensin). Para alimentarse de esta manera: ? Coma mucha fruta y [place]. Trate de que la mitad del plato de cada comida sea de frutas y verduras. ? Coma cereales integrales, como pasta integral, arroz integral y pan integral. Llene aproximadamente un cuarto del plato con cereales integrales. ? Consuma productos lcteos con bajo contenido de grasa. ? Evite la ingesta de cortes de carne grasa, carne procesada o curada, y carne de ave con piel. Llene aproximadamente un cuarto del plato con protenas magras, como pescado, pollo sin piel, frijoles, huevos y tofu. ? Evite ingerir alimentos prehechos o procesados. En general, estos tienen mayor cantidad de sodio, azcar agregada y Wendee Copp.  Reduzca su ingesta diaria de sodio. La mayora de las personas que tienen hipertensin deben comer menos de 1500 mg de sodio por [employer].  Limite el consumo de alcohol a no ms de 1 medida por da si es mujer y no est Music therapist y a 2 medidas por da si es hombre. Una medida equivale a 12onzas de cerveza, Public house manager de vino  o 1onzas de bebidas alcohlicas de alta graduacin. Estilo de vida  Trabaje con su mdico para mantener un peso saludable o Administrator, Civil Service. Pregntele cual es su peso  recomendado.  Realice al menos 30 minutos de ejercicio que haga que se acelere su corazn (ejercicio Arboriculturist) la [employer] de la [place]. Estas actividades pueden incluir caminar, nadar o andar en bicicleta.  Incluya ejercicios para fortalecer sus msculos (ejercicios de resistencia), como levantamiento de pesas, como parte de su rutina semanal de ejercicios. Intente realizar 68minutos de este tipo de ejercicios al [employer] a la [place].  No consuma ningn producto que contenga nicotina o tabaco, como cigarrillos y Psychologist, sport and exercise. Si necesita ayuda para dejar de fumar, consulte al [employer].  Controle las enfermedades a largo plazo (crnicas), como el colesterol alto o la diabetes. Control  Contrlese la presin arterial en su casa segn las indicaciones del mdico. La presin arterial deseada puede variar en funcin de las enfermedades, la edad y otros factores personales.  Contrlese la presin arterial de manera regular, en la frecuencia indicada por su mdico. Trabaje con su mdico  Revise con su mdico todos los medicamentos que toma ya que puede haber efectos secundarios o interacciones.  Hable con su mdico acerca de la dieta, hbitos de ejercicio y otros factores del estilo de vida que pueden contribuir a la hipertensin.  Consulte a su mdico regularmente. Su mdico puede ayudarle a crear y [place] su plan para controlar la hipertensin. Debo tomar un medicamento para controlar mi presin arterial? El mdico puede recetarle medicamentos si los cambios en el estilo de vida no son suficientes para Child psychotherapist la presin arterial y si:  Su presin arterial sistlica es de 024 o ms.  Su presin arterial diastlica es de 80 o ms. Tome los medicamentos solamente como se lo haya indicado el mdico. Siga cuidadosamente las indicaciones. Los medicamentos para la presin arterial deben tomarse segn las indicaciones. Los medicamentos pierden eficacia al  omitir las dosis. El hecho de omitir las dosis tambin [place] el riesgo de otros problemas. Comunquese con un mdico si:  Piensa que tiene Nurse, mental health a los medicamentos que ha tomado.  Tiene dolores de cabeza frecuentes (recurrentes).  Siente mareos.  Tiene hinchazn en los tobillos.  Tiene problemas de visin. Solicite ayuda de inmediato si:  Siente un dolor de cabeza intenso o confusin.  Siente debilidad inusual, adormecimiento o que Geneticist, molecular.  Siente un dolor intenso en el pecho o el abdomen.  Vomita repetidas veces.  Tiene dificultad para respirar. Resumen  La hipertensin se produce cuando la sangre bombea en las arterias con mucha fuerza. Si esta afeccin no se controla, podra correr riesgo de tener complicaciones graves.  La presin arterial deseada puede variar en funcin de las enfermedades, la edad y otros factores personales. Para la [employer], una presin arterial normal es menor que 120/80.  La hipertensin se puede controlar mediante cambios en el estilo de vida, tomando medicamentos, o ambas cosas. Los McDonald's Corporation estilo de vida incluyen prdida de peso, ingerir alimentos sanos, seguir una dieta baja en sodio, hacer ms ejercicio y Environmental consultant consumo de alcohol. Esta informacin no tiene Marine scientist el consejo del mdico. Asegrese de hacerle al mdico cualquier pregunta que tenga. Document Revised: 06/14/2016 Document Reviewed: 03/17/2016 Elsevier Patient Education  2020 [place] de la sensibilidad despus de un accidente cerebrovascular Sensory Loss After a Stroke Un accidente cerebrovascular puede  daar partes del cerebro que controlan las funciones normales del cuerpo, incluidos los sentidos. Como consecuencia, es posible tener una prdida de la sensibilidad, por ejemplo, tener dificultad para ver, sentir el gusto, tragar, o sentir el tacto o la presin. Es posible tener problemas para sentir los  cambios de Marketing executivetemperatura o mover el cuerpo de Colomanera coordinada. Tambin se pueden McKessonpercibir los olores de Abbott Laboratoriesmanera diferente. Los problemas pueden presentarse en todos los sentidos o solo en algunos de ellos. Al seguir un plan de tratamiento, se pueden recuperar los sentidos perdidos y Apple Computermanejar los cambios en el estilo de vida. Qu tratamientos existen para la prdida de la sensibilidad? La prdida de la sensibilidad puede tratarse mediante una combinacin de tratamientos.  Realizar fisioterapia. Esto puede incluir lo siguiente: ? Ejercicios para mejorar la coordinacin y el equilibrio. ? Ejercicios para combinar el tacto, el equilibrio y Conservation officer, historic buildingsel movimiento (entrenamiento sensitivomotor). ? Movimientos para Technical sales engineeraliviar la presin al estar sentado o acostado (entrenamiento de la movilidad). ? Frulas u aparatos ortopdicos para proteger las partes del cuerpo que no se sienten.  Dispositivos para el flujo sanguneo (la circulacin) y para Risk managerestimular los nervios en las partes afectadas del cuerpo.  Terapia del habla para ayudarle a tragar con seguridad.  Terapia ocupacional para ayudarle con las tareas cotidianas. Esto puede incluir lo siguiente: ? Ejercicios o dispositivos para mejorar la visin. ? Ejercicios para ayudarle a mejorar su percepcin del tacto. Pueden consistir en sentir objetos de distintos tamaos y texturas Lehman Brothersmanteniendo los ojos cerrados. ? Tcnicas para moverse con seguridad en el ambiente.  Anteojos recetados para la prdida de la visin.  Audfonos para la prdida de la audicin. Siga estas indicaciones en su casa: Seguridad   El riesgo de caerse es mayor despus de un accidente cerebrovascular. Es posible tener dificultad para sentir las piernas y los pies o para coordinar los movimientos. Para reducir el riesgo de caerse: ? Use dispositivos de ayuda para trasladarse (dispositivos auxiliares), como una silla de ruedas o un andador, segn las indicaciones de su equipo mdico. ? Use los  anteojos recetados en todo momento cuando se traslade de Administratorun lugar a otro. ? Use las luces para ver en la oscuridad. ? Coloque barras para sostn en los baos y KeyCorppasamanos en las escaleras. ? Quite alfombras, cables y cosas desordenadas del piso para mantener despejados los lugares de trnsito de Hotel managersu hogar.  El riesgo de quemarse es mayor despus de un accidente cerebrovascular. Para disminuir el riesgo de quemaduras: ? Pruebe la temperatura del agua antes de tomar un bao o lavarse las manos. ? Deje enfriar un poco los alimentos calientes antes de comerlos. ? Use agarraderas para las ollas calientes.  Cuando utilice objetos cortantes, como tijeras y cuchillos, manjelos con la mano sana. No maneje los objetos cortantes con la mano que result afectada por el accidente cerebrovascular. Actividad  Retome sus actividades normales como se lo haya indicado el mdico. Pregntele al mdico qu actividades son seguras para usted.  Evite pasar demasiado tiempo sentado o acostado. Si tiene la necesidad de estar en una silla o en la cama, cambie de posicin con frecuencia.  Es posible que tenga problemas para Education officer, environmentalrealizar las actividades cotidianas. Pregntele al mdico sobre la posibilidad de conseguir ayuda extra Facilities manageren el hogar. Comida y bebida   Es posible que tenga problemas para tragar los alimentos y los lquidos despus de un accidente cerebrovascular. Los problemas pueden deberse a lo siguiente: ? USAACambios en los msculos. ? Cambios en la sensibilidad,  tales como:  Dificultad para sentir la consistencia o el tamao de un trozo de comida en la boca.  Incapacidad de sentir la necesidad de Forensic psychologist.  Puede ser necesario hacer lo siguiente: ? Comer bocados pequeos y [address] Asegurarse de haber tragado toda la comida que tiene en la boca antes de tomar otro bocado. ? Sintese en posicin erguida al comer o beber. ? Evite las distracciones mientras coma o beba. ? Qudese en  posicin erguida entre 30y despus de comer. ? Cambie la textura de algunos alimentos y bebidas. Esto puede incluir lo siguiente:  Dar a los alimentos una consistencia ms suave y Human resources officer (pur).  Espesar los lquidos.  Siga las indicaciones del mdico respecto de las restricciones para las comidas y las bebidas. Instrucciones generales  Utilice aparatos ortopdicos en los brazos o las piernas segn las indicaciones del equipo mdico.  Pida ayuda en el hogar segn sea necesario. Es posible que necesite ayuda para vestirse, baarse, usar el bao, comer o Event organiser.  Concurra a todas las visitas de control como se lo hayan indicado los mdicos. Esto es importante. Resumen  Es habitual tener prdida de la sensibilidad despus de un accidente cerebrovascular. La prdida de la sensibilidad implica tener problemas con los sentidos de la vista, el gusto, el odo, el olfato y el tacto. Esos problemas pueden presentarse en todos los sentidos o en algunos.  La prdida de la sensibilidad ocurre debido al dao que el cerebro y el sistema nervioso sufren despus de un accidente cerebrovascular.  La prdida de la sensibilidad puede tratarse con fisioterapia, terapia ocupacional o terapia del habla, adems del uso de dispositivos auxiliares.  Posiblemente deba hacer cambios en su hogar y en su estilo de vida despus de un accidente cerebrovascular para vivir de manera segura e independiente. Esta informacin no tiene Theme park manager el consejo del mdico. Asegrese de hacerle al mdico cualquier pregunta que tenga. Document Revised: 12/29/2016 Document Reviewed: 12/29/2016 Elsevier Patient Education  2020 Elsevier Inc.  Radiculopata cervical Cervical Radiculopathy  La radiculopata cervical se presenta cuando un nervio del cuello (un nervio cervical) est comprimido o daado. Esta afeccin puede ocurrir debido a una lesin en la columna vertebral cervical (vrtebras)  del cuello, o como parte del proceso de envejecimiento normal. La compresin de los nervios cervicales puede causar dolor o adormecimiento que se extiende desde el cuello hasta el brazo y los dedos de la [place]. Generalmente, esta afeccin mejora con reposo. Si no mejora, tal vez sea necesario administrar un tratamiento. Cules son las causas? Esta afeccin puede ser causada por lo siguiente:  Lesin en el cuello.  Un abombamiento (hernia) discal.  Espasmos musculares.  Rigidez en el cuello debido al [employer].  Artritis.  Fractura o degeneracin de los huesos y las articulaciones de la columna (espondiloartrosis) debido al envejecimiento.  Espolones seos que pueden formarse cerca de los nervios cervicales. Cules son los signos o los sntomas? Los sntomas de esta afeccin incluyen:  Engineer, mining. El dolor puede extenderse desde el cuello hasta el brazo y [place]. El dolor puede ser intenso o [place]. Puede empeorar al mover el cuello.  Adormecimiento u hormigueo en el brazo o la mano.  Debilidad en el brazo y la mano afectados, en casos graves. Cmo se diagnostica? Esta afeccin se puede diagnosticar en funcin de los sntomas, la historia clnica y los antecedentes mdicos. Tambin pueden hacerle estudios, que incluyen los siguientes:  Radiografas.  Una exploracin por  tomografa computarizada (TC).  Una resonancia magntica (RM).  Un electromiograma (EMG).  Pruebas de conduccin nerviosa. Cmo se trata? En muchos casos, no se requiere tratamiento para esta afeccin. Con reposo, esta suele mejorar con [employer]. Si es Publishing rights manager, las opciones pueden incluir lo siguiente:  El uso de un collarn cervical blando (collarn cervical) durante perodos cortos, como se lo haya indicado el mdico.  Hacer fisioterapia para fortalecer los msculos del cuello.  Tomar medicamentos, como antiinflamatorios no esteroideos (AINE) o corticoesteroides por va  oral.  Aplicarse inyecciones en la columna vertebral, en los casos graves.  Someterse a [place]. Esto puede ser necesario si otros tratamientos no son eficaces. Segn la causa de esta afeccin, podrn implementarse diferentes tipos de [place]. Siga estas instrucciones en su casa: Si tiene un collarn cervical:  selo como se lo haya indicado el mdico. Quteselo solamente como se lo haya indicado el mdico.  Pregntele al mdico si puede quitarse el collarn para baarse e higienizarse. Si lo autorizan a Warehouse manager para baarse o higienizarse: ? Siga las instrucciones del mdico acerca de cmo quitarse el collarn de manera segura. ? Para limpiar el collarn, psele un pao con agua y [place], y squelo bien. ? Quite las almohadillas desmontables del collarn, si las tiene, cada 1 o 2das y [facility] a [place] con agua y [place]. Djelas que se sequen por completo antes de volver a ponerlas en el collarn. ? Contrlese la piel debajo del collarn para ver si hay irritacin o llagas. Si presenta alguna de estas, informe a su mdico. Control del [employer] de venta libre y los recetados solamente como se lo haya indicado el mdico.  Si se lo indican, aplique hielo sobre la zona afectada. ? Si tiene un collarn cervical blando, quteselo como se lo haya indicado el mdico. ? Ponga el hielo en una bolsa plstica. ? Coloque una [employer] piel y Copy. ? Coloque el hielo durante , 2 a 3veces por da.  Si aplicarse hielo no le Research scientist (life sciences), intente Company secretary. Use la fuente de calor que el mdico le recomiende, como una compresa de calor hmedo o una almohadilla trmica. ? Coloque una [employer] piel y la fuente de Airline pilot. ? Aplique calor durante 20 a . ? Retire la fuente de calor si la piel se pone de color rojo brillante. Esto es especialmente importante si no puede sentir dolor, calor o fro. Puede correr un riesgo  mayor de sufrir quemaduras.  Intente darse un masaje suave en el cuello y el hombro para ayudar a Paramedic los sntomas. Actividad  Descanse todo lo que sea necesario.  Retome sus actividades normales segn lo indicado por el mdico. Pregntele al mdico qu actividades son seguras para usted.  Realice ejercicios de elongacin y fortalecimiento como se lo hayan indicado el mdico o el fisioterapeuta.  No levante objetos que pesen ms de 10libras (4.5kg) hasta que el mdico le diga que es seguro. Instrucciones generales  Use una almohada plana para dormir.  No conduzca mientras [place] un collarn cervical. Si no tiene un collarn cervical, pregntele al mdico si es seguro que conduzca durante el proceso de curacin del cuello.  Pregntele al mdico si el medicamento recetado le impide conducir o usar maquinaria pesada.  No consuma ningn producto que contenga nicotina o tabaco, como cigarrillos, cigarrillos electrnicos y tabaco de Theatre manager. Estos pueden retrasar la recuperacin. Si necesita  ayuda para dejar de fumar, consulte al mdico.  Concurra a todas las visitas de seguimiento como se lo haya indicado el mdico. Esto es importante. Comunquese con un mdico si:  La afeccin no mejora con tratamiento. Solicite ayuda inmediatamente si:  El dolor se intensifica y no se Chief Executive Officer con los medicamentos.  Siente debilidad o adormecimiento en la mano, el brazo, el rostro o la pierna.  Tiene fiebre alta.  Tiene rigidez de cuello.  Pierde el control de la vejiga o los intestinos (tiene incontinencia).  Tiene dificultad para caminar, mantener el equilibrio o hablar. Resumen  La radiculopata cervical se presenta cuando un nervio del cuello est comprimido o daado.  Un nervio puede pinzarse por un abultamiento discal, artritis, espasmos musculares o una lesin en el cuello.  Los sntomas Environmental education officer, hormigueo o adormecimiento que se irradia desde el cuello hacia el brazo o la  [place]. En los casos graves, tambin puede presentarse debilidad.  El tratamiento puede incluir reposo, fisioterapia y usar un collarn cervical. Pueden recetarle medicamentos para Engineer, materials. En casos graves, tal vez haya que aplicar inyecciones o realizar [place]. Esta informacin no tiene Theme park manager el consejo del mdico. Asegrese de hacerle al mdico cualquier pregunta que tenga. Document Revised: 04/20/2018 Document Reviewed: 04/20/2018 Elsevier Patient Education  2020 [employer].

## 2019-08-02 NOTE — Progress Notes (Signed)
[facility] Patient [facility] [address] [place], [place]  [address] Phone:  [phone]   Fax:  [phone]   Established Patient Office Visit  Subjective:  Patient ID: Mark Robles, male    DOB: April 06, 1951  Age: 69 y.o. MRN: [id number]  CC:  Chief Complaint  Patient presents with  . Follow-up    HTN    HPI Mark Robles presents for follow-up. He  has a past medical history of Depression, DVT (deep venous thrombosis) (HCC), Hyperlipidemia, Left-sided sensory deficit present, Seizure (HCC), and Stroke (HCC).   He presents for follow up of a stroke. Event occurred several Years ago. He has residual symptoms of gait disturbance and paralysis of Left upper extremity. He denies cognitive impairment, seizures, slurred speech and swallowing difficulty. Overall he feels his condition is unchanged. Stroke risk factors include: hypercoagulable state, hyperlipidemia and hypertension.  He denies aphasia, confusion, difficulty swallowing, difficulty understanding, facial droop, headache, lethargy and slurred speech.   Hypertension Patient is here for follow-up of elevated blood pressure. He is exercising and is adherent to a low-salt diet. Blood pressure is well controlled at home. Cardiac symptoms: none. Patient denies chest pain, dyspnea, irregular heart beat, lower extremity edema, palpitations and syncope. Cardiovascular risk factors: advanced age (older than 14 for men, 35 for women), dyslipidemia, hypertension, male gender, obesity (BMI >= 30 kg/m2) and sedentary lifestyle. Use of agents associated with hypertension: none. History of target organ damage: stroke.  Anticoagulation Patient is here for anticoagulation follow-up. Indication: CVA and DVT Bleeding Signs/Symptoms:  None Thromboembolic Signs/Symptoms:  None  He has heaviness in the back of head. He has a lot of dizziness. He has had a fall but no injury. His last fall was one month ago. The  heaviness is has been going on but now it is worse. He denies new strokelike symptoms. He has pain in the neck but not into the shoulders. The pain is just heavy in the neck. He has left sided weakness but denies any changes there.  He is taking all his medications as directed.  He admits that he is sleeping well and that his mood has improved.  Past Medical History:  Diagnosis Date  . Depression   . DVT (deep venous thrombosis) (HCC)   . Hyperlipidemia   . Left-sided sensory deficit present   . Seizure (HCC)   . Stroke [facility])     Past Surgical History:  Procedure Laterality Date  . NO PAST SURGERIES      Family History  Problem Relation Age of Onset  . Hypertension Mother        tachycardia, pacemaker  . Hypertension Sister   . Hypertension Brother   . Hypertension Maternal Uncle     Social History   Socioeconomic History  . Marital status: Married    Spouse name: Not on file  . Number of children: 3  . Years of education: 6th grade  . Highest education level: Not on file  Occupational History  . Not on file  Tobacco Use  . Smoking status: Never Smoker  . Smokeless tobacco: Never Used  Substance and Sexual Activity  . Alcohol use: No  . Drug use: No  . Sexual activity: Not Currently  Other Topics Concern  . Not on file  Social History Narrative   Lives at home with wife & 1 child   Right handed   No caffeine    Social Determinants of Health   Financial Resource Strain:   .  Difficulty of Paying Living Expenses:   Food Insecurity:   . Worried About Programme researcher, broadcasting/film/video in the Last Year:   . Barista in the Last Year:   Transportation Needs:   . Freight forwarder (Medical):   Marland Kitchen Lack of Transportation (Non-Medical):   Physical Activity:   . Days of Exercise per Week:   . Minutes of Exercise per Session:   Stress:   . Feeling of Stress :   Social Connections:   . Frequency of Communication with Friends and Family:   . Frequency of Social  Gatherings with Friends and Family:   . Attends Religious Services:   . Active Member of Clubs or Organizations:   . Attends Banker Meetings:   Marland Kitchen Marital Status:   Intimate Partner Violence:   . Fear of Current or Ex-Partner:   . Emotionally Abused:   Marland Kitchen Physically Abused:   . Sexually Abused:     Outpatient Medications Prior to Visit  Medication Sig Dispense Refill  . clopidogrel (PLAVIX) 75 MG tablet Take 1 tablet (75 mg total) by mouth daily. 30 tablet 11  . famotidine (PEPCID) 20 MG tablet Take 1 tablet (20 mg total) by mouth 2 (two) times daily. 60 tablet 2  . FLUoxetine (PROZAC) 20 MG tablet TOME 1 PASTILLA POR VIA ORAL CADA MANANA 30 tablet 3  . fluticasone (FLONASE) 50 MCG/ACT nasal spray Place 2 sprays into both nostrils daily. 16 g 6  . hydrochlorothiazide (HYDRODIURIL) 12.5 MG tablet Take 1 tablet (12.5 mg total) by mouth daily. 30 tablet 3  . hydrOXYzine (ATARAX/VISTARIL) 10 MG tablet Take 1 tablet three times daily as needed. 30 tablet 3  . meclizine (ANTIVERT) 25 MG tablet TAKE 1 TABLET BY MOUTH 3 TIMES DAILY AS NEEDED FOR DIZZINESS. 60 tablet 0  . meclizine (ANTIVERT) 25 MG tablet TAKE 1 TABLET BY MOUTH 3 TIMES DAILY AS NEEDED FOR DIZZINESS. 60 tablet 3  . Olopatadine HCl 0.2 % SOLN APPLY 1 DROP TO EYE DAILY AS NEEDED (ITCHING EYES). 2.5 mL 0  . rosuvastatin (CRESTOR) 20 MG tablet Take 1 tablet (20 mg total) by mouth daily. 30 tablet 3  . traZODone (DESYREL) 50 MG tablet Take 0.5-1 tablets (25-50 mg total) by mouth at bedtime as needed for sleep. 30 tablet 3  . loperamide (IMODIUM) 2 MG capsule TAKE 2 CAPSULES BY MOUTH INITIALLY THEN 1 CAPSULE AFTER EACH LOOSE STOOL (DO NOT EXCEED 8 CAPSULES IN 24 HOURS)  0  . rosuvastatin (CRESTOR) 20 MG tablet TAKE 1 TABLET (20 MG TOTAL) BY MOUTH DAILY. 30 tablet 0   No facility-administered medications prior to visit.    Allergies  Allergen Reactions  . Amlodipine Itching and Swelling    ROS Review of Systems    Constitutional: Negative.   HENT: Negative.   Eyes: Negative.   Respiratory: Negative.   Cardiovascular: Negative.   Gastrointestinal: Negative.   Endocrine:       Cold sensitivity   Genitourinary: Negative.   Musculoskeletal: Positive for neck pain.  Skin: Negative.   Allergic/Immunologic: Negative.   Neurological: Positive for dizziness.  Hematological: Negative.       Objective:    Physical Exam  Constitutional: He is oriented to person, place, and time. He appears well-developed and well-nourished.  HENT:  Head: Normocephalic and atraumatic.  Eyes: Pupils are equal, round, and reactive to light.  Neck:  Mild tenderness with palplation to nap of neck  Pulmonary/Chest: Effort normal and breath sounds  normal.  Abdominal: Soft. Bowel sounds are normal.  Musculoskeletal:        General: Edema present.     Cervical back: Normal range of motion and neck supple.     Comments: Left sided hemiparesis   Neurological: He is alert and oriented to person, place, and time. He has normal reflexes.  Skin: Skin is warm and dry.  Psychiatric: He has a normal mood and affect. His behavior is normal. Judgment and thought content normal.    BP 134/86   Pulse 60   Temp 98.9 F (37.2 C) (Oral)   Ht  (1.626 m)   Wt 180 lb 9.6 oz (81.9 kg)   SpO2 97%   BMI 31.00 kg/m  Wt Readings from Last 3 Encounters:  08/02/19 180 lb 9.6 oz (81.9 kg)  05/04/19 182 lb (82.6 kg)  10/13/18 169 lb (76.7 kg)     Health Maintenance Due  Topic Date Due  . Hepatitis C Screening  Never done  . COLONOSCOPY  Never done    There are no preventive care reminders to display for this patient.  No results found for: TSH Lab Results  Component Value Date   WBC 5.6 08/16/2018   HGB 14.3 08/16/2018   HCT 43.8 08/16/2018   MCV 92.6 08/16/2018   PLT 359 08/16/2018   Lab Results  Component Value Date   NA 137 05/04/2019   K 4.4 05/04/2019   CO2 23 10/13/2018   GLUCOSE 93 05/04/2019   BUN  11 05/04/2019   CREATININE 0.93 05/04/2019   BILITOT 0.3 05/04/2019   ALKPHOS 44 05/04/2019   AST 26 05/04/2019   ALT 25 10/13/2018   PROT 6.7 05/04/2019   ALBUMIN 4.6 05/04/2019   CALCIUM 9.6 05/04/2019   ANIONGAP 7 08/16/2018   Lab Results  Component Value Date   CHOL 195 05/04/2019   Lab Results  Component Value Date   HDL 57 05/04/2019   Lab Results  Component Value Date   LDLCALC 108 (H) 05/04/2019   Lab Results  Component Value Date   TRIG 176 (H) 05/04/2019   Lab Results  Component Value Date   CHOLHDL 3.4 05/04/2019   Lab Results  Component Value Date   HGBA1C 5.4 05/16/2017      Assessment & Plan:   Assessment  Primary Diagnosis & Pertinent Problem List: The primary encounter diagnosis was Health care maintenance. Diagnoses of Anticoagulant long-term use, Adjustment insomnia, Essential hypertension, Cerebrovascular accident (CVA), unspecified mechanism (HCC), Dyslipidemia, Healthcare maintenance, Chronic neck pain, and Current mild episode of major depressive disorder without prior episode [facility]) were also pertinent to this visit.  Visit Diagnosis: 1. Health care maintenance   2. Anticoagulant long-term use   3. Adjustment insomnia   4. Essential hypertension   5. Cerebrovascular accident (CVA), unspecified mechanism (HCC)   6. Dyslipidemia   7. Healthcare maintenance   8. Chronic neck pain   9. Current mild episode of major depressive disorder without prior episode (HCC) Chronic    Plan of Care  Pharmacotherapy (Medications Ordered): No orders of the defined types were placed in this encounter.  New Prescriptions   No medications on file   Medications administered today: Jaylon Boylen had no medications administered during this visit. Lab-work, procedure(s), and/or referral(s): Orders Placed This Encounter  Procedures  . DG Cervical Spine Complete  . TSH  . Lipid panel  . Comp. Metabolic Panel (12)  . CBC with  Differential/Platelet  . POCT urinalysis dipstick  .  POCT glucose (manual entry)  . POCT glycosylated hemoglobin (Hb A1C)    Patient instructions provided during this appointment: Patient Instructions  Cmo controlar su hipertensin Managing Your Hypertension La hipertensin se denomina usualmente presin arterial alta. Ocurre cuando la sangre presiona contra las paredes de las arterias con demasiada fuerza. Las arterias son los vasos sanguneos que transportan la sangre desde el corazn hacia todas las partes del cuerpo. La hipertensin hace que el corazn haga ms esfuerzo para Insurance account managerbombear sangre y Sears Holdings Corporationpuede provocar que las arterias se Armed forces training and education officerestrechen o Multimedia programmerendurezcan. La hipertensin no tratada o no controlada puede causar infarto de miocardio, accidente cerebrovascular, enfermedad renal y otros problemas. Qu son las Merchandiser, retaillecturas de presin arterial? Una lectura de la presin arterial consiste de un nmero ms alto sobre un nmero ms bajo. En condiciones ideales, la presin arterial debe estar por debajo de 120/80. El primer nmero ("superior") es la presin sistlica. Es la medida de la presin de las arterias cuando el corazn late. El segundo nmero ("inferior") es la presin diastlica. Es la medida de la presin en las arterias cuando el corazn se relaja. Qu significa mi lectura de presin arterial? La presin arterial se clasifica en cuatro etapas. Sobre la base de la lectura de su presin arterial, el mdico puede usar las siguientes etapas para determinar si necesita tratamiento y de qu tipo. La presin sistlica y la presin diastlica se miden en una unidad llamada mm Hg. Normal  Presin sistlica: por debajo de 120.  Presin diastlica: por debajo de 80. Elevada  Presin sistlica: 120-129.  Presin diastlica: por debajo de 80. Etapa 1 de hipertensin  Presin sistlica: 130-139.  Presin diastlica: 80-89. Etapa 2 de hipertensin  Presin sistlica: 140 o ms.  Presin diastlica:  90 o ms. Cules son los riesgos para la salud asociados con la hipertensin? Controlar la hipertensin es una responsabilidad importante. La hipertensin no controlada puede causar:  Infarto de miocardio.  Accidente cerebrovascular.  Debilitamiento de los vasos sanguneos (aneurisma).  Insuficiencia cardaca.  Dao renal.  Dao ocular.  Sndrome metablico.  Problemas de memoria y concentracin. Qu cambios puedo hacer para controlar mi hipertensin? La hipertensin se puede controlar haciendo Danaher Corporationcambios en el estilo de vida y, posiblemente, tomando medicamentos. Su mdico le ayudar a crear un plan para bajar la presin arterial al rango normal. Comida y bebida   Siga una dieta con alto contenido de fibras y Keystonepotasio, y con bajo contenido de sal (sodio), azcar agregada y Rosalin Hawkinggrasas. Un ejemplo de plan alimenticio es la dieta DASH (Dietary Approaches to Stop Hypertension, Mtodos alimenticios para detener la hipertensin). Para alimentarse de esta manera: ? Coma mucha fruta y verdura fresca. Trate de que la mitad del plato de cada comida sea de frutas y verduras. ? Coma cereales integrales, como pasta integral, arroz integral y pan integral. Llene aproximadamente un cuarto del plato con cereales integrales. ? Consuma productos lcteos con bajo contenido de grasa. ? Evite la ingesta de cortes de carne grasa, carne procesada o curada, y carne de ave con piel. Llene aproximadamente un cuarto del plato con protenas magras, como pescado, pollo sin piel, frijoles, huevos y tofu. ? Evite ingerir alimentos prehechos o procesados. En general, estos tienen mayor cantidad de sodio, azcar agregada y Steffanie Rainwatergrasa.  Reduzca su ingesta diaria de sodio. La mayora de las personas que tienen hipertensin deben comer menos de 1500 mg de sodio por C.H. Robinson Worldwideda.  Limite el consumo de alcohol a no ms de 1 medida por da si es Intelmujer  y no est [place] y a 2 medidas por da si es hombre. Una medida equivale a 12onzas de  cerveza, 5onzas de vino o 1onzas de bebidas alcohlicas de alta graduacin. Estilo de vida  Trabaje con su mdico para mantener un peso saludable o Curator. Pregntele cual es su peso recomendado.  Realice al menos 30 minutos de ejercicio que haga que se acelere su corazn (ejercicio Magazine features editor) la DIRECTV de la [place]. Estas actividades pueden incluir caminar, nadar o andar en bicicleta.  Incluya ejercicios para fortalecer sus msculos (ejercicios de resistencia), como levantamiento de pesas, como parte de su rutina semanal de ejercicios. Intente realizar de este tipo de ejercicios al Kellogg a la [place].  No consuma ningn producto que contenga nicotina o tabaco, como cigarrillos y Administrator, Civil Service. Si necesita ayuda para dejar de fumar, consulte al American Express.  Controle las enfermedades a largo plazo (crnicas), como el colesterol alto o la diabetes. Control  Contrlese la presin arterial en su casa segn las indicaciones del mdico. La presin arterial deseada puede variar en funcin de las enfermedades, la edad y otros factores personales.  Contrlese la presin arterial de manera regular, en la frecuencia indicada por su mdico. Trabaje con su mdico  Revise con su mdico todos los medicamentos que toma ya que puede haber efectos secundarios o interacciones.  Hable con su mdico acerca de la dieta, hbitos de ejercicio y otros factores del estilo de vida que pueden contribuir a la hipertensin.  Consulte a su mdico regularmente. Su mdico puede ayudarle a crear y [place] su plan para controlar la hipertensin. Debo tomar un medicamento para controlar mi presin arterial? El mdico puede recetarle medicamentos si los cambios en el estilo de vida no son suficientes para Museum/gallery curator la presin arterial y si:  Su presin arterial sistlica es de 130 o ms.  Su presin arterial diastlica es de 80 o ms. Tome los medicamentos solamente  como se lo haya indicado el mdico. Siga cuidadosamente las indicaciones. Los medicamentos para la presin arterial deben tomarse segn las indicaciones. Los medicamentos pierden eficacia al omitir las dosis. El hecho de omitir las dosis tambin [place] el riesgo de otros problemas. Comunquese con un mdico si:  Piensa que tiene Runner, broadcasting/film/video a los medicamentos que ha tomado.  Tiene dolores de cabeza frecuentes (recurrentes).  Siente mareos.  Tiene hinchazn en los tobillos.  Tiene problemas de visin. Solicite ayuda de inmediato si:  Siente un dolor de cabeza intenso o confusin.  Siente debilidad inusual, adormecimiento o que Hospital doctor.  Siente un dolor intenso en el pecho o el abdomen.  Vomita repetidas veces.  Tiene dificultad para respirar. Resumen  La hipertensin se produce cuando la sangre bombea en las arterias con mucha fuerza. Si esta afeccin no se controla, podra correr riesgo de tener complicaciones graves.  La presin arterial deseada puede variar en funcin de las enfermedades, la edad y otros factores personales. Para la [employer], una presin arterial normal es menor que 120/80.  La hipertensin se puede controlar mediante cambios en el estilo de vida, tomando medicamentos, o ambas cosas. Los Danaher Corporation estilo de vida incluyen prdida de peso, ingerir alimentos sanos, seguir una dieta baja en sodio, hacer ms ejercicio y Glass blower/designer consumo de alcohol. Esta informacin no tiene Theme park manager el consejo del mdico. Asegrese de hacerle al mdico cualquier pregunta que tenga. Document Revised: 06/14/2016 Document Reviewed: 03/17/2016 Elsevier Patient Education  2020 Elsevier Inc.   Prdida de la sensibilidad despus de un accidente cerebrovascular Sensory Loss After a Stroke Un accidente cerebrovascular puede daar partes del cerebro que controlan las funciones normales del cuerpo, incluidos los sentidos. Como consecuencia, es  posible tener una prdida de la sensibilidad, por ejemplo, tener dificultad para ver, sentir el gusto, tragar, o sentir el tacto o la presin. Es posible tener problemas para sentir los cambios de Marketing executive o mover el cuerpo de [place] coordinada. Tambin se pueden McKesson de Abbott Laboratories. Los problemas pueden presentarse en todos los sentidos o solo en algunos de ellos. Al seguir un plan de tratamiento, se pueden recuperar los sentidos perdidos y Apple Computer cambios en el estilo de vida. Qu tratamientos existen para la prdida de la sensibilidad? La prdida de la sensibilidad puede tratarse mediante una combinacin de tratamientos.  Realizar fisioterapia. Esto puede incluir lo siguiente: ? Ejercicios para mejorar la coordinacin y el equilibrio. ? Ejercicios para combinar el tacto, el equilibrio y Conservation officer, historic buildings (entrenamiento sensitivomotor). ? Movimientos para Technical sales engineer presin al estar sentado o acostado (entrenamiento de la movilidad). ? Frulas u aparatos ortopdicos para proteger las partes del cuerpo que no se sienten.  Dispositivos para el flujo sanguneo (la circulacin) y para Risk manager los nervios en las partes afectadas del cuerpo.  Terapia del habla para ayudarle a tragar con seguridad.  Terapia ocupacional para ayudarle con las tareas cotidianas. Esto puede incluir lo siguiente: ? Ejercicios o dispositivos para mejorar la visin. ? Ejercicios para ayudarle a mejorar su percepcin del tacto. Pueden consistir en sentir objetos de distintos tamaos y texturas Lehman Brothers ojos cerrados. ? Tcnicas para moverse con seguridad en el ambiente.  Anteojos recetados para la prdida de la visin.  Audfonos para la prdida de la audicin. Siga estas indicaciones en su casa: Seguridad   El riesgo de caerse es mayor despus de un accidente cerebrovascular. Es posible tener dificultad para sentir las piernas y los pies o para coordinar los movimientos. Para reducir  el riesgo de caerse: ? Use dispositivos de ayuda para trasladarse (dispositivos auxiliares), como una silla de ruedas o un andador, segn las indicaciones de su equipo mdico. ? Use los anteojos recetados en todo momento cuando se traslade de Administrator a otro. ? Use las luces para ver en la oscuridad. ? Coloque barras para sostn en los baos y KeyCorp en las escaleras. ? Quite alfombras, cables y cosas desordenadas del piso para mantener despejados los lugares de trnsito de Hotel manager.  El riesgo de quemarse es mayor despus de un accidente cerebrovascular. Para disminuir el riesgo de quemaduras: ? Pruebe la temperatura del agua antes de tomar un bao o lavarse las manos. ? Deje enfriar un poco los alimentos calientes antes de comerlos. ? Use agarraderas para las ollas calientes.  Cuando utilice objetos cortantes, como tijeras y cuchillos, manjelos con la mano sana. No maneje los objetos cortantes con la mano que result afectada por el accidente cerebrovascular. Actividad  Retome sus actividades normales como se lo haya indicado el mdico. Pregntele al mdico qu actividades son seguras para usted.  Evite pasar demasiado tiempo sentado o acostado. Si tiene la necesidad de estar en una silla o en la cama, cambie de posicin con frecuencia.  Es posible que tenga problemas para Education officer, environmental las actividades cotidianas. Pregntele al mdico sobre la posibilidad de conseguir ayuda extra Facilities manager. Comida y bebida   Es posible que tenga problemas para tragar los alimentos y los  lquidos despus de un accidente cerebrovascular. Los problemas pueden deberse a lo siguiente: ? USAA. ? Cambios en la sensibilidad, tales como:  Dificultad para sentir la consistencia o el tamao de un trozo de comida en la boca.  Incapacidad de sentir la necesidad de Forensic psychologist.  Puede ser necesario hacer lo siguiente: ? Comer bocados pequeos y [address] Asegurarse de  haber tragado toda la comida que tiene en la boca antes de tomar otro bocado. ? Sintese en posicin erguida al comer o beber. ? Evite las distracciones mientras coma o beba. ? Qudese en posicin erguida entre 30y despus de comer. ? Cambie la textura de algunos alimentos y bebidas. Esto puede incluir lo siguiente:  Dar a los alimentos una consistencia ms suave y Human resources officer (pur).  Espesar los lquidos.  Siga las indicaciones del mdico respecto de las restricciones para las comidas y las bebidas. Instrucciones generales  Utilice aparatos ortopdicos en los brazos o las piernas segn las indicaciones del equipo mdico.  Pida ayuda en el hogar segn sea necesario. Es posible que necesite ayuda para vestirse, baarse, usar el bao, comer o Event organiser.  Concurra a todas las visitas de control como se lo hayan indicado los mdicos. Esto es importante. Resumen  Es habitual tener prdida de la sensibilidad despus de un accidente cerebrovascular. La prdida de la sensibilidad implica tener problemas con los sentidos de la vista, el gusto, el odo, el olfato y el tacto. Esos problemas pueden presentarse en todos los sentidos o en algunos.  La prdida de la sensibilidad ocurre debido al dao que el cerebro y el sistema nervioso sufren despus de un accidente cerebrovascular.  La prdida de la sensibilidad puede tratarse con fisioterapia, terapia ocupacional o terapia del habla, adems del uso de dispositivos auxiliares.  Posiblemente deba hacer cambios en su hogar y en su estilo de vida despus de un accidente cerebrovascular para vivir de manera segura e independiente. Esta informacin no tiene Theme park manager el consejo del mdico. Asegrese de hacerle al mdico cualquier pregunta que tenga. Document Revised: 12/29/2016 Document Reviewed: 12/29/2016 Elsevier Patient Education  2020 Elsevier Inc.  Radiculopata cervical Cervical Radiculopathy  La  radiculopata cervical se presenta cuando un nervio del cuello (un nervio cervical) est comprimido o daado. Esta afeccin puede ocurrir debido a una lesin en la columna vertebral cervical (vrtebras) del cuello, o como parte del proceso de envejecimiento normal. La compresin de los nervios cervicales puede causar dolor o adormecimiento que se extiende desde el cuello hasta el brazo y los dedos de la [place]. Generalmente, esta afeccin mejora con reposo. Si no mejora, tal vez sea necesario administrar un tratamiento. Cules son las causas? Esta afeccin puede ser causada por lo siguiente:  Lesin en el cuello.  Un abombamiento (hernia) discal.  Espasmos musculares.  Rigidez en el cuello debido al [employer].  Artritis.  Fractura o degeneracin de los huesos y las articulaciones de la columna (espondiloartrosis) debido al envejecimiento.  Espolones seos que pueden formarse cerca de los nervios cervicales. Cules son los signos o los sntomas? Los sntomas de esta afeccin incluyen:  Engineer, mining. El dolor puede extenderse desde el cuello hasta el brazo y [place]. El dolor puede ser intenso o [place]. Puede empeorar al mover el cuello.  Adormecimiento u hormigueo en el brazo o la mano.  Debilidad en el brazo y la mano afectados, en casos graves. Cmo se diagnostica? Esta afeccin se puede diagnosticar en funcin de  los sntomas, la historia clnica y los antecedentes mdicos. Tambin pueden hacerle estudios, que incluyen los siguientes:  Radiografas.  Una exploracin por tomografa computarizada (TC).  Una resonancia magntica (RM).  Un electromiograma (EMG).  Pruebas de conduccin nerviosa. Cmo se trata? En muchos casos, no se requiere tratamiento para esta afeccin. Con reposo, esta suele mejorar con [employer]. Si es Publishing rights manager, las opciones pueden incluir lo siguiente:  El uso de un collarn cervical blando (collarn cervical) durante perodos  cortos, como se lo haya indicado el mdico.  Hacer fisioterapia para fortalecer los msculos del cuello.  Tomar medicamentos, como antiinflamatorios no esteroideos (AINE) o corticoesteroides por va oral.  Aplicarse inyecciones en la columna vertebral, en los casos graves.  Someterse a [place]. Esto puede ser necesario si otros tratamientos no son eficaces. Segn la causa de esta afeccin, podrn implementarse diferentes tipos de [place]. Siga estas instrucciones en su casa: Si tiene un collarn cervical:  selo como se lo haya indicado el mdico. Quteselo solamente como se lo haya indicado el mdico.  Pregntele al mdico si puede quitarse el collarn para baarse e higienizarse. Si lo autorizan a Warehouse manager para baarse o higienizarse: ? Siga las instrucciones del mdico acerca de cmo quitarse el collarn de manera segura. ? Para limpiar el collarn, psele un pao con agua y [place], y squelo bien. ? Quite las almohadillas desmontables del collarn, si las tiene, cada 1 o 2das y [facility] a [place] con agua y [place]. Djelas que se sequen por completo antes de volver a ponerlas en el collarn. ? Contrlese la piel debajo del collarn para ver si hay irritacin o llagas. Si presenta alguna de estas, informe a su mdico. Control del [employer] de venta libre y los recetados solamente como se lo haya indicado el mdico.  Si se lo indican, aplique hielo sobre la zona afectada. ? Si tiene un collarn cervical blando, quteselo como se lo haya indicado el mdico. ? Ponga el hielo en una bolsa plstica. ? Coloque una [employer] piel y Copy. ? Coloque el hielo durante , 2 a 3veces por da.  Si aplicarse hielo no le Research scientist (life sciences), intente Company secretary. Use la fuente de calor que el mdico le recomiende, como una compresa de calor hmedo o una almohadilla trmica. ? Coloque una [employer] piel y la fuente de  Airline pilot. ? Aplique calor durante 20 a . ? Retire la fuente de calor si la piel se pone de color rojo brillante. Esto es especialmente importante si no puede sentir dolor, calor o fro. Puede correr un riesgo mayor de sufrir quemaduras.  Intente darse un masaje suave en el cuello y el hombro para ayudar a Paramedic los sntomas. Actividad  Descanse todo lo que sea necesario.  Retome sus actividades normales segn lo indicado por el mdico. Pregntele al mdico qu actividades son seguras para usted.  Realice ejercicios de elongacin y fortalecimiento como se lo hayan indicado el mdico o el fisioterapeuta.  No levante objetos que pesen ms de 10libras (4.5kg) hasta que el mdico le diga que es seguro. Instrucciones generales  Use una almohada plana para dormir.  No conduzca mientras [place] un collarn cervical. Si no tiene un collarn cervical, pregntele al mdico si es seguro que conduzca durante el proceso de curacin del cuello.  Pregntele al mdico si el medicamento recetado le impide conducir o usar maquinaria pesada.  No  consuma ningn producto que contenga nicotina o tabaco, como cigarrillos, cigarrillos electrnicos y tabaco de Higher education careers adviser. Estos pueden retrasar la recuperacin. Si necesita ayuda para dejar de fumar, consulte al mdico.  Concurra a todas las visitas de seguimiento como se lo haya indicado el mdico. Esto es importante. Comunquese con un mdico si:  La afeccin no mejora con tratamiento. Solicite ayuda inmediatamente si:  El dolor se intensifica y no se Engineer, production con los medicamentos.  Siente debilidad o adormecimiento en la mano, el brazo, el rostro o la pierna.  Tiene fiebre alta.  Tiene rigidez de cuello.  Pierde el control de la vejiga o los intestinos (tiene incontinencia).  Tiene dificultad para caminar, mantener el equilibrio o hablar. Resumen  La radiculopata cervical se presenta cuando un nervio del cuello est comprimido o daado.  Un  nervio puede pinzarse por un abultamiento discal, artritis, espasmos musculares o una lesin en el cuello.  Los sntomas Research scientist (physical sciences), hormigueo o adormecimiento que se irradia desde el cuello hacia el brazo o la [place]. En los casos graves, tambin puede presentarse debilidad.  El tratamiento puede incluir reposo, fisioterapia y usar un collarn cervical. Pueden recetarle medicamentos para Best boy. En casos graves, tal vez haya que aplicar inyecciones o realizar [place]. Esta informacin no tiene Marine scientist el consejo del mdico. Asegrese de hacerle al mdico cualquier pregunta que tenga. Document Revised: 04/20/2018 Document Reviewed: 04/20/2018 Elsevier Patient Education  [place].      No orders of the defined types were placed in this encounter.   Follow-up: No follow-ups on file.    Vevelyn Francois, NP

## 2019-08-03 LAB — CBC WITH DIFFERENTIAL/PLATELET
Basophils Absolute: 0 10*3/uL (ref 0.0–0.2)
Basos: 1 %
EOS (ABSOLUTE): 0.1 10*3/uL (ref 0.0–0.4)
Eos: 2 %
Hematocrit: 49.7 % (ref 37.5–51.0)
Hemoglobin: 16.5 g/dL (ref 13.0–17.7)
Immature Grans (Abs): 0 10*3/uL (ref 0.0–0.1)
Immature Granulocytes: 0 %
Lymphocytes Absolute: 1.7 10*3/uL (ref 0.7–3.1)
Lymphs: 43 %
MCH: 32.2 pg (ref 26.6–33.0)
MCHC: 33.2 g/dL (ref 31.5–35.7)
MCV: 97 fL (ref 79–97)
Monocytes Absolute: 0.4 10*3/uL (ref 0.1–0.9)
Monocytes: 9 %
Neutrophils Absolute: 1.8 10*3/uL (ref 1.4–7.0)
Neutrophils: 45 %
Platelets: 219 10*3/uL (ref 150–450)
RBC: 5.13 x10E6/uL (ref 4.14–5.80)
RDW: 13 % (ref 11.6–15.4)
WBC: 4 10*3/uL (ref 3.4–10.8)

## 2019-08-03 LAB — LIPID PANEL
Chol/HDL Ratio: 2.6 ratio (ref 0.0–5.0)
Cholesterol, Total: 157 mg/dL (ref 100–199)
HDL: 61 mg/dL (ref >39)
LDL Chol Calc (NIH): 76 mg/dL (ref 0–99)
Triglycerides: 112 mg/dL (ref 0–149)
VLDL Cholesterol Cal: 20 mg/dL (ref 5–40)

## 2019-08-03 LAB — COMP. METABOLIC PANEL (12)
AST: 28 IU/L (ref 0–40)
Albumin/Globulin Ratio: 2 (ref 1.2–2.2)
Albumin: 4.9 g/dL — ABNORMAL HIGH (ref 3.8–4.8)
Alkaline Phosphatase: 45 IU/L (ref 39–117)
BUN/Creatinine Ratio: 15 (ref 10–24)
BUN: 16 mg/dL (ref 8–27)
Bilirubin Total: 0.5 mg/dL (ref 0.0–1.2)
Calcium: 9.9 mg/dL (ref 8.6–10.2)
Chloride: 100 mmol/L (ref 96–106)
Creatinine, Ser: 1.08 mg/dL (ref 0.76–1.27)
GFR calc Af Amer: 82 mL/min/{1.73_m2} (ref >59)
GFR calc non Af Amer: 71 mL/min/{1.73_m2} (ref >59)
Globulin, Total: 2.4 g/dL (ref 1.5–4.5)
Glucose: 80 mg/dL (ref 65–99)
Potassium: 3.9 mmol/L (ref 3.5–5.2)
Sodium: 141 mmol/L (ref 134–144)
Total Protein: 7.3 g/dL (ref 6.0–8.5)

## 2019-08-03 LAB — TSH: TSH: 4.55 u[IU]/mL — ABNORMAL HIGH (ref 0.450–4.500)

## 2019-08-04 ENCOUNTER — Ambulatory Visit: Payer: Self-pay

## 2019-08-06 ENCOUNTER — Other Ambulatory Visit: Payer: Self-pay | Admitting: Nurse Practitioner

## 2019-08-06 ENCOUNTER — Telehealth: Payer: Self-pay

## 2019-08-06 ENCOUNTER — Other Ambulatory Visit: Payer: Self-pay

## 2019-08-06 ENCOUNTER — Encounter: Payer: Self-pay | Admitting: Nurse Practitioner

## 2019-08-06 DIAGNOSIS — R7989 Other specified abnormal findings of blood chemistry: Secondary | ICD-10-CM

## 2019-08-06 NOTE — Telephone Encounter (Signed)
-----   Message from Barbette Merino, NP sent at 08/06/2019  1:35 PM EDT ----- Please [name] know that he needs cervical spine x-ray does show degenerative changes that are slightly worse at C5-6 there is some spurring.  We can refer him if you would like further evaluation and treatment to orthopedist for pain management.

## 2019-08-06 NOTE — Addendum Note (Signed)
Addended by: Barbette Merino on: 08/06/2019 02:44 PM   Modules accepted: Orders

## 2019-08-06 NOTE — Telephone Encounter (Signed)
Called and informed patient that labs are stable and we will recheck in 3 months. Thanks!

## 2019-08-06 NOTE — Telephone Encounter (Signed)
-----   Message from Barbette Merino, NP sent at 08/06/2019  1:30 PM EDT ----- Please make patient aware that his labs are stable.  TSH slightly elevated.  We will recheck in 3 months.

## 2019-08-06 NOTE — Telephone Encounter (Signed)
Called, no answer. Left a message to call back regarding x ray.

## 2019-08-07 NOTE — Telephone Encounter (Signed)
Called, patient asked if we can call back around 11:30. Will try at that time.

## 2019-08-07 NOTE — Telephone Encounter (Signed)
Called and spoke with patient using Language Resources line ID [phone]. Advised that cervical spine has gotten worse and that we can refer him to ortho for pain management for this if he wishes. At this time he does want to be sent to ortho. I will advised provider and asked that referral be placed. Thanks!

## 2019-08-07 NOTE — Telephone Encounter (Signed)
Crystal, Can you please place referral for this? Thank you!

## 2019-08-08 ENCOUNTER — Other Ambulatory Visit: Payer: Self-pay | Admitting: Nurse Practitioner

## 2019-08-08 DIAGNOSIS — M4802 Spinal stenosis, cervical region: Secondary | ICD-10-CM

## 2019-08-08 NOTE — Telephone Encounter (Signed)
Completed.

## 2019-08-13 MED FILL — ROSUVASTATIN CALCIUM 20 MG: 20 | 30 days supply | Qty: 30 | Fill #2

## 2019-08-13 MED FILL — ?TRAZODONE HCL 50 TABS: 50 | 30 days supply | Qty: 30 | Fill #1

## 2019-08-13 MED FILL — hydrOXYzine HCL 10 MG TABS: 10 | 10 days supply | Qty: 30 | Fill #2

## 2019-08-13 MED FILL — HYDROCHLOROTHIAZIDE 12.5 MG: 12.5 | 30 days supply | Qty: 30 | Fill #10

## 2019-08-13 MED FILL — FLUoxetine HCL 20 MG TABS: 20 | 30 days supply | Qty: 30 | Fill #2

## 2019-08-23 ENCOUNTER — Other Ambulatory Visit: Payer: Self-pay | Admitting: Family Medicine

## 2019-08-23 DIAGNOSIS — H1011 Acute atopic conjunctivitis, right eye: Secondary | ICD-10-CM

## 2019-08-23 MED FILL — hydrOXYzine HCL 10 MG TABS: 10 | 10 days supply | Qty: 30 | Fill #3

## 2019-08-23 MED FILL — OLOPATADINE HCL 0.2 % SOLN: 0.2 | 20 days supply | Qty: 3 | Fill #0

## 2019-08-23 MED FILL — ?CLOPIDOGREL 75MG TA: 75 | 30 days supply | Qty: 30 | Fill #1

## 2019-08-28 ENCOUNTER — Ambulatory Visit: Payer: Self-pay | Admitting: Orthopaedic Surgery

## 2019-08-29 ENCOUNTER — Ambulatory Visit: Payer: Self-pay

## 2019-09-18 ENCOUNTER — Other Ambulatory Visit: Payer: Self-pay | Admitting: Nurse Practitioner

## 2019-09-18 DIAGNOSIS — F320 Major depressive disorder, single episode, mild: Secondary | ICD-10-CM

## 2019-09-18 MED FILL — ?MECLIZINE 25MG TAB: 25 | 20 days supply | Qty: 60 | Fill #2

## 2019-09-18 MED FILL — ROSUVASTATIN CALCIUM 20 MG: 20 | 30 days supply | Qty: 30 | Fill #3

## 2019-09-18 MED FILL — FLUoxetine HCL 20 MG TABS: 20 | 30 days supply | Qty: 30 | Fill #3

## 2019-09-18 MED FILL — HYDROCHLOROTHIAZIDE 12.5 MG: 12.5 | 30 days supply | Qty: 30 | Fill #11

## 2019-09-18 NOTE — Telephone Encounter (Signed)
Is this okay to refill? 

## 2019-09-19 MED FILL — hydrOXYzine HCL 10 MG TABS: 10 | 10 days supply | Qty: 30 | Fill #0

## 2019-09-27 MED FILL — ?CLOPIDOGREL 75MG TA: 75 | 30 days supply | Qty: 30 | Fill #2

## 2019-10-01 ENCOUNTER — Ambulatory Visit: Payer: Self-pay

## 2019-10-18 ENCOUNTER — Other Ambulatory Visit: Payer: Self-pay | Admitting: Nurse Practitioner

## 2019-10-18 DIAGNOSIS — F320 Major depressive disorder, single episode, mild: Secondary | ICD-10-CM

## 2019-10-18 MED FILL — hydrOXYzine HCL 10 MG TABS: 10 | 10 days supply | Qty: 30 | Fill #1

## 2019-10-18 MED FILL — ROSUVASTATIN CALCIUM 20 MG: 20 | 30 days supply | Qty: 30 | Fill #0

## 2019-10-18 MED FILL — HYDROCHLOROTHIAZIDE 12.5 MG: 12.5 | 30 days supply | Qty: 30 | Fill #0

## 2019-10-18 MED FILL — ?MECLIZINE 25MG TAB: 25 | 20 days supply | Qty: 60 | Fill #3

## 2019-10-23 ENCOUNTER — Other Ambulatory Visit: Payer: Self-pay | Admitting: Nurse Practitioner

## 2019-10-23 DIAGNOSIS — F320 Major depressive disorder, single episode, mild: Secondary | ICD-10-CM

## 2019-10-23 MED FILL — FLUoxetine HCL 20 MG TABS: 20 | 30 days supply | Qty: 30 | Fill #0

## 2019-10-23 MED FILL — ?CLOPIDOGREL 75MG TA: 75 | 30 days supply | Qty: 30 | Fill #3

## 2019-10-23 MED FILL — ?TRAZODONE HCL 50 TABS: 50 | 30 days supply | Qty: 30 | Fill #2

## 2019-10-24 MED FILL — FAMOTIDINE 20 MG TABS: 20 | 30 days supply | Qty: 60 | Fill #2

## 2019-11-01 ENCOUNTER — Ambulatory Visit: Payer: Self-pay | Admitting: Nurse Practitioner

## 2019-11-19 ENCOUNTER — Other Ambulatory Visit: Payer: Self-pay | Admitting: Nurse Practitioner

## 2019-11-19 ENCOUNTER — Other Ambulatory Visit: Payer: Self-pay | Admitting: Family Medicine

## 2019-11-19 DIAGNOSIS — I10 Essential (primary) hypertension: Secondary | ICD-10-CM

## 2019-11-19 DIAGNOSIS — K219 Gastro-esophageal reflux disease without esophagitis: Secondary | ICD-10-CM

## 2019-11-19 DIAGNOSIS — I639 Cerebral infarction, unspecified: Secondary | ICD-10-CM

## 2019-11-19 DIAGNOSIS — F320 Major depressive disorder, single episode, mild: Secondary | ICD-10-CM

## 2019-11-19 MED FILL — hydrOXYzine HCL 10 MG TABS: 10 | 10 days supply | Qty: 30 | Fill #2

## 2019-11-19 MED FILL — ROSUVASTATIN CALCIUM 20 MG: 20 | 30 days supply | Qty: 30 | Fill #0

## 2019-11-19 MED FILL — FLUoxetine HCL 20 MG TABS: 20 | 30 days supply | Qty: 30 | Fill #0

## 2019-11-19 MED FILL — HYDROCHLOROTHIAZIDE 12.5 MG: 12.5 | 30 days supply | Qty: 30 | Fill #1

## 2019-11-19 MED FILL — ?MECLIZINE 25MG TAB: 25 | 20 days supply | Qty: 60 | Fill #0

## 2019-11-19 MED FILL — ?CLOPIDOGREL 75MG TA: 75 | 30 days supply | Qty: 30 | Fill #4

## 2019-11-19 MED FILL — FAMOTIDINE 20 MG TABS: 20 | 30 days supply | Qty: 60 | Fill #0

## 2019-11-19 MED FILL — ?TRAZODONE HCL 50 TABS: 50 | 30 days supply | Qty: 30 | Fill #3

## 2019-11-23 ENCOUNTER — Ambulatory Visit: Payer: Self-pay

## 2019-12-20 ENCOUNTER — Other Ambulatory Visit: Payer: Self-pay | Admitting: Family Medicine

## 2019-12-20 ENCOUNTER — Other Ambulatory Visit: Payer: Self-pay | Admitting: Nurse Practitioner

## 2019-12-20 DIAGNOSIS — F320 Major depressive disorder, single episode, mild: Secondary | ICD-10-CM

## 2019-12-20 DIAGNOSIS — I639 Cerebral infarction, unspecified: Secondary | ICD-10-CM

## 2019-12-20 DIAGNOSIS — I10 Essential (primary) hypertension: Secondary | ICD-10-CM

## 2019-12-20 DIAGNOSIS — R42 Dizziness and giddiness: Secondary | ICD-10-CM

## 2019-12-20 DIAGNOSIS — G4700 Insomnia, unspecified: Secondary | ICD-10-CM

## 2019-12-20 MED FILL — hydrOXYzine HCL 10 MG TABS: 10 | 10 days supply | Qty: 30 | Fill #3

## 2019-12-20 MED FILL — FAMOTIDINE 20 MG TABS: 20 | 30 days supply | Qty: 60 | Fill #1

## 2019-12-20 MED FILL — ?CLOPIDOGREL 75MG TA: 75 | 30 days supply | Qty: 30 | Fill #5

## 2019-12-20 MED FILL — HYDROCHLOROTHIAZIDE 12.5 MG: 12.5 | 30 days supply | Qty: 30 | Fill #2

## 2019-12-21 MED FILL — ?MECLIZINE 25MG TAB: 25 | 20 days supply | Qty: 60 | Fill #0

## 2019-12-21 MED FILL — ROSUVASTATIN CALCIUM 20 MG: 20 | 30 days supply | Qty: 30 | Fill #0

## 2019-12-21 MED FILL — ?TRAZODONE HCL 50 TABS: 50 | 30 days supply | Qty: 30 | Fill #0

## 2020-01-18 NOTE — Telephone Encounter (Signed)
Please review. Thanks!  

## 2020-01-21 NOTE — Telephone Encounter (Signed)
Hi  Can you contact him. He needs to follow up.  If he schedules a f/u then we can refill  Thanks

## 2020-01-24 ENCOUNTER — Other Ambulatory Visit: Payer: Self-pay | Admitting: Nurse Practitioner

## 2020-01-24 DIAGNOSIS — I639 Cerebral infarction, unspecified: Secondary | ICD-10-CM

## 2020-01-24 DIAGNOSIS — I10 Essential (primary) hypertension: Secondary | ICD-10-CM

## 2020-01-24 DIAGNOSIS — F320 Major depressive disorder, single episode, mild: Secondary | ICD-10-CM

## 2020-01-24 DIAGNOSIS — R42 Dizziness and giddiness: Secondary | ICD-10-CM

## 2020-01-24 MED FILL — FAMOTIDINE 20 MG TABS: 20 | 30 days supply | Qty: 60 | Fill #2

## 2020-01-24 MED FILL — HYDROCHLOROTHIAZIDE 12.5 MG: 12.5 | 30 days supply | Qty: 30 | Fill #3

## 2020-01-24 MED FILL — ?CLOPIDOGREL 75MG TA: 75 | 30 days supply | Qty: 30 | Fill #6

## 2020-01-24 MED FILL — ROSUVASTATIN CALCIUM 20 MG: 20 | 30 days supply | Qty: 30 | Fill #0

## 2020-01-24 MED FILL — ?MECLIZINE 25MG TAB: 25 | 20 days supply | Qty: 60 | Fill #0

## 2020-01-24 MED FILL — hydrOXYzine HCL 10 MG TABS: 10 | 10 days supply | Qty: 30 | Fill #0

## 2020-01-24 MED FILL — ?TRAZODONE HCL 50 TABS: 50 | 30 days supply | Qty: 30 | Fill #1

## 2020-01-28 NOTE — Telephone Encounter (Signed)
Called lvm to call office to schedule appointment

## 2020-02-19 ENCOUNTER — Other Ambulatory Visit: Payer: Self-pay | Admitting: Nurse Practitioner

## 2020-02-19 DIAGNOSIS — I639 Cerebral infarction, unspecified: Secondary | ICD-10-CM

## 2020-02-19 DIAGNOSIS — I10 Essential (primary) hypertension: Secondary | ICD-10-CM

## 2020-02-19 DIAGNOSIS — R42 Dizziness and giddiness: Secondary | ICD-10-CM

## 2020-02-19 DIAGNOSIS — K219 Gastro-esophageal reflux disease without esophagitis: Secondary | ICD-10-CM

## 2020-02-19 MED FILL — ?CLOPIDOGREL 75MG TA: 75 | 30 days supply | Qty: 30 | Fill #7

## 2020-02-19 MED FILL — hydrOXYzine HCL 10 MG TABS: 10 | 10 days supply | Qty: 30 | Fill #1

## 2020-02-19 MED FILL — HYDROCHLOROTHIAZIDE 12.5 MG: 12.5 | 30 days supply | Qty: 30 | Fill #0

## 2020-02-19 NOTE — Telephone Encounter (Signed)
Please see RX request.   

## 2020-02-20 NOTE — Telephone Encounter (Signed)
Please make patient aware that he needs an appointment

## 2020-02-25 ENCOUNTER — Other Ambulatory Visit: Payer: Self-pay | Admitting: Nurse Practitioner

## 2020-02-25 DIAGNOSIS — R42 Dizziness and giddiness: Secondary | ICD-10-CM

## 2020-02-28 ENCOUNTER — Other Ambulatory Visit: Payer: Self-pay | Admitting: Nurse Practitioner

## 2020-02-28 DIAGNOSIS — I10 Essential (primary) hypertension: Secondary | ICD-10-CM

## 2020-02-28 DIAGNOSIS — I639 Cerebral infarction, unspecified: Secondary | ICD-10-CM

## 2020-02-28 DIAGNOSIS — G4700 Insomnia, unspecified: Secondary | ICD-10-CM

## 2020-02-28 NOTE — Telephone Encounter (Signed)
Please see patient request for medication refills

## 2020-02-28 NOTE — Telephone Encounter (Signed)
Pt needs a follow up apt

## 2020-02-29 ENCOUNTER — Other Ambulatory Visit: Payer: Self-pay | Admitting: Nurse Practitioner

## 2020-02-29 DIAGNOSIS — G4700 Insomnia, unspecified: Secondary | ICD-10-CM

## 2020-02-29 DIAGNOSIS — I10 Essential (primary) hypertension: Secondary | ICD-10-CM

## 2020-02-29 DIAGNOSIS — I639 Cerebral infarction, unspecified: Secondary | ICD-10-CM

## 2020-02-29 MED ORDER — ROSUVASTATIN CALCIUM 20 MG PO TABS: 20.0000 mg | ORAL_TABLET | Freq: Every day | ORAL | 0 refills | Status: DC

## 2020-02-29 MED ORDER — TRAZODONE HCL 50 MG PO TABS: 25.0000 mg | ORAL_TABLET | Freq: Every evening | ORAL | 0 refills | Status: DC | PRN

## 2020-02-29 MED FILL — traZODone HCL 50 MG TABS: 50 | 30 days supply | Qty: 30 | Fill #0

## 2020-02-29 MED FILL — ROSUVASTATIN CALCIUM 20 MG: 20 | 30 days supply | Qty: 30 | Fill #0

## 2020-03-06 ENCOUNTER — Encounter: Payer: Self-pay | Admitting: Nurse Practitioner

## 2020-03-06 ENCOUNTER — Other Ambulatory Visit: Payer: Self-pay

## 2020-03-06 ENCOUNTER — Ambulatory Visit (INDEPENDENT_AMBULATORY_CARE_PROVIDER_SITE_OTHER): Payer: HRSA Program | Admitting: Nurse Practitioner

## 2020-03-06 ENCOUNTER — Other Ambulatory Visit: Payer: Self-pay | Admitting: Nurse Practitioner

## 2020-03-06 VITALS — BP 136/89 | HR 57 | Temp 97.5°F | Resp 18 | Ht 64.96 in | Wt 183.2 lb

## 2020-03-06 DIAGNOSIS — Z1211 Encounter for screening for malignant neoplasm of colon: Secondary | ICD-10-CM

## 2020-03-06 DIAGNOSIS — R319 Hematuria, unspecified: Secondary | ICD-10-CM

## 2020-03-06 DIAGNOSIS — I10 Essential (primary) hypertension: Secondary | ICD-10-CM

## 2020-03-06 DIAGNOSIS — G4700 Insomnia, unspecified: Secondary | ICD-10-CM

## 2020-03-06 DIAGNOSIS — R7989 Other specified abnormal findings of blood chemistry: Secondary | ICD-10-CM

## 2020-03-06 DIAGNOSIS — F320 Major depressive disorder, single episode, mild: Secondary | ICD-10-CM

## 2020-03-06 DIAGNOSIS — E785 Hyperlipidemia, unspecified: Secondary | ICD-10-CM

## 2020-03-06 DIAGNOSIS — I639 Cerebral infarction, unspecified: Secondary | ICD-10-CM

## 2020-03-06 DIAGNOSIS — H1011 Acute atopic conjunctivitis, right eye: Secondary | ICD-10-CM

## 2020-03-06 DIAGNOSIS — K219 Gastro-esophageal reflux disease without esophagitis: Secondary | ICD-10-CM

## 2020-03-06 LAB — POCT URINALYSIS DIP (CLINITEK)
Bilirubin, UA: NEGATIVE
Glucose, UA: NEGATIVE mg/dL
Ketones, POC UA: NEGATIVE mg/dL
Leukocytes, UA: NEGATIVE
Nitrite, UA: NEGATIVE
POC PROTEIN,UA: NEGATIVE
Spec Grav, UA: 1.02 (ref 1.010–1.025)
Urobilinogen, UA: 0.2 E.U./dL
pH, UA: 6.5 (ref 5.0–8.0)

## 2020-03-06 MED ORDER — ROSUVASTATIN CALCIUM 20 MG PO TABS: 20.0000 mg | ORAL_TABLET | Freq: Every day | ORAL | 3 refills | Status: DC

## 2020-03-06 MED ORDER — TRAZODONE HCL 50 MG PO TABS: 25.0000 mg | ORAL_TABLET | Freq: Every day | ORAL | 3 refills | Status: DC

## 2020-03-06 MED ORDER — FAMOTIDINE 20 MG PO TABS
20.0000 mg | ORAL_TABLET | Freq: Two times a day (BID) | ORAL | 3 refills | Status: DC
  Filled 2020-07-28: qty 60, 30d supply, fill #0
  Filled 2020-08-25: qty 60, 30d supply, fill #1
  Filled 2020-09-24: qty 60, 30d supply, fill #2
  Filled 2020-10-27: qty 60, 30d supply, fill #3
  Filled 2020-11-24: qty 60, 30d supply, fill #4
  Filled 2020-12-23: qty 60, 30d supply, fill #5
  Filled 2021-01-26: qty 60, 30d supply, fill #6
  Filled 2021-02-27: qty 60, 30d supply, fill #7

## 2020-03-06 MED ORDER — HYDROCHLOROTHIAZIDE 12.5 MG PO TABS: 12.5000 mg | ORAL_TABLET | Freq: Every day | ORAL | 3 refills | Status: DC

## 2020-03-06 MED ORDER — HYDROXYZINE HCL 10 MG PO TABS: 10.0000 mg | ORAL_TABLET | Freq: Three times a day (TID) | ORAL | 3 refills | Status: AC | PRN

## 2020-03-06 MED ORDER — CLOPIDOGREL BISULFATE 75 MG PO TABS: 75.0000 mg | ORAL_TABLET | Freq: Every day | ORAL | 3 refills | Status: DC

## 2020-03-06 MED ORDER — OLOPATADINE HCL 0.2 % OP SOLN: 1.0000 [drp] | Freq: Every day | OPHTHALMIC | 11 refills | Status: DC | PRN

## 2020-03-06 MED FILL — ?FAMOTIDINE 20MG TABLET: 20 | 30 days supply | Qty: 60 | Fill #0

## 2020-03-06 MED FILL — OLOPATADINE HCL 0.2 % SOLN: 0.2 | 15 days supply | Qty: 3 | Fill #0

## 2020-03-06 NOTE — Progress Notes (Signed)
Needs refills meclizine, hydroxyzine, trazadone, HCTZ, rosuvastatin & eye drops   C/o increased warmth to back when sitting on couch and in bed x 4 months, affects sleep, no sweating

## 2020-03-06 NOTE — Progress Notes (Signed)
[facility] Patient [facility] [address] [place], [place]  [address] Phone:  [phone]   Fax:  [phone]   Established Patient Office Visit  Subjective:  Patient ID: Mark Robles, male    DOB: [birth year]  Age: 69 y.o. MRN: [id number]  CC:  Chief Complaint  Patient presents with  . Follow-up    HPI Ralpheal Zappone presents for follow up. He  has a past medical history of Depression, DVT (deep venous thrombosis) (HCC), Hyperlipidemia, Left-sided sensory deficit present, Seizure (HCC), and Stroke (HCC).   Hypertension Patient is here for follow-up of elevated blood pressure. He is not exercising due to his hemiparesis. He is adherent to a low-salt diet. Blood pressure is not monitored at home. Cardiac symptoms: none. Patient denies chest pain, exertional chest pressure/discomfort, irregular heart beat, lower extremity edema, palpitations and syncope. Cardiovascular risk factors: advanced age (older than 43 for men, 65 for women), dyslipidemia, hypertension, male gender, obesity (BMI >= 30 kg/m2) and sedentary lifestyle. Use of agents associated with hypertension: none. History of target organ damage: stroke.    Past Medical History:  Diagnosis Date  . Depression   . DVT (deep venous thrombosis) (HCC)   . Hyperlipidemia   . Left-sided sensory deficit present   . Seizure (HCC)   . Stroke [facility])     Past Surgical History:  Procedure Laterality Date  . NO PAST SURGERIES      Family History  Problem Relation Age of Onset  . Hypertension Mother        tachycardia, pacemaker  . Hypertension Sister   . Hypertension Brother   . Hypertension Maternal Uncle     Social History   Socioeconomic History  . Marital status: Married    Spouse name: Not on file  . Number of children: 3  . Years of education: 6th grade  . Highest education level: Not on file  Occupational History  . Not on file  Tobacco Use  . Smoking status: Never Smoker    . Smokeless tobacco: Never Used  Vaping Use  . Vaping Use: Never used  Substance and Sexual Activity  . Alcohol use: No  . Drug use: No  . Sexual activity: Not Currently  Other Topics Concern  . Not on file  Social History Narrative   Lives at home with wife & 1 child   Right handed   No caffeine    Social Determinants of Health   Financial Resource Strain:   . Difficulty of Paying Living Expenses: Not on file  Food Insecurity:   . Worried About Programme researcher, broadcasting/film/video in the Last Year: Not on file  . Ran Out of Food in the Last Year: Not on file  Transportation Needs:   . Lack of Transportation (Medical): Not on file  . Lack of Transportation (Non-Medical): Not on file  Physical Activity:   . Days of Exercise per Week: Not on file  . Minutes of Exercise per Session: Not on file  Stress:   . Feeling of Stress : Not on file  Social Connections:   . Frequency of Communication with Friends and Family: Not on file  . Frequency of Social Gatherings with Friends and Family: Not on file  . Attends Religious Services: Not on file  . Active Member of Clubs or Organizations: Not on file  . Attends Banker Meetings: Not on file  . Marital Status: Not on file  Intimate Partner Violence:   .  Fear of Current or Ex-Partner: Not on file  . Emotionally Abused: Not on file  . Physically Abused: Not on file  . Sexually Abused: Not on file    Outpatient Medications Prior to Visit  Medication Sig Dispense Refill  . FLUoxetine (PROZAC) 20 MG tablet TOME 1 PASTILLA POR VIA ORAL CADA MANANA 30 tablet 0  . fluticasone (FLONASE) 50 MCG/ACT nasal spray Place 2 sprays into both nostrils daily. 16 g 6  . loperamide (IMODIUM) 2 MG capsule TAKE 2 CAPSULES BY MOUTH INITIALLY THEN 1 CAPSULE AFTER EACH LOOSE STOOL (DO NOT EXCEED 8 CAPSULES IN 24 HOURS)  0  . meclizine (ANTIVERT) 25 MG tablet TAKE 1 TABLET BY MOUTH 3 TIMES DAILY AS NEEDED FOR DIZZINESS. 60 tablet 3  . clopidogrel (PLAVIX) 75  MG tablet Take 1 tablet (75 mg total) by mouth daily. 30 tablet 11  . famotidine (PEPCID) 20 MG tablet TAKE 1 TABLET (20 MG TOTAL) BY MOUTH 2 (TWO) TIMES DAILY. 60 tablet 2  . hydrochlorothiazide (HYDRODIURIL) 12.5 MG tablet Take 1 tablet (12.5 mg total) by mouth daily. 30 tablet 3  . hydrOXYzine (ATARAX/VISTARIL) 10 MG tablet TOME 1 PASTILLA POR VIA ORAL 3 VECES AL DIA SEGUN SEA NECESARIO 30 tablet 1  . meclizine (ANTIVERT) 25 MG tablet TAKE 1 TABLET BY MOUTH 3 TIMES DAILY AS NEEDED FOR DIZZINESS. 60 tablet 0  . Olopatadine HCl 0.2 % SOLN APPLY 1 DROP TO EYE DAILY AS NEEDED (ITCHING EYES). 2.5 mL 0  . rosuvastatin (CRESTOR) 20 MG tablet Take 1 tablet (20 mg total) by mouth daily. 30 tablet 0  . traZODone (DESYREL) 50 MG tablet Take 0.5-1 tablets (25-50 mg total) by mouth at bedtime as needed for sleep. 30 tablet 0  . hydrochlorothiazide (MICROZIDE) 12.5 MG capsule Take by mouth daily.     No facility-administered medications prior to visit.    Allergies  Allergen Reactions  . Amlodipine Itching and Swelling    ROS Review of Systems  Neurological:       Head pain   Psychiatric/Behavioral:       Insomnia       Objective:    Physical Exam HENT:     Head: Normocephalic and atraumatic.     Nose: Nose normal.     Mouth/Throat:     Mouth: Mucous membranes are moist.  Cardiovascular:     Rate and Rhythm: Normal rate and regular rhythm.     Pulses: Normal pulses.     Heart sounds: Normal heart sounds.  Abdominal:     General: Bowel sounds are normal.     Palpations: Abdomen is soft.  Musculoskeletal:     Cervical back: Normal range of motion.  Skin:    General: Skin is warm and dry.     Capillary Refill: Capillary refill takes less than 2 seconds.     Comments: Cane in use  Neurological:     General: No focal deficit present.     Mental Status: He is alert and oriented to person, place, and time.     Motor: Weakness present.     Gait: Gait abnormal.     Comments: Left  sided  Psychiatric:        Mood and Affect: Mood normal.        Behavior: Behavior normal.        Thought Content: Thought content normal.        Judgment: Judgment normal.     BP 136/89 (BP Location: Right Arm,  Patient Position: Sitting, Cuff Size: Normal)   Pulse (!) 57   Temp (!) 97.5 F (36.4 C) (Temporal)   Resp 18   Ht 5' 4.96" (1.65 m)   Wt 183 lb 3.2 oz (83.1 kg)   SpO2 97%   BMI 30.52 kg/m  Wt Readings from Last 3 Encounters:  03/06/20 183 lb 3.2 oz (83.1 kg)  08/02/19 180 lb 9.6 oz (81.9 kg)  05/04/19 182 lb (82.6 kg)     There are no preventive care reminders to display for this patient.  There are no preventive care reminders to display for this patient.  Lab Results  Component Value Date   TSH 3.130 03/06/2020   Lab Results  Component Value Date   WBC 4.0 08/02/2019   HGB 16.5 08/02/2019   HCT 49.7 08/02/2019   MCV 97 08/02/2019   PLT 219 08/02/2019   Lab Results  Component Value Date   NA 139 03/06/2020   K 4.0 03/06/2020   CO2 23 10/13/2018   GLUCOSE 96 03/06/2020   BUN 11 03/06/2020   CREATININE 0.79 03/06/2020   BILITOT 0.4 03/06/2020   ALKPHOS 42 (L) 03/06/2020   AST 26 03/06/2020   ALT 25 10/13/2018   PROT 7.3 03/06/2020   ALBUMIN 4.6 03/06/2020   CALCIUM 9.1 03/06/2020   ANIONGAP 7 08/16/2018   Lab Results  Component Value Date   CHOL 182 03/06/2020   Lab Results  Component Value Date   HDL 58 03/06/2020   Lab Results  Component Value Date   LDLCALC 103 (H) 03/06/2020   Lab Results  Component Value Date   TRIG 116 03/06/2020   Lab Results  Component Value Date   CHOLHDL 3.1 03/06/2020   Lab Results  Component Value Date   HGBA1C 5.6 08/02/2019      Assessment & Plan:   Problem List Items Addressed This Visit      Cardiovascular and Mediastinum   Cerebrovascular accident (CVA) (HCC)   Relevant Medications   clopidogrel (PLAVIX) 75 MG tablet   hydrochlorothiazide (HYDRODIURIL) 12.5 MG tablet   rosuvastatin  (CRESTOR) 20 MG tablet   Essential hypertension - Primary Encouraged on going compliance with current medication regimen Encouraged home monitoring and recording BP <130/80 Eating a heart-healthy diet with less salt Encouraged regular physical activity  Recommend Weight loss   Relevant Medications   hydrochlorothiazide (HYDRODIURIL) 12.5 MG tablet   rosuvastatin (CRESTOR) 20 MG tablet   Other Relevant Orders   POCT URINALYSIS DIP (CLINITEK) (Completed)   Comp. Metabolic Panel (12) (Completed)     Other   Insomnia   Relevant Medications   traZODone (DESYREL) 50 MG tablet   Current mild episode of major depressive disorder without prior episode (HCC)   Relevant Medications   hydrOXYzine (ATARAX/VISTARIL) 10 MG tablet   traZODone (DESYREL) 50 MG tablet   Dyslipidemia   Relevant Medications   rosuvastatin (CRESTOR) 20 MG tablet   Other Relevant Orders   Lipid panel (Completed)    Other Visit Diagnoses    Hematuria, unspecified type     Trace hematuria we will continue to monitor if symptoms persist or get worse urology referral and considering holding Plavix for a few days to see if this resolves   Relevant Orders   Urine Culture (Completed)   Gastroesophageal reflux disease without esophagitis     Stable we will continue with current regimen   Relevant Medications   famotidine (PEPCID) 20 MG tablet   Allergic conjunctivitis of right eye  Stable we will continue with current regimen   Relevant Medications   Olopatadine HCl 0.2 % SOLN   Elevated TSH   Evaluation pending   Relevant Orders   TSH (Completed)   Encounter for screening colonoscopy     Referral placed for colorectal screening   Relevant Orders   Ambulatory referral to Gastroenterology      Meds ordered this encounter  Medications  . clopidogrel (PLAVIX) 75 MG tablet    Sig: Take 1 tablet (75 mg total) by mouth daily.    Dispense:  90 tablet    Refill:  3    Spanish please    Order Specific Question:    Supervising Provider    Answer:   Quentin Angst [id number]  . famotidine (PEPCID) 20 MG tablet    Sig: Take 1 tablet (20 mg total) by mouth 2 (two) times daily.    Dispense:  180 tablet    Refill:  3    Order Specific Question:   Supervising Provider    Answer:   Quentin Angst [id number]  . hydrochlorothiazide (HYDRODIURIL) 12.5 MG tablet    Sig: Take 1 tablet (12.5 mg total) by mouth daily.    Dispense:  90 tablet    Refill:  3    Order Specific Question:   Supervising Provider    Answer:   Quentin Angst [id number]  . hydrOXYzine (ATARAX/VISTARIL) 10 MG tablet    Sig: Take 1 tablet (10 mg total) by mouth 3 (three) times daily as needed for anxiety.    Dispense:  90 tablet    Refill:  3    Order Specific Question:   Supervising Provider    Answer:   Quentin Angst [id number]  . Olopatadine HCl 0.2 % SOLN    Sig: Apply 1 drop to eye daily as needed (itching eyes).    Dispense:  2.5 mL    Refill:  11    Order Specific Question:   Supervising Provider    Answer:   Quentin Angst [id number]  . rosuvastatin (CRESTOR) 20 MG tablet    Sig: Take 1 tablet (20 mg total) by mouth daily.    Dispense:  90 tablet    Refill:  3    Order Specific Question:   Supervising Provider    Answer:   Quentin Angst [id number]  . traZODone (DESYREL) 50 MG tablet    Sig: Take 0.5-1 tablets (25-50 mg total) by mouth at bedtime.    Dispense:  90 tablet    Refill:  3    Order Specific Question:   Supervising Provider    Answer:   Quentin Angst [id number]    Follow-up: Return in about 3 months (around 06/06/2020).    Barbette Merino, NP

## 2020-03-06 NOTE — Patient Instructions (Addendum)
Colonoscopia en los adultos, cuidados posteriores Colonoscopy, Adult, Care After Esta hoja le brinda informacin sobre cmo cuidarse despus del procedimiento. El mdico tambin podr darle instrucciones ms especficas. Si tiene problemas o preguntas, llame a su mdico. Qu puedo esperar despus del procedimiento? Despus del procedimiento, es normal tener los siguientes sntomas:  Una pequea cantidad de sangre en la materia fecal (heces) durante 24 horas.  Gases.  Clicos leves o distensin en el vientre (abdomen). Siga estas instrucciones en su casa: Comida y bebida   Beba suficiente lquido para mantener el pis (la [place]) de color amarillo plido.  Siga las instrucciones del mdico respecto de lo que no puede comer o beber.  Reanude la dieta normal como se lo haya indicado el mdico. Evite los alimentos pesados o fritos que son difciles de Location manager. Actividad  Haga reposo como se lo haya indicado el mdico.  No permanezca sentado durante un perodo prolongado sin moverse. Levntese y camine un poco cada 1 a 2 horas. Esto es importante. Pida ayuda si se siente dbil o inestable.  Retome sus actividades normales segn lo indicado por el mdico. Pregntele al mdico qu actividades son seguras para usted. Para aliviar los clicos y la hinchazn:   Intente caminar por la casa.  Aplquese calor en el vientre como se lo haya indicado el mdico. Use la fuente de calor que el mdico le recomiende, como una compresa de calor hmedo o una almohadilla trmica. ? Ponga una toalla entre la piel y la fuente de Airline pilot. ? Aplique calor durante 20 a . ? Retire la fuente de calor si la piel se pone de color rojo brillante. Esto es muy importante si no puede Financial risk analyst, calor o fro. Puede correr un riesgo mayor de sufrir quemaduras. Instrucciones generales  Durante las primeras 24horas despus del procedimiento: ? No conduzca ni opere maquinaria. ? No firme documentos  importantes. ? No beba alcohol. ? Haga sus actividades diarias ms lentamente que lo normal. ? Coma alimentos que sean blandos y fciles de Location manager.  Tome los medicamentos de venta libre o los recetados solamente como se lo haya indicado el mdico.  Oceanographer a todas las visitas de seguimiento como se lo haya indicado el mdico. Esto es importante. Comunquese con un mdico si:  Tiene sangre en la materia fecal 2 o 3 das despus del procedimiento. Solicite ayuda de inmediato si:  Hay ms que una pequea cantidad de [employer] materia fecal.  Observa grandes grumos de tejido (cogulos de sangre) en la materia fecal.  Tiene el abdomen hinchado.  Tiene ganas de vomitar (nuseas).  Vomita.  Tiene fiebre.  Tiene dolor en el vientre que empeora y no se alivia con los medicamentos. Resumen  Despus del procedimiento, es normal tener una pequea cantidad de [employer]. Tambin puede tener clicos o hinchazn leves en el abdomen.  Durante las primeras 24 horas despus del procedimiento, no conduzca ni opere maquinaria, no firme documentos importantes y no beba alcohol.  Obtenga ayuda de inmediato si tiene [employer], tiene ganas de Biochemist, clinical, tiene fiebre o Chief Technology Officer abdominal [place]. Esta informacin no tiene Theme park manager el consejo del mdico. Asegrese de hacerle al mdico cualquier pregunta que tenga. Document Revised: 12/20/2018 Document Reviewed: 12/20/2018    Hipertensin en los adultos Hypertension, Adult El trmino hipertensin es otra forma de denominar a la presin arterial elevada. La presin arterial elevada fuerza al corazn a trabajar ms para bombear la sangre. Esto  puede causar problemas con el paso del [place]. Una lectura de presin arterial est compuesta por 2 nmeros. Hay un nmero superior (sistlico) sobre un nmero inferior (diastlico). Lo ideal es tener la presin arterial por debajo de 120/80. Las elecciones saludables pueden  ayudar a Personal assistant presin arterial, o tal vez necesite medicamentos para bajarla. Cules son las causas? Se desconoce la causa de esta afeccin. Algunas afecciones pueden estar relacionadas con la presin arterial alta. Qu incrementa el riesgo?  Fumar.  Tener diabetes mellitus tipo 2, colesterol alto, o ambos.  No hacer la cantidad suficiente de actividad fsica o ejercicio.  Tener sobrepeso.  Consumir mucha grasa, azcar, caloras o sal (sodio) en su dieta.  Beber alcohol en exceso.  Tener una enfermedad renal a largo plazo (crnica).  Tener antecedentes familiares de presin arterial alta.  Edad. Los riesgos aumentan con la edad.  Raza. El riesgo es mayor para las Statistician.  Sexo. Antes de los 45aos, los hombres corren ms [employer]. Despus de los 65aos, las mujeres corren ms [employer].  Tener apnea obstructiva del sueo.  Estrs. Cules son los signos o los sntomas?  Es posible que la presin arterial alta puede no cause sntomas. La presin arterial muy alta (crisis hipertensiva) puede provocar: ? Dolor de [place]. ? Sensaciones de preocupacin o nerviosismo (ansiedad). ? Falta de aire. ? Hemorragia nasal. ? Sensacin de Journalist, newspaper (nuseas). ? Vmitos. ? Cambios en la forma de ver. ? Dolor muy intenso en el pecho. ? Convulsiones. Cmo se trata?  Esta afeccin se trata haciendo cambios saludables en el estilo de vida, por ejemplo: ? Consumir alimentos saludables. ? Hacer ms ejercicio. ? Beber menos alcohol.  El mdico puede recetarle medicamentos si los cambios en el estilo de vida no son suficientes para Museum/gallery curator la presin arterial y si: ? El nmero de arriba est por encima de 130. ? El nmero de abajo est por encima de 80.  Su presin arterial personal ideal puede variar. Siga estas instrucciones en su casa: Comida y bebida   Si se lo dicen, siga el plan de alimentacin de DASH  (Dietary Approaches to Stop Hypertension, Maneras de alimentarse para detener la hipertensin). Para seguir este plan: ? Llene la mitad del plato de cada comida con frutas y verduras. ? Llene un cuarto del plato de cada comida con cereales integrales. Los cereales integrales incluyen pasta integral, arroz integral y pan integral. ? Coma y beba productos lcteos con bajo contenido de grasa, como leche descremada o yogur bajo en grasas. ? Llene un cuarto del plato de cada comida con protenas bajas en grasa (magras). Las protenas bajas en grasa incluyen pescado, pollo sin piel, huevos, frijoles y tofu. ? Evite consumir carne grasa, carne curada y procesada, o pollo con piel. ? Evite consumir alimentos prehechos o procesados.  Consuma menos de 1500 mg de sal por da.  No beba alcohol si: ? El mdico le indica que no lo haga. ? Est embarazada, puede estar embarazada o est tratando de quedar embarazada.  Si bebe alcohol: ? Limite la cantidad que bebe a lo siguiente:  De 0 a 1 medida por da para las mujeres.  De 0 a 2 medidas por da para los hombres. ? Est atento a la cantidad de alcohol que hay en las bebidas que toma. En los [place], una medida equivale a una botella de cerveza de 12oz ( ), un vaso de vino de 5oz ( )  o un vaso de una bebida alcohlica de alta graduacin de 1oz (71ml). Estilo de vida   Trabaje con su mdico para mantenerse en un peso saludable o para perder peso. Pregntele a su mdico cul es el peso recomendable para usted.  Haga al menos de ejercicio la [employer] de la [place]. Estos pueden incluir caminar, nadar o andar en bicicleta.  Realice al menos 30 minutos de ejercicio que fortalezca sus msculos (ejercicios de resistencia) al menos 3 das a la [place]. Estos pueden incluir levantar pesas o hacer Pilates.  No consuma ningn producto que contenga nicotina o tabaco, como cigarrillos, cigarrillos electrnicos y tabaco de  Theatre manager. Si necesita ayuda para dejar de fumar, consulte al [employer].  Controle su presin arterial en su casa tal como le indic el mdico.  Concurra a todas las visitas de seguimiento como se lo haya indicado el mdico. Esto es importante. Medicamentos  [employer] de venta libre y los recetados solamente como se lo haya indicado el mdico. Siga cuidadosamente las indicaciones.  No omita las dosis de medicamentos para la presin arterial. Los medicamentos pierden eficacia si omite dosis. El hecho de omitir las dosis tambin [place] el riesgo de otros problemas.  Pregntele a su mdico a qu efectos secundarios o reacciones a los Museum/gallery curator. Comunquese con un mdico si:  Piensa que tiene [place] reaccin a los medicamentos que est tomando.  Tiene dolores de cabeza frecuentes (recurrentes).  Se siente mareado.  Tiene hinchazn en los tobillos.  Tiene problemas de visin. Solicite ayuda inmediatamente si:  Siente un dolor de cabeza muy intenso.  Empieza a sentirse desorientado (confundido).  Se siente dbil o adormecido.  Siente que va a desmayarse.  Tiene un dolor muy intenso en las siguientes zonas: ? Pecho. ? Vientre (abdomen).  Vomita ms de una vez.  Tiene dificultad para respirar. Resumen  El trmino hipertensin es otra forma de denominar a la presin arterial elevada.  La presin arterial elevada fuerza al corazn a trabajar ms para bombear la sangre.  Para la [employer], una presin arterial normal es menor que 120/80.  Las decisiones saludables pueden ayudarle a disminuir su presin arterial. Si no puede bajar su presin arterial mediante decisiones saludables, es posible que deba tomar medicamentos. Esta informacin no tiene Theme park manager el consejo del mdico. Asegrese de hacerle al mdico cualquier pregunta que tenga. Document Revised: 01/19/2018 Document Reviewed: 01/19/2018 Elsevier Patient Education   2020 [employer].  Elsevier Patient Education  [employer].

## 2020-03-07 LAB — COMP. METABOLIC PANEL (12)
AST: 26 IU/L (ref 0–40)
Albumin/Globulin Ratio: 1.7 (ref 1.2–2.2)
Albumin: 4.6 g/dL (ref 3.8–4.8)
Alkaline Phosphatase: 42 IU/L — ABNORMAL LOW (ref 44–121)
BUN/Creatinine Ratio: 14 (ref 10–24)
BUN: 11 mg/dL (ref 8–27)
Bilirubin Total: 0.4 mg/dL (ref 0.0–1.2)
Calcium: 9.1 mg/dL (ref 8.6–10.2)
Chloride: 103 mmol/L (ref 96–106)
Creatinine, Ser: 0.79 mg/dL (ref 0.76–1.27)
GFR calc Af Amer: 107 mL/min/{1.73_m2} (ref >59)
GFR calc non Af Amer: 93 mL/min/{1.73_m2} (ref >59)
Globulin, Total: 2.7 g/dL (ref 1.5–4.5)
Glucose: 96 mg/dL (ref 65–99)
Potassium: 4 mmol/L (ref 3.5–5.2)
Sodium: 139 mmol/L (ref 134–144)
Total Protein: 7.3 g/dL (ref 6.0–8.5)

## 2020-03-07 LAB — LIPID PANEL
Chol/HDL Ratio: 3.1 ratio (ref 0.0–5.0)
Cholesterol, Total: 182 mg/dL (ref 100–199)
HDL: 58 mg/dL (ref >39)
LDL Chol Calc (NIH): 103 mg/dL — ABNORMAL HIGH (ref 0–99)
Triglycerides: 116 mg/dL (ref 0–149)
VLDL Cholesterol Cal: 21 mg/dL (ref 5–40)

## 2020-03-07 LAB — TSH: TSH: 3.13 u[IU]/mL (ref 0.450–4.500)

## 2020-03-08 LAB — URINE CULTURE

## 2020-03-13 ENCOUNTER — Encounter: Payer: Self-pay | Admitting: Nurse Practitioner

## 2020-03-17 ENCOUNTER — Other Ambulatory Visit: Payer: Self-pay | Admitting: Nurse Practitioner

## 2020-03-17 ENCOUNTER — Encounter: Payer: Self-pay | Admitting: Nurse Practitioner

## 2020-03-17 DIAGNOSIS — R3129 Other microscopic hematuria: Secondary | ICD-10-CM | POA: Insufficient documentation

## 2020-03-17 NOTE — Telephone Encounter (Signed)
Pt had appt on 11/18, will this be refilled?

## 2020-03-18 MED FILL — FLUoxetine HCL 20 MG TABS: 20 | 30 days supply | Qty: 30 | Fill #0

## 2020-03-24 MED FILL — ROSUVASTATIN CALCIUM 20 MG: 20 | 30 days supply | Qty: 30 | Fill #0

## 2020-03-24 MED FILL — CLOPIDOGREL 75 MG TABLET: 75 | 30 days supply | Qty: 30 | Fill #0

## 2020-03-24 MED FILL — HYDROCHLOROTHIAZIDE 12.5 MG: 12.5 | 30 days supply | Qty: 30 | Fill #0

## 2020-04-18 IMAGING — CR PORTABLE CHEST - 1 VIEW
1 series · 1 of 1 positions shown · non-contrast
Comparison: 08/01/2018

CLINICAL DATA: Pneumonia

EXAM:
PORTABLE CHEST 1 VIEW

[AP]
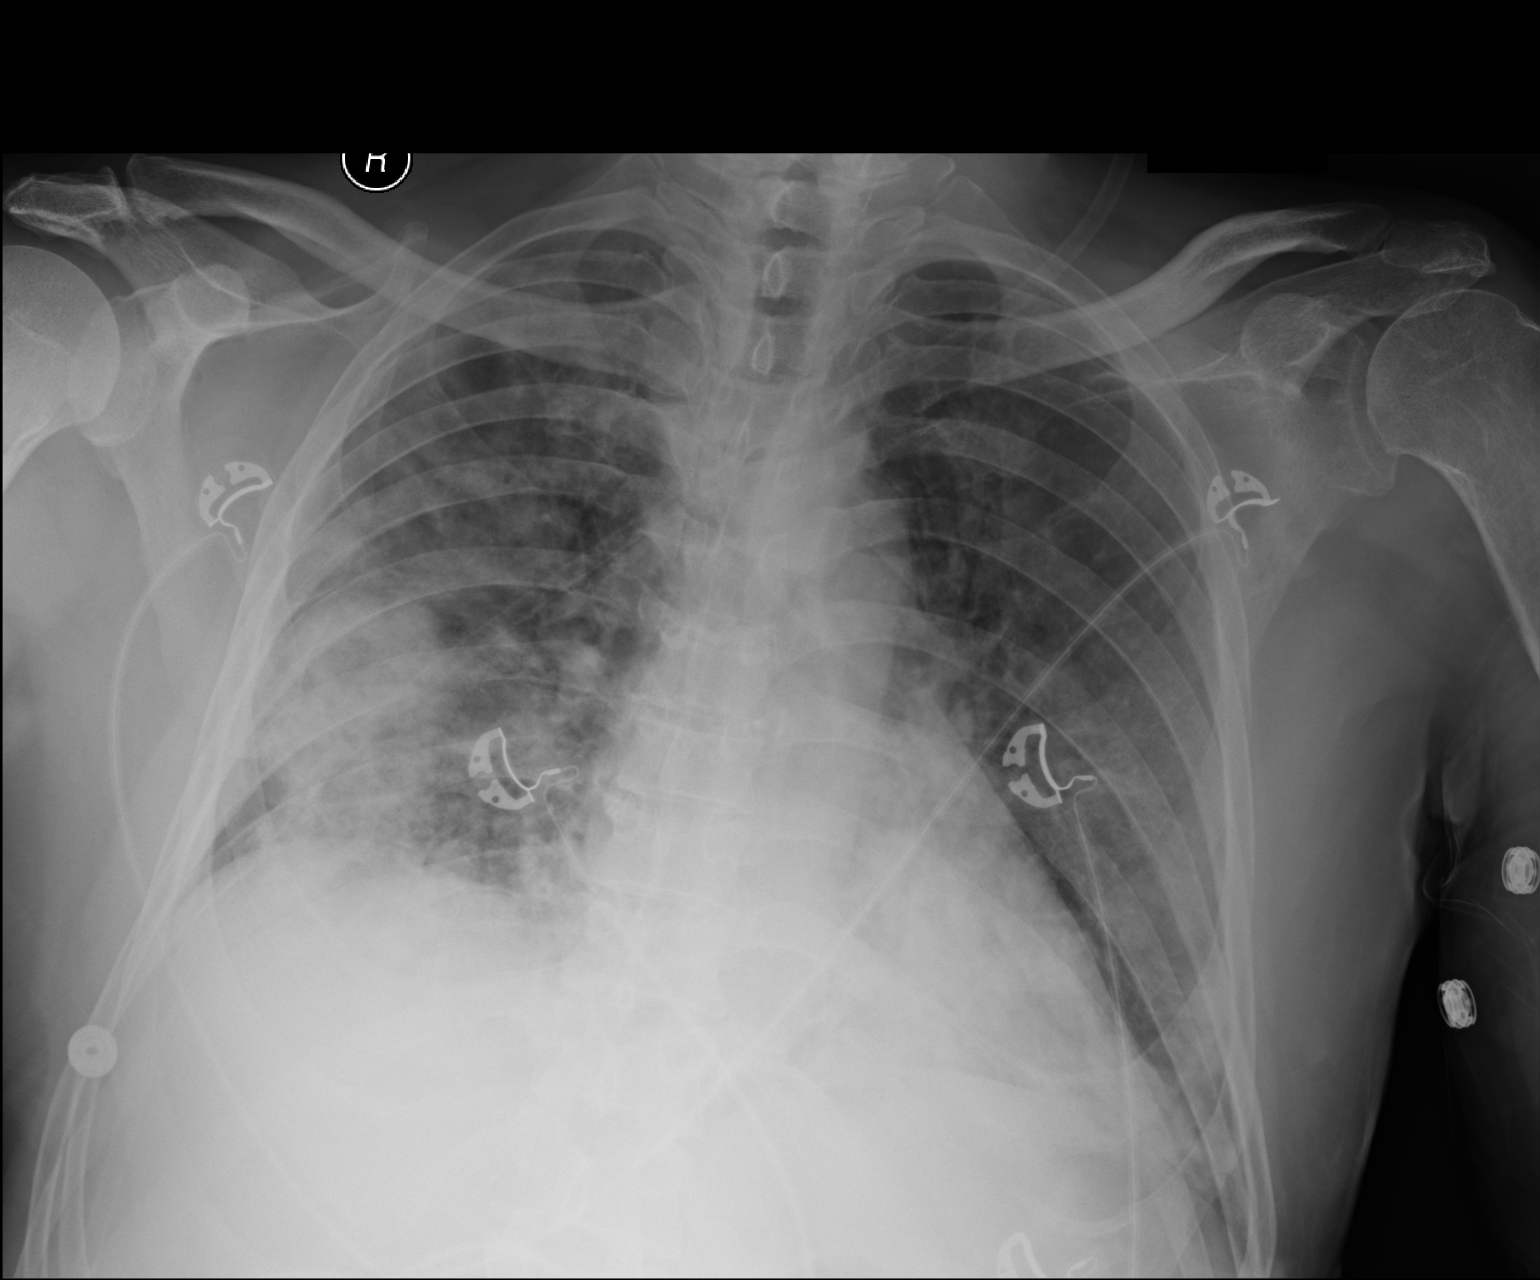

[1 of 1 positions shown; findings below may reference images not displayed]

FINDINGS: Worsening right lower lobe airspace disease and left lower lobe
airspace disease. Patchy areas of right upper lobe airspace disease.
No pleural effusion or pneumothorax. Stable cardiomediastinal
silhouette. No acute osseous abnormality.
IMPRESSION: 1. Worsening bilateral lower lobe pneumonia. Patchy areas of right
upper lobe airspace disease concerning for pneumonia.

## 2020-04-19 IMAGING — DX PORTABLE CHEST - 1 VIEW
1 series · 1 of 1 positions shown · non-contrast
Comparison: 08/09/2018

CLINICAL DATA: Endotracheal tube and central line placement.
[id number] positive.

EXAM:
PORTABLE CHEST 1 VIEW

[chest ap]
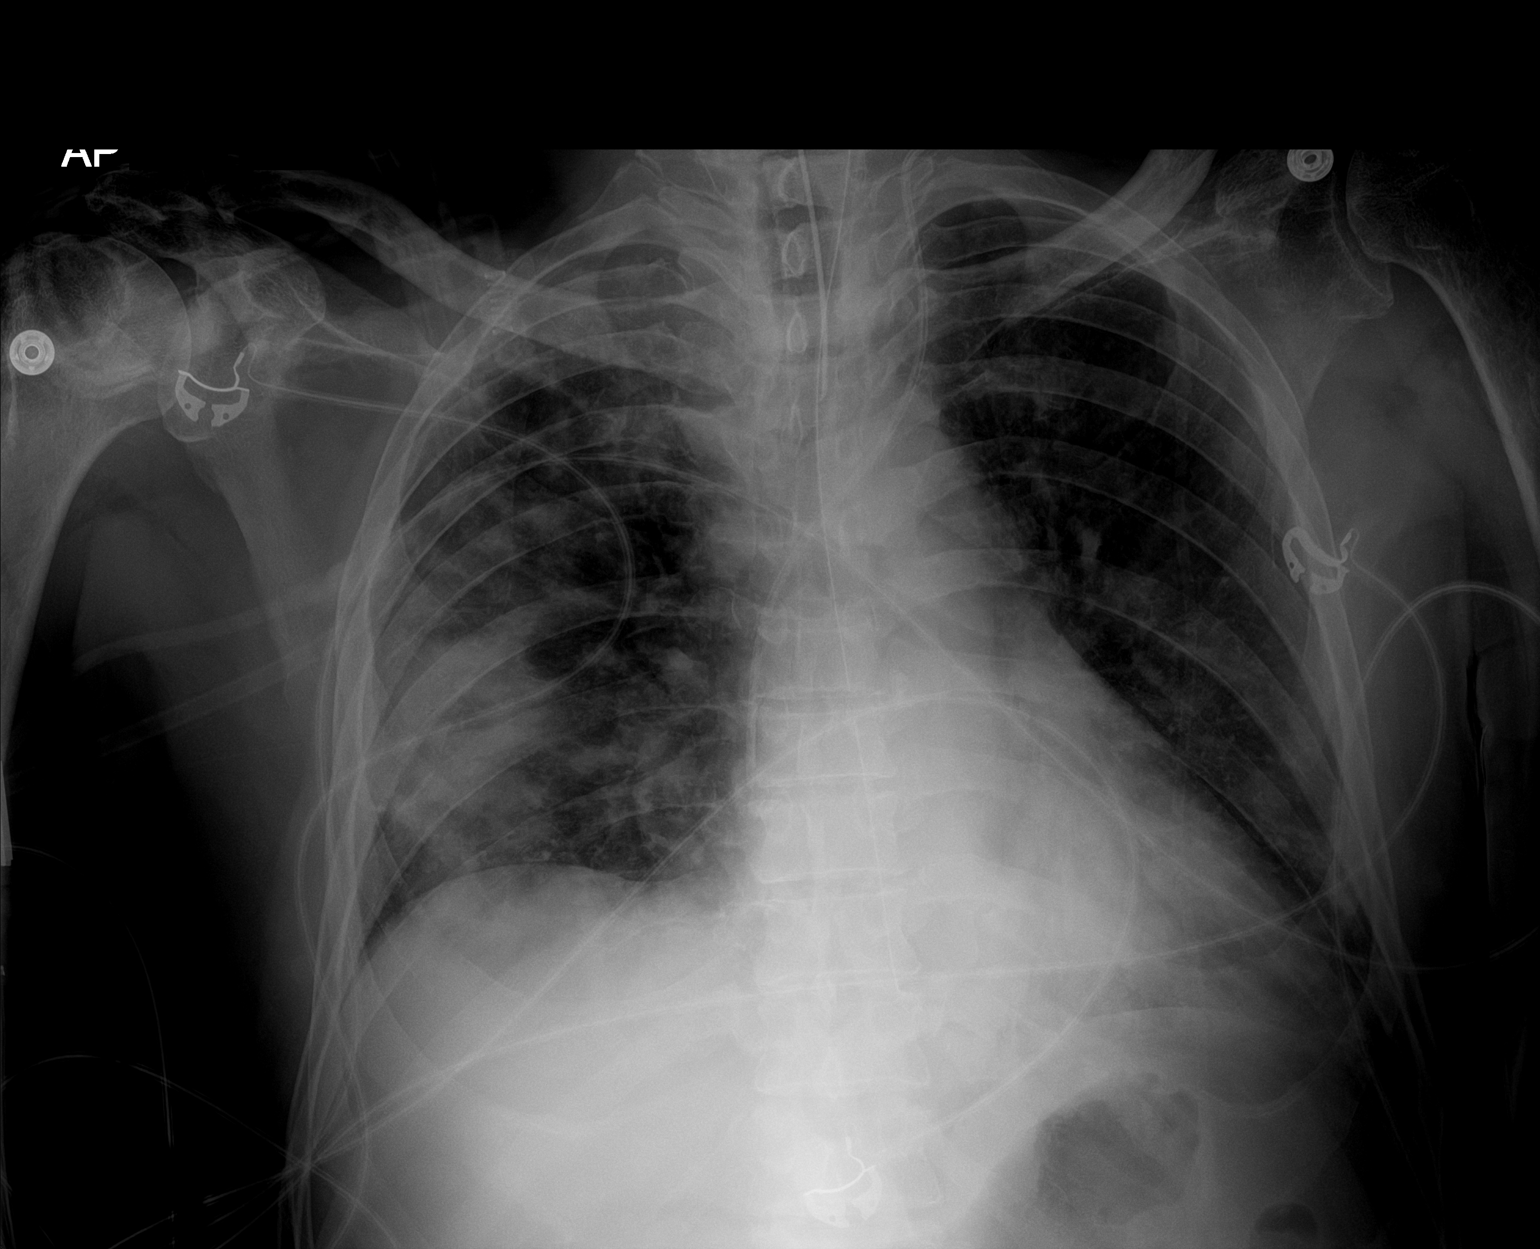

[1 of 1 positions shown; findings below may reference images not displayed]

FINDINGS: An endotracheal tube has been placed and terminates 5 cm above the
carina. A new left jugular catheter terminates over the lower SVC.
The cardiomediastinal silhouette is unchanged with normal heart
size. Patchy bilateral airspace consolidation, greatest in the lower
lungs, has not significantly changed. No sizable pleural effusion or
pneumothorax is identified.
IMPRESSION: 1. Interval endotracheal tube and central line placement as above.
2. Unchanged bilateral pneumonia.

## 2020-04-19 IMAGING — DX ABDOMEN - 1 VIEW
1 series · 1 of 1 positions shown · non-contrast
Comparison: None.

CLINICAL DATA: Orogastric tube placement.

EXAM:
ABDOMEN - 1 VIEW

[abdomen kub]
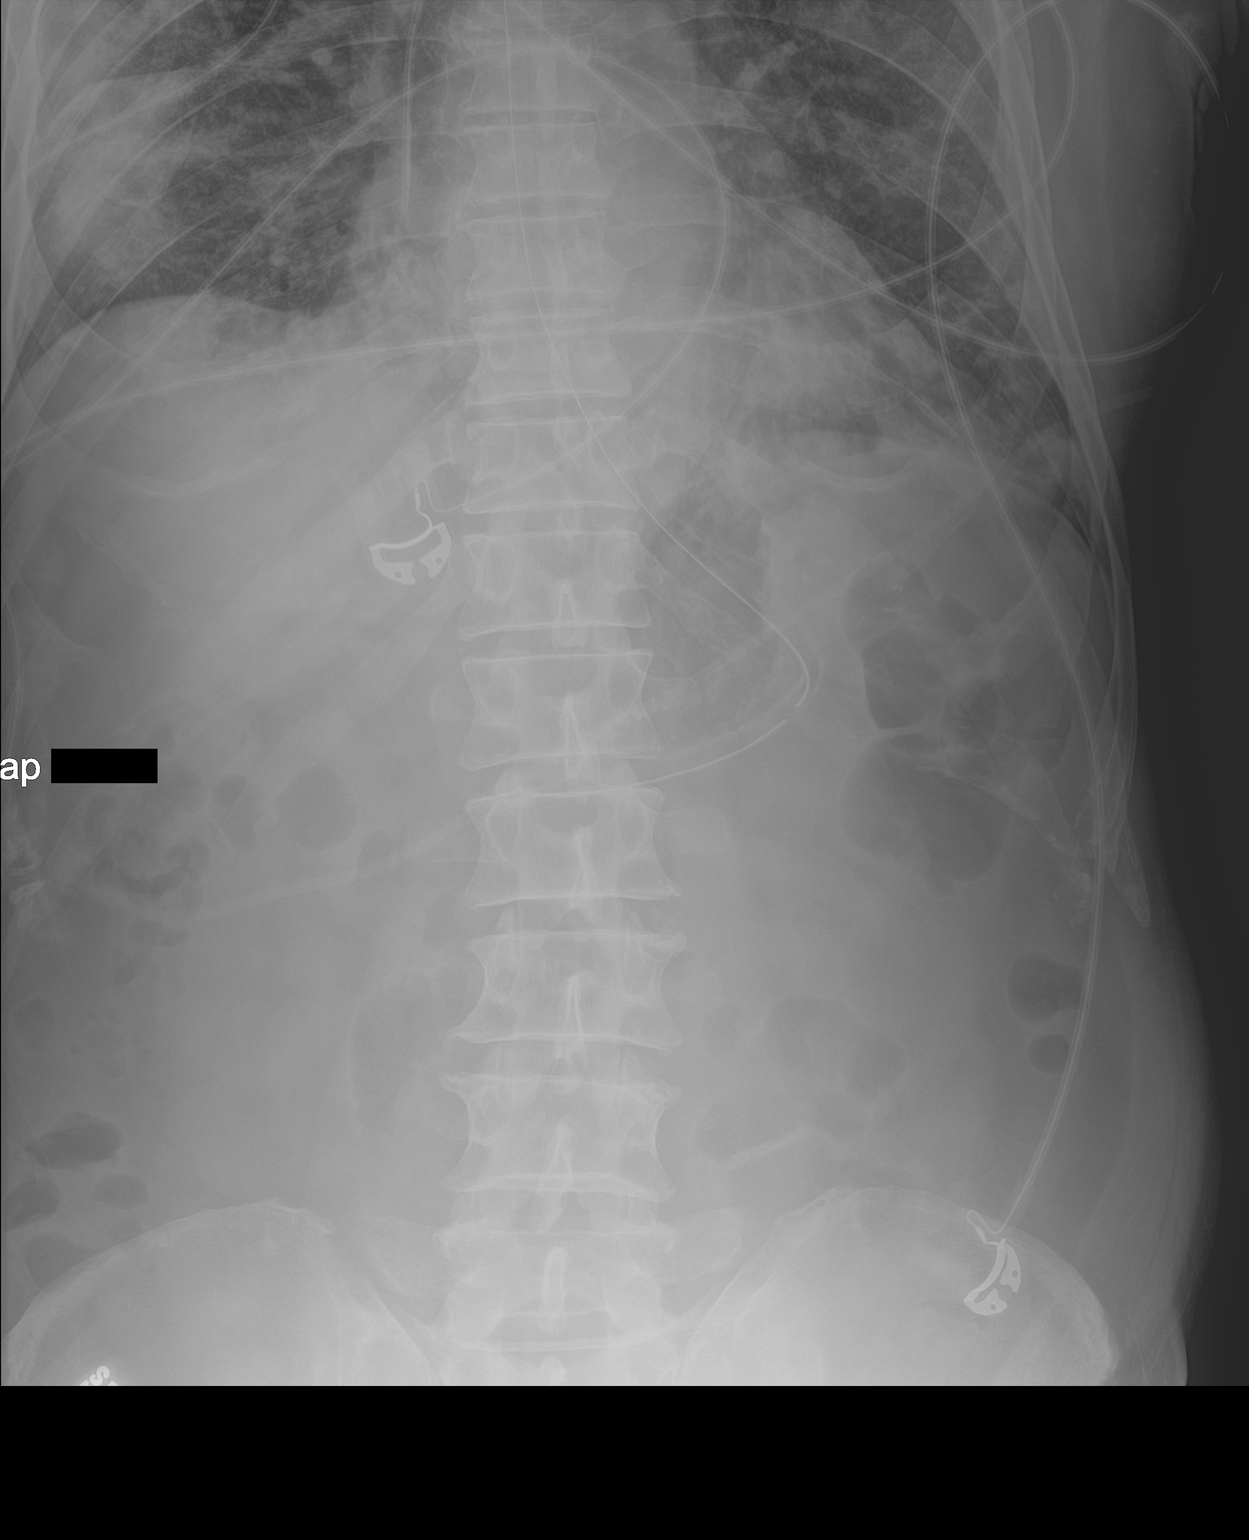

[1 of 1 positions shown; findings below may reference images not displayed]

FINDINGS: The bowel gas pattern is normal. Distal tip of orogastric tube is
seen in expected position of distal stomach. No radio-opaque calculi
or other significant radiographic abnormality are seen.
IMPRESSION: Distal tip of orogastric tube is seen in expected position of distal
stomach. No evidence of bowel obstruction or ileus.

## 2020-04-25 ENCOUNTER — Other Ambulatory Visit: Payer: Self-pay | Admitting: Nurse Practitioner

## 2020-04-25 DIAGNOSIS — R42 Dizziness and giddiness: Secondary | ICD-10-CM

## 2020-04-25 MED FILL — HYDROCHLOROTHIAZIDE 12.5 MG: 12.5 | 30 days supply | Qty: 30 | Fill #1

## 2020-04-25 MED FILL — ROSUVASTATIN CALCIUM 20 MG: 20 | 30 days supply | Qty: 30 | Fill #1

## 2020-04-25 MED FILL — FLUoxetine HCL 20 MG TABS: 20 | 30 days supply | Qty: 30 | Fill #1

## 2020-04-25 MED FILL — CLOPIDOGREL 75 MG TABLET: 75 | 30 days supply | Qty: 30 | Fill #1

## 2020-04-25 MED FILL — traZODone HCL 50 MG TABS: 50 | 30 days supply | Qty: 30 | Fill #0

## 2020-04-25 NOTE — Telephone Encounter (Signed)
Crystal - please advise on refill. Thank you.

## 2020-04-26 ENCOUNTER — Other Ambulatory Visit: Payer: Self-pay | Admitting: Nurse Practitioner

## 2020-04-28 MED FILL — MECLIZINE 25 MG TABLET: 25 | 20 days supply | Qty: 60 | Fill #0

## 2020-05-16 MED FILL — MECLIZINE 25 MG TABLET: 25 | 20 days supply | Qty: 60 | Fill #0

## 2020-05-27 MED FILL — ROSUVASTATIN CALCIUM 20 MG: 20 | 30 days supply | Qty: 30 | Fill #2

## 2020-05-27 MED FILL — FLUoxetine HCL 20 MG TABS: 20 | 30 days supply | Qty: 30 | Fill #2

## 2020-05-27 MED FILL — MECLIZINE 25 MG TABLET: 25 | 20 days supply | Qty: 60 | Fill #0

## 2020-05-27 MED FILL — CLOPIDOGREL 75 MG TABLET: 75 | 30 days supply | Qty: 30 | Fill #2

## 2020-05-27 MED FILL — traZODone HCL 50 MG TABS: 50 | 30 days supply | Qty: 30 | Fill #1

## 2020-05-27 MED FILL — HYDROCHLOROTHIAZIDE 12.5 MG: 12.5 | 30 days supply | Qty: 30 | Fill #2

## 2020-06-05 ENCOUNTER — Ambulatory Visit: Payer: Self-pay | Admitting: Nurse Practitioner

## 2020-06-26 ENCOUNTER — Other Ambulatory Visit: Payer: Self-pay | Admitting: Nurse Practitioner

## 2020-06-26 DIAGNOSIS — R42 Dizziness and giddiness: Secondary | ICD-10-CM

## 2020-06-26 MED FILL — FLUoxetine HCL 20 MG TABS: 20 | 30 days supply | Qty: 30 | Fill #3

## 2020-06-26 MED FILL — ROSUVASTATIN CALCIUM 20 MG: 20 | 30 days supply | Qty: 30 | Fill #3

## 2020-06-26 MED FILL — CLOPIDOGREL 75 MG TABLET: 75 | 30 days supply | Qty: 30 | Fill #3

## 2020-06-26 MED FILL — traZODone HCL 50 MG TABS: 50 | 30 days supply | Qty: 30 | Fill #2

## 2020-06-26 MED FILL — HYDROCHLOROTHIAZIDE 12.5 MG: 12.5 | 30 days supply | Qty: 30 | Fill #3

## 2020-06-26 MED FILL — MECLIZINE 25 MG TABLET: 25 | 20 days supply | Qty: 60 | Fill #0

## 2020-07-07 MED FILL — MECLIZINE 25 MG TABLET: 25 | 20 days supply | Qty: 60 | Fill #0

## 2020-07-28 ENCOUNTER — Other Ambulatory Visit: Payer: Self-pay

## 2020-07-28 ENCOUNTER — Other Ambulatory Visit: Payer: Self-pay | Admitting: Nurse Practitioner

## 2020-07-28 DIAGNOSIS — R42 Dizziness and giddiness: Secondary | ICD-10-CM

## 2020-07-28 MED FILL — Clopidogrel Bisulfate Tab 75 MG (Base Equiv): ORAL | 30 days supply | Qty: 30 | Fill #0 | Status: AC

## 2020-07-28 MED FILL — Hydrochlorothiazide Cap 12.5 MG: ORAL | 30 days supply | Qty: 30 | Fill #0 | Status: AC

## 2020-07-28 MED FILL — Fluoxetine HCl Tab 20 MG: ORAL | 30 days supply | Qty: 30 | Fill #0 | Status: AC

## 2020-07-28 MED FILL — Rosuvastatin Calcium Tab 20 MG: ORAL | 30 days supply | Qty: 30 | Fill #0 | Status: AC

## 2020-08-11 ENCOUNTER — Other Ambulatory Visit: Payer: Self-pay

## 2020-08-11 ENCOUNTER — Other Ambulatory Visit: Payer: Self-pay | Admitting: Nurse Practitioner

## 2020-08-11 DIAGNOSIS — R42 Dizziness and giddiness: Secondary | ICD-10-CM

## 2020-08-13 MED ORDER — MECLIZINE HCL 25 MG PO TABS
ORAL_TABLET | ORAL | 11 refills | Status: DC
  Filled 2020-08-13 – 2020-08-25 (×2): qty 60, 20d supply, fill #0
  Filled 2020-09-24: qty 60, 20d supply, fill #1
  Filled 2020-10-27: qty 60, 20d supply, fill #2
  Filled 2020-11-24: qty 60, 20d supply, fill #3
  Filled 2020-12-23: qty 60, 20d supply, fill #4
  Filled 2021-01-26: qty 60, 20d supply, fill #5
  Filled 2021-02-27: qty 60, 20d supply, fill #6
  Filled 2021-03-27: qty 60, 20d supply, fill #7

## 2020-08-14 ENCOUNTER — Other Ambulatory Visit: Payer: Self-pay

## 2020-08-21 ENCOUNTER — Other Ambulatory Visit: Payer: Self-pay

## 2020-08-22 ENCOUNTER — Other Ambulatory Visit: Payer: Self-pay

## 2020-08-22 ENCOUNTER — Ambulatory Visit: Payer: Self-pay | Attending: Family Medicine

## 2020-08-25 ENCOUNTER — Other Ambulatory Visit: Payer: Self-pay

## 2020-08-25 MED FILL — Hydrochlorothiazide Cap 12.5 MG: ORAL | 30 days supply | Qty: 30 | Fill #1 | Status: AC

## 2020-08-25 MED FILL — Fluoxetine HCl Tab 20 MG: ORAL | 30 days supply | Qty: 30 | Fill #1 | Status: AC

## 2020-08-25 MED FILL — Rosuvastatin Calcium Tab 20 MG: ORAL | 30 days supply | Qty: 30 | Fill #1 | Status: AC

## 2020-08-25 MED FILL — Clopidogrel Bisulfate Tab 75 MG (Base Equiv): ORAL | 30 days supply | Qty: 30 | Fill #1 | Status: AC

## 2020-09-24 ENCOUNTER — Other Ambulatory Visit: Payer: Self-pay

## 2020-09-24 MED FILL — Hydrochlorothiazide Cap 12.5 MG: ORAL | 30 days supply | Qty: 30 | Fill #2 | Status: AC

## 2020-09-24 MED FILL — Fluoxetine HCl Tab 20 MG: ORAL | 30 days supply | Qty: 30 | Fill #2 | Status: AC

## 2020-09-24 MED FILL — Rosuvastatin Calcium Tab 20 MG: ORAL | 30 days supply | Qty: 30 | Fill #2 | Status: AC

## 2020-09-24 MED FILL — Clopidogrel Bisulfate Tab 75 MG (Base Equiv): ORAL | 30 days supply | Qty: 30 | Fill #2 | Status: AC

## 2020-09-25 ENCOUNTER — Other Ambulatory Visit: Payer: Self-pay

## 2020-10-27 ENCOUNTER — Other Ambulatory Visit: Payer: Self-pay

## 2020-10-27 MED FILL — Hydrochlorothiazide Cap 12.5 MG: ORAL | 30 days supply | Qty: 30 | Fill #3 | Status: AC

## 2020-10-27 MED FILL — Olopatadine HCl Ophth Soln 0.2% (Base Equivalent): OPHTHALMIC | 30 days supply | Qty: 2.5 | Fill #0 | Status: AC

## 2020-10-27 MED FILL — Rosuvastatin Calcium Tab 20 MG: ORAL | 30 days supply | Qty: 30 | Fill #3 | Status: AC

## 2020-10-27 MED FILL — Clopidogrel Bisulfate Tab 75 MG (Base Equiv): ORAL | 30 days supply | Qty: 30 | Fill #3 | Status: AC

## 2020-10-27 MED FILL — Fluoxetine HCl Tab 20 MG: ORAL | 30 days supply | Qty: 30 | Fill #3 | Status: AC

## 2020-10-27 MED FILL — Fluoxetine HCl Tab 20 MG: ORAL | 30 days supply | Qty: 30 | Fill #3 | Status: CN

## 2020-11-24 ENCOUNTER — Other Ambulatory Visit: Payer: Self-pay

## 2020-11-24 MED FILL — Clopidogrel Bisulfate Tab 75 MG (Base Equiv): ORAL | 30 days supply | Qty: 30 | Fill #4 | Status: AC

## 2020-11-24 MED FILL — Rosuvastatin Calcium Tab 20 MG: ORAL | 30 days supply | Qty: 30 | Fill #4 | Status: AC

## 2020-11-24 MED FILL — Hydrochlorothiazide Cap 12.5 MG: ORAL | 30 days supply | Qty: 30 | Fill #4 | Status: AC

## 2020-11-24 MED FILL — Fluoxetine HCl Tab 20 MG: ORAL | 30 days supply | Qty: 30 | Fill #4 | Status: AC

## 2020-11-28 ENCOUNTER — Other Ambulatory Visit: Payer: Self-pay

## 2020-11-28 MED FILL — Olopatadine HCl Ophth Soln 0.2% (Base Equivalent): OPHTHALMIC | 30 days supply | Qty: 2.5 | Fill #1 | Status: AC

## 2020-12-23 ENCOUNTER — Other Ambulatory Visit: Payer: Self-pay

## 2020-12-23 MED FILL — Hydrochlorothiazide Cap 12.5 MG: ORAL | 30 days supply | Qty: 30 | Fill #5 | Status: AC

## 2020-12-23 MED FILL — Clopidogrel Bisulfate Tab 75 MG (Base Equiv): ORAL | 30 days supply | Qty: 30 | Fill #5 | Status: AC

## 2020-12-23 MED FILL — Olopatadine HCl Ophth Soln 0.2% (Base Equivalent): OPHTHALMIC | 30 days supply | Qty: 2.5 | Fill #2 | Status: AC

## 2020-12-23 MED FILL — Rosuvastatin Calcium Tab 20 MG: ORAL | 30 days supply | Qty: 30 | Fill #5 | Status: AC

## 2020-12-23 MED FILL — Fluoxetine HCl Tab 20 MG: ORAL | 30 days supply | Qty: 30 | Fill #5 | Status: AC

## 2021-01-26 ENCOUNTER — Other Ambulatory Visit: Payer: Self-pay

## 2021-01-26 MED FILL — Clopidogrel Bisulfate Tab 75 MG (Base Equiv): ORAL | 30 days supply | Qty: 30 | Fill #6 | Status: AC

## 2021-01-26 MED FILL — Hydrochlorothiazide Cap 12.5 MG: ORAL | 30 days supply | Qty: 30 | Fill #6 | Status: AC

## 2021-01-26 MED FILL — Rosuvastatin Calcium Tab 20 MG: ORAL | 30 days supply | Qty: 30 | Fill #6 | Status: AC

## 2021-01-26 MED FILL — Fluoxetine HCl Tab 20 MG: ORAL | 30 days supply | Qty: 30 | Fill #6 | Status: AC

## 2021-02-27 ENCOUNTER — Other Ambulatory Visit: Payer: Self-pay

## 2021-02-27 MED FILL — Clopidogrel Bisulfate Tab 75 MG (Base Equiv): ORAL | 30 days supply | Qty: 30 | Fill #7 | Status: AC

## 2021-02-27 MED FILL — Fluoxetine HCl Tab 20 MG: ORAL | 30 days supply | Qty: 30 | Fill #7 | Status: AC

## 2021-02-27 MED FILL — Rosuvastatin Calcium Tab 20 MG: ORAL | 30 days supply | Qty: 30 | Fill #7 | Status: AC

## 2021-02-27 MED FILL — Hydrochlorothiazide Cap 12.5 MG: ORAL | 30 days supply | Qty: 30 | Fill #7 | Status: AC

## 2021-03-27 ENCOUNTER — Other Ambulatory Visit: Payer: Self-pay

## 2021-03-27 ENCOUNTER — Other Ambulatory Visit: Payer: Self-pay | Admitting: Nurse Practitioner

## 2021-03-27 DIAGNOSIS — F320 Major depressive disorder, single episode, mild: Secondary | ICD-10-CM

## 2021-03-27 DIAGNOSIS — K219 Gastro-esophageal reflux disease without esophagitis: Secondary | ICD-10-CM

## 2021-03-27 DIAGNOSIS — I10 Essential (primary) hypertension: Secondary | ICD-10-CM

## 2021-03-27 DIAGNOSIS — I639 Cerebral infarction, unspecified: Secondary | ICD-10-CM

## 2021-03-30 ENCOUNTER — Ambulatory Visit (INDEPENDENT_AMBULATORY_CARE_PROVIDER_SITE_OTHER): Payer: Self-pay | Admitting: Nurse Practitioner

## 2021-03-30 ENCOUNTER — Other Ambulatory Visit: Payer: Self-pay

## 2021-03-30 ENCOUNTER — Encounter: Payer: Self-pay | Admitting: Nurse Practitioner

## 2021-03-30 DIAGNOSIS — I639 Cerebral infarction, unspecified: Secondary | ICD-10-CM

## 2021-03-30 DIAGNOSIS — I10 Essential (primary) hypertension: Secondary | ICD-10-CM

## 2021-03-30 DIAGNOSIS — R42 Dizziness and giddiness: Secondary | ICD-10-CM

## 2021-03-30 DIAGNOSIS — K219 Gastro-esophageal reflux disease without esophagitis: Secondary | ICD-10-CM

## 2021-03-30 DIAGNOSIS — F320 Major depressive disorder, single episode, mild: Secondary | ICD-10-CM

## 2021-03-30 LAB — POCT GLYCOSYLATED HEMOGLOBIN (HGB A1C)
HbA1c POC (<> result, manual entry): 5.7 % (ref 4.0–5.6)
HbA1c, POC (controlled diabetic range): 5.7 % (ref 0.0–7.0)
HbA1c, POC (prediabetic range): 5.7 % (ref 5.7–6.4)
Hemoglobin A1C: 5.7 % — AB (ref 4.0–5.6)

## 2021-03-30 MED ORDER — ROSUVASTATIN CALCIUM 20 MG PO TABS
ORAL_TABLET | Freq: Every day | ORAL | 3 refills | Status: DC
  Filled 2021-03-30 – 2021-04-29 (×2): qty 30, 30d supply, fill #0
  Filled 2021-05-29: qty 30, 30d supply, fill #1
  Filled 2021-06-30: qty 30, 30d supply, fill #2
  Filled 2021-07-31: qty 30, 30d supply, fill #3
  Filled 2021-09-03: qty 30, 30d supply, fill #4
  Filled 2021-10-07: qty 30, 30d supply, fill #5
  Filled 2021-11-05: qty 30, 30d supply, fill #6
  Filled 2021-12-09: qty 30, 30d supply, fill #7
  Filled 2022-01-24: qty 30, 30d supply, fill #8
  Filled 2022-03-07: qty 30, 30d supply, fill #9

## 2021-03-30 MED ORDER — FLUOXETINE HCL 20 MG PO TABS
ORAL_TABLET | ORAL | 3 refills | Status: DC
  Filled 2021-03-30 – 2021-04-29 (×2): qty 30, 30d supply, fill #0
  Filled 2021-05-29: qty 30, 30d supply, fill #1
  Filled 2021-06-30: qty 30, 30d supply, fill #2
  Filled 2021-07-31: qty 30, 30d supply, fill #3
  Filled 2021-09-03: qty 30, 30d supply, fill #4
  Filled 2021-10-07: qty 30, 30d supply, fill #5
  Filled 2021-11-05: qty 30, 30d supply, fill #6
  Filled 2021-12-09: qty 30, 30d supply, fill #7
  Filled 2022-01-24: qty 30, 30d supply, fill #8
  Filled 2022-03-07: qty 30, 30d supply, fill #9

## 2021-03-30 MED ORDER — FAMOTIDINE 20 MG PO TABS
20.0000 mg | ORAL_TABLET | Freq: Two times a day (BID) | ORAL | 3 refills | Status: AC
  Filled 2021-03-30 – 2021-04-29 (×2): qty 60, 30d supply, fill #0
  Filled 2021-05-29: qty 60, 30d supply, fill #1
  Filled 2021-06-30: qty 60, 30d supply, fill #2
  Filled 2021-07-31: qty 60, 30d supply, fill #3
  Filled 2021-09-03: qty 60, 30d supply, fill #4
  Filled 2021-10-07: qty 60, 30d supply, fill #5
  Filled 2021-11-05: qty 60, 30d supply, fill #6
  Filled 2021-12-09: qty 60, 30d supply, fill #7
  Filled 2022-01-24: qty 60, 30d supply, fill #8
  Filled 2022-03-07: qty 60, 30d supply, fill #9

## 2021-03-30 MED ORDER — MECLIZINE HCL 50 MG PO TABS
50.0000 mg | ORAL_TABLET | Freq: Two times a day (BID) | ORAL | 2 refills | Status: DC | PRN
  Filled 2021-03-30: qty 30, 15d supply, fill #0

## 2021-03-30 MED ORDER — HYDROCHLOROTHIAZIDE 12.5 MG PO CAPS
12.5000 mg | ORAL_CAPSULE | Freq: Every day | ORAL | 3 refills | Status: DC
  Filled 2021-03-30 – 2021-04-29 (×2): qty 30, 30d supply, fill #0
  Filled 2021-05-29: qty 30, 30d supply, fill #1
  Filled 2021-06-30: qty 30, 30d supply, fill #2
  Filled 2021-07-31: qty 30, 30d supply, fill #3
  Filled 2021-09-03: qty 30, 30d supply, fill #4
  Filled 2021-10-07: qty 30, 30d supply, fill #5
  Filled 2021-11-05: qty 30, 30d supply, fill #6
  Filled 2021-12-09: qty 30, 30d supply, fill #7
  Filled 2022-01-24: qty 30, 30d supply, fill #8
  Filled 2022-03-07: qty 30, 30d supply, fill #9

## 2021-03-30 NOTE — Patient Instructions (Signed)
You were seen today in the [facility] for annual wellness, dizziness and medication refills. Labs were collected, results will be available via MyChart or, if abnormal, you will be contacted by clinic staff. You were prescribed medications, please take as directed. Please follow up in 3 mths for reevaluation of dizziness.

## 2021-03-30 NOTE — Progress Notes (Signed)
[place] [place], [place]  [address] Phone:  [phone]   Fax:  [phone] Subjective:   Patient ID: Mark Robles, male    DOB: [birth year], 71 y.o.   MRN: [id number]  Chief Complaint  Patient presents with   Follow-up    Pt is here for FU. Pt had question about his med's pt stated he has a lot of dizziness it makes it hard for him to sleep   HPI Mark Robles 70 y.o. male  has a past medical history of Depression, DVT (deep venous thrombosis) ([place]), Hyperlipidemia, Left-sided sensory deficit present, Seizure ([place]), and Stroke ([place]).  To the [facility] for reevaluation of dizziness, medication refill and wellness exam. Patient accompanied by wife who is his primary caregiver.  Wife verbalizes concern about patient's dizziness. States that patient has had continued dizziness since stroke in 2011. Has been taking the same dose of Meclizine since prescribed. Wife states that patient symptoms would likely be worse without the medication, but they are wanting the dizziness to be resolved. Experiences dizziness when walking and looking down. Last evaluation by neurologist and cardiologist 2 yrs ago. Also has not been to PT in several years. Patients wife states that they have not had insurance. Verbalizes interest in referrals to specialist now that they have insurance.  Monitors meals at home, but is not very active during the day. Wife states that patient watches TV most of the day. She currently works, but states that patient cares for himself at home without complication. Verbalizes patient having some problems with short term memory, but generally has no issues with cognition post surgery. Post stroke weakness localized to left arm, patient uses cane for assistance with ambulation. Currently compliant with all medications. Denies any other complaints today.  Denies any fever. Denies any fatigue, chest pain, shortness of  breath, HA or dizziness. Denies any blurred vision, numbness or tingling.  Past Medical History:  Diagnosis Date   Depression    DVT (deep venous thrombosis) (HCC)    Hyperlipidemia    Left-sided sensory deficit present    Seizure (HCC)    Stroke [facility])     Past Surgical History:  Procedure Laterality Date   NO PAST SURGERIES      Family History  Problem Relation Age of Onset   Hypertension Mother        tachycardia, pacemaker   Hypertension Sister    Hypertension Brother    Hypertension Maternal Uncle     Social History   Socioeconomic History   Marital status: Married    Spouse name: Not on file   Number of children: 3   Years of education: 6th grade   Highest education level: Not on file  Occupational History   Not on file  Tobacco Use   Smoking status: Never   Smokeless tobacco: Never  Vaping Use   Vaping Use: Never used  Substance and Sexual Activity   Alcohol use: No   Drug use: No   Sexual activity: Not Currently  Other Topics Concern   Not on file  Social History Narrative   Lives at home with wife & 1 child   Right handed   No caffeine    Social Determinants of Health   Financial Resource Strain: Not on file  Food Insecurity: Not on file  Transportation Needs: Not on file  Physical Activity: Not on file  Stress: Not on file  Social Connections: Not on file  Intimate Partner Violence: Not on file    Outpatient Medications Prior to Visit  Medication Sig Dispense Refill   meclizine (ANTIVERT) 25 MG tablet TAKE 1 TABLET BY MOUTH 3 TIMES DAILY AS NEEDED FOR DIZZINESS. 60 tablet 11   fluticasone (FLONASE) 50 MCG/ACT nasal spray Place 2 sprays into both nostrils daily. 16 g 6   hydrochlorothiazide (MICROZIDE) 12.5 MG capsule TAKE 1 TABLET (12.5 MG TOTAL) BY MOUTH DAILY. 90 capsule 3   loperamide (IMODIUM) 2 MG capsule TAKE 2 CAPSULES BY MOUTH INITIALLY THEN 1 CAPSULE AFTER EACH LOOSE STOOL (DO NOT EXCEED 8 CAPSULES IN 24 HOURS)  0   traZODone  (DESYREL) 50 MG tablet TAKE 0.5-1 TABLETS (25-50 MG TOTAL) BY MOUTH AT BEDTIME. 90 tablet 3   famotidine (PEPCID) 20 MG tablet Take 1 tablet (20 mg total) by mouth 2 (two) times daily. 180 tablet 3   FLUoxetine (PROZAC) 20 MG tablet TOME 1 PASTILLA POR VIA ORAL CADA MANANA 90 tablet 3   hydrochlorothiazide (HYDRODIURIL) 12.5 MG tablet Take 1 tablet (12.5 mg total) by mouth daily. 90 tablet 3   rosuvastatin (CRESTOR) 20 MG tablet TAKE 1 TABLET (20 MG TOTAL) BY MOUTH DAILY. 90 tablet 3   No facility-administered medications prior to visit.    Allergies  Allergen Reactions   Amlodipine Itching and Swelling    Review of Systems  Constitutional:  Negative for chills, fever and malaise/fatigue.  HENT: Negative.    Eyes: Negative.   Respiratory:  Negative for cough and shortness of breath.   Cardiovascular:  Negative for chest pain, palpitations and leg swelling.  Gastrointestinal:  Negative for abdominal pain, blood in stool, constipation, diarrhea, nausea and vomiting.  Genitourinary: Negative.   Musculoskeletal: Negative.   Skin: Negative.   Neurological:  Positive for dizziness and focal weakness.  Psychiatric/Behavioral:  Positive for memory loss. Negative for depression. The patient is not nervous/anxious.   All other systems reviewed and are negative.     Objective:    Physical Exam Vitals reviewed.  Constitutional:      General: He is not in acute distress.    Appearance: Normal appearance. He is normal weight.  HENT:     Head: Normocephalic.     Right Ear: Tympanic membrane, ear canal and external ear normal.     Left Ear: Tympanic membrane, ear canal and external ear normal.     Nose: Nose normal.     Mouth/Throat:     Mouth: Mucous membranes are moist.     Pharynx: Oropharynx is clear.  Eyes:     Extraocular Movements: Extraocular movements intact.     Conjunctiva/sclera: Conjunctivae normal.     Pupils: Pupils are equal, round, and reactive to light.  Neck:      Vascular: No carotid bruit.  Cardiovascular:     Rate and Rhythm: Normal rate and regular rhythm.     Pulses: Normal pulses.     Heart sounds: Normal heart sounds.     Comments: No obvious peripheral edema Pulmonary:     Effort: Pulmonary effort is normal.     Breath sounds: Normal breath sounds.  Abdominal:     General: Abdomen is flat. Bowel sounds are normal. There is no distension.     Palpations: Abdomen is soft. There is no mass.     Tenderness: There is no abdominal tenderness. There is no right CVA tenderness, left CVA tenderness, guarding or rebound.     Hernia: No hernia is present.  Musculoskeletal:  General: No swelling, tenderness or signs of injury.     Cervical back: Normal range of motion and neck supple. No rigidity or tenderness.     Right lower leg: No edema.     Left lower leg: No edema.  Lymphadenopathy:     Cervical: No cervical adenopathy.  Skin:    General: Skin is warm and dry.     Capillary Refill: Capillary refill takes less than 2 seconds.  Neurological:     Mental Status: He is alert and oriented to person, place, and time.     Gait: Gait abnormal.     Comments: Chronic LUE weakness  Psychiatric:        Mood and Affect: Mood normal.        Behavior: Behavior normal.        Thought Content: Thought content normal.        Judgment: Judgment normal.    BP 125/77   Pulse 65   Temp (!) 97.5 F (36.4 C)   Ht 5' 4.96" (1.65 m)   Wt 192 lb 2 oz (87.1 kg)   SpO2 98%   BMI 32.01 kg/m  Wt Readings from Last 3 Encounters:  03/30/21 192 lb 2 oz (87.1 kg)  03/06/20 183 lb 3.2 oz (83.1 kg)  08/02/19 180 lb 9.6 oz (81.9 kg)    Immunization History  Administered Date(s) Administered   Pneumococcal Conjugate-13 07/13/2018   Pneumococcal Polysaccharide-23 05/16/2017   Tdap 05/16/2017   Unspecified SARS-COV-2 Vaccination 05/30/2019, 07/25/2019    Diabetic Foot Exam - Simple   No data filed     Lab Results  Component Value Date   TSH  3.130 03/06/2020   Lab Results  Component Value Date   WBC 4.0 08/02/2019   HGB 16.5 08/02/2019   HCT 49.7 08/02/2019   MCV 97 08/02/2019   PLT 219 08/02/2019   Lab Results  Component Value Date   NA 139 03/06/2020   K 4.0 03/06/2020   CO2 23 10/13/2018   GLUCOSE 96 03/06/2020   BUN 11 03/06/2020   CREATININE 0.79 03/06/2020   BILITOT 0.4 03/06/2020   ALKPHOS 42 (L) 03/06/2020   AST 26 03/06/2020   ALT 25 10/13/2018   PROT 7.3 03/06/2020   ALBUMIN 4.6 03/06/2020   CALCIUM 9.1 03/06/2020   ANIONGAP 7 08/16/2018   Lab Results  Component Value Date   CHOL 182 03/06/2020   CHOL 157 08/02/2019   CHOL 195 05/04/2019   Lab Results  Component Value Date   HDL 58 03/06/2020   HDL 61 08/02/2019   HDL 57 05/04/2019   Lab Results  Component Value Date   LDLCALC 103 (H) 03/06/2020   LDLCALC 76 08/02/2019   LDLCALC 108 (H) 05/04/2019   Lab Results  Component Value Date   TRIG 116 03/06/2020   TRIG 112 08/02/2019   TRIG 176 (H) 05/04/2019   Lab Results  Component Value Date   CHOLHDL 3.1 03/06/2020   CHOLHDL 2.6 08/02/2019   CHOLHDL 3.4 05/04/2019   Lab Results  Component Value Date   HGBA1C 5.6 08/02/2019   HGBA1C 5.4 05/16/2017       Assessment & Plan:   Problem List Items Addressed This Visit       Cardiovascular and Mediastinum   Cerebrovascular accident (CVA) ([place])   Relevant Medications   hydrochlorothiazide (HYDRODIURIL) 12.5 MG tablet   rosuvastatin (CRESTOR) 20 MG tablet   Other Relevant Orders   Hemoglobin A1c   CBC with Differential/Platelet  Comprehensive metabolic panel   Lipid panel   POCT URINALYSIS DIP (CLINITEK)   Ambulatory referral to Physical Therapy Discussed at length fall precautions   Essential hypertension   Relevant Medications   hydrochlorothiazide (HYDRODIURIL) 12.5 MG tablet   rosuvastatin (CRESTOR) 20 MG tablet   Other Relevant Orders   Hemoglobin A1c   CBC with Differential/Platelet   Comprehensive metabolic  panel   Lipid panel   POCT URINALYSIS DIP (CLINITEK) Encouraged continued diet efforts Completed referral to PT to improve daily activity      Other   Vertigo   Relevant Medications   meclizine (ANTIVERT) 50 MG tablet, increased dosage  Discussed non pharmacological methods for management of dizziness   Other Relevant Orders   Hemoglobin A1c   CBC with Differential/Platelet   Comprehensive metabolic panel   Lipid panel   POCT URINALYSIS DIP (CLINITEK)   Ambulatory referral to Cardiology   Current mild episode of major depressive disorder without prior episode (HCC)   Relevant Medications   FLUoxetine (PROZAC) 20 MG tablet   Other Visit Diagnoses     Gastroesophageal reflux disease without esophagitis       Relevant Medications   famotidine (PEPCID) 20 MG tablet   meclizine (ANTIVERT) 50 MG tablet   Follow up in 3 mths for reevaluation of dizziness and chronic illness, sooner as needed    I have changed Clydene Pugh Alcantara's meclizine. I am also having him maintain his fluticasone, loperamide, traZODone, hydrochlorothiazide, famotidine, FLUoxetine, hydrochlorothiazide, and rosuvastatin.  Meds ordered this encounter  Medications   famotidine (PEPCID) 20 MG tablet    Sig: Take 1 tablet (20 mg total) by mouth 2 (two) times daily.    Dispense:  180 tablet    Refill:  3   FLUoxetine (PROZAC) 20 MG tablet    Sig: TOME 1 PASTILLA POR VIA ORAL CADA MANANA    Dispense:  90 tablet    Refill:  3   hydrochlorothiazide (HYDRODIURIL) 12.5 MG tablet    Sig: Take 1 tablet (12.5 mg total) by mouth daily.    Dispense:  90 tablet    Refill:  3   rosuvastatin (CRESTOR) 20 MG tablet    Sig: TAKE 1 TABLET (20 MG TOTAL) BY MOUTH DAILY.    Dispense:  90 tablet    Refill:  3   meclizine (ANTIVERT) 50 MG tablet    Sig: Take 1 tablet (50 mg total) by mouth 2 (two) times daily as needed for dizziness.    Dispense:  30 tablet    Refill:  2     Teena Dunk, NP

## 2021-03-31 ENCOUNTER — Other Ambulatory Visit: Payer: Self-pay | Admitting: Nurse Practitioner

## 2021-03-31 ENCOUNTER — Other Ambulatory Visit: Payer: Self-pay

## 2021-03-31 DIAGNOSIS — R42 Dizziness and giddiness: Secondary | ICD-10-CM

## 2021-03-31 DIAGNOSIS — I639 Cerebral infarction, unspecified: Secondary | ICD-10-CM

## 2021-03-31 LAB — HEMOGLOBIN A1C
Est. average glucose Bld gHb Est-mCnc: 126 mg/dL
Hgb A1c MFr Bld: 6 % — ABNORMAL HIGH (ref 4.8–5.6)

## 2021-03-31 LAB — COMPREHENSIVE METABOLIC PANEL
ALT: 46 IU/L — ABNORMAL HIGH (ref 0–44)
AST: 29 IU/L (ref 0–40)
Albumin/Globulin Ratio: 2 (ref 1.2–2.2)
Albumin: 4.5 g/dL (ref 3.8–4.8)
Alkaline Phosphatase: 52 IU/L (ref 44–121)
BUN/Creatinine Ratio: 14 (ref 10–24)
BUN: 13 mg/dL (ref 8–27)
Bilirubin Total: 0.3 mg/dL (ref 0.0–1.2)
CO2: 25 mmol/L (ref 20–29)
Calcium: 9.4 mg/dL (ref 8.6–10.2)
Chloride: 101 mmol/L (ref 96–106)
Creatinine, Ser: 0.9 mg/dL (ref 0.76–1.27)
Globulin, Total: 2.3 g/dL (ref 1.5–4.5)
Glucose: 80 mg/dL (ref 70–99)
Potassium: 4.8 mmol/L (ref 3.5–5.2)
Sodium: 138 mmol/L (ref 134–144)
Total Protein: 6.8 g/dL (ref 6.0–8.5)
eGFR: 93 mL/min/{1.73_m2} (ref >59)

## 2021-03-31 LAB — CBC WITH DIFFERENTIAL/PLATELET
Basophils Absolute: 0 10*3/uL (ref 0.0–0.2)
Basos: 0 %
EOS (ABSOLUTE): 0.1 10*3/uL (ref 0.0–0.4)
Eos: 1 %
Hematocrit: 43.7 % (ref 37.5–51.0)
Hemoglobin: 14.7 g/dL (ref 13.0–17.7)
Immature Grans (Abs): 0 10*3/uL (ref 0.0–0.1)
Immature Granulocytes: 0 %
Lymphocytes Absolute: 1.6 10*3/uL (ref 0.7–3.1)
Lymphs: 26 %
MCH: 31.7 pg (ref 26.6–33.0)
MCHC: 33.6 g/dL (ref 31.5–35.7)
MCV: 94 fL (ref 79–97)
Monocytes Absolute: 0.6 10*3/uL (ref 0.1–0.9)
Monocytes: 10 %
Neutrophils Absolute: 3.8 10*3/uL (ref 1.4–7.0)
Neutrophils: 63 %
Platelets: 232 10*3/uL (ref 150–450)
RBC: 4.64 x10E6/uL (ref 4.14–5.80)
RDW: 13.4 % (ref 11.6–15.4)
WBC: 6.1 10*3/uL (ref 3.4–10.8)

## 2021-03-31 LAB — LIPID PANEL
Chol/HDL Ratio: 2.5 ratio (ref 0.0–5.0)
Cholesterol, Total: 132 mg/dL (ref 100–199)
HDL: 52 mg/dL (ref >39)
LDL Chol Calc (NIH): 58 mg/dL (ref 0–99)
Triglycerides: 126 mg/dL (ref 0–149)
VLDL Cholesterol Cal: 22 mg/dL (ref 5–40)

## 2021-03-31 MED ORDER — CLOPIDOGREL BISULFATE 75 MG PO TABS
ORAL_TABLET | Freq: Every day | ORAL | 3 refills | Status: AC
  Filled 2021-03-31 – 2021-04-29 (×2): qty 30, 30d supply, fill #0
  Filled 2021-05-29: qty 30, 30d supply, fill #1
  Filled 2021-06-30: qty 30, 30d supply, fill #2
  Filled 2021-07-31: qty 30, 30d supply, fill #3
  Filled 2021-09-03: qty 30, 30d supply, fill #4
  Filled 2021-10-07: qty 30, 30d supply, fill #5
  Filled 2021-11-05: qty 30, 30d supply, fill #6
  Filled 2021-12-09: qty 30, 30d supply, fill #7
  Filled 2022-01-24: qty 30, 30d supply, fill #8
  Filled 2022-03-07: qty 30, 30d supply, fill #9

## 2021-03-31 MED ORDER — MECLIZINE HCL 25 MG PO TABS
50.0000 mg | ORAL_TABLET | Freq: Two times a day (BID) | ORAL | 2 refills | Status: DC | PRN
  Filled 2021-03-31 – 2021-04-29 (×2): qty 30, 8d supply, fill #0
  Filled 2021-05-29: qty 30, 8d supply, fill #1

## 2021-04-02 ENCOUNTER — Other Ambulatory Visit: Payer: Self-pay

## 2021-04-03 ENCOUNTER — Other Ambulatory Visit: Payer: Self-pay

## 2021-04-29 ENCOUNTER — Other Ambulatory Visit: Payer: Self-pay

## 2021-05-12 ENCOUNTER — Other Ambulatory Visit (HOSPITAL_COMMUNITY): Payer: Self-pay

## 2021-05-15 ENCOUNTER — Other Ambulatory Visit (HOSPITAL_COMMUNITY): Payer: Self-pay

## 2021-05-29 ENCOUNTER — Other Ambulatory Visit: Payer: Self-pay

## 2021-06-29 ENCOUNTER — Ambulatory Visit: Payer: Self-pay | Admitting: Nurse Practitioner

## 2021-06-30 ENCOUNTER — Other Ambulatory Visit: Payer: Self-pay | Admitting: Nurse Practitioner

## 2021-06-30 ENCOUNTER — Other Ambulatory Visit: Payer: Self-pay

## 2021-06-30 DIAGNOSIS — R42 Dizziness and giddiness: Secondary | ICD-10-CM

## 2021-07-01 ENCOUNTER — Other Ambulatory Visit: Payer: Self-pay

## 2021-07-01 MED ORDER — MECLIZINE HCL 25 MG PO TABS
50.0000 mg | ORAL_TABLET | Freq: Two times a day (BID) | ORAL | 2 refills | Status: AC | PRN
  Filled 2021-07-01: qty 30, 8d supply, fill #0
  Filled 2021-07-31: qty 30, 8d supply, fill #1
  Filled 2021-09-03: qty 30, 8d supply, fill #2

## 2021-07-06 ENCOUNTER — Encounter (HOSPITAL_BASED_OUTPATIENT_CLINIC_OR_DEPARTMENT_OTHER): Payer: Self-pay

## 2021-07-31 ENCOUNTER — Other Ambulatory Visit: Payer: Self-pay

## 2021-08-04 ENCOUNTER — Other Ambulatory Visit: Payer: Self-pay

## 2021-09-03 ENCOUNTER — Other Ambulatory Visit (HOSPITAL_BASED_OUTPATIENT_CLINIC_OR_DEPARTMENT_OTHER): Payer: Self-pay

## 2021-09-03 ENCOUNTER — Other Ambulatory Visit: Payer: Self-pay

## 2021-09-03 ENCOUNTER — Other Ambulatory Visit (HOSPITAL_COMMUNITY): Payer: Self-pay

## 2021-09-04 ENCOUNTER — Other Ambulatory Visit: Payer: Self-pay

## 2021-10-07 ENCOUNTER — Other Ambulatory Visit: Payer: Self-pay

## 2021-10-07 ENCOUNTER — Other Ambulatory Visit: Payer: Self-pay | Admitting: Nurse Practitioner

## 2021-10-07 DIAGNOSIS — R42 Dizziness and giddiness: Secondary | ICD-10-CM

## 2021-10-08 ENCOUNTER — Other Ambulatory Visit: Payer: Self-pay

## 2021-10-09 ENCOUNTER — Other Ambulatory Visit: Payer: Self-pay

## 2021-11-05 ENCOUNTER — Other Ambulatory Visit: Payer: Self-pay

## 2021-11-09 ENCOUNTER — Ambulatory Visit (INDEPENDENT_AMBULATORY_CARE_PROVIDER_SITE_OTHER): Payer: Self-pay | Admitting: Nurse Practitioner

## 2021-11-09 ENCOUNTER — Encounter: Payer: Self-pay | Admitting: Nurse Practitioner

## 2021-11-09 VITALS — BP 135/86 | HR 58 | Temp 97.6°F | Ht 64.0 in | Wt 191.0 lb

## 2021-11-09 DIAGNOSIS — R413 Other amnesia: Secondary | ICD-10-CM

## 2021-11-09 DIAGNOSIS — I10 Essential (primary) hypertension: Secondary | ICD-10-CM

## 2021-11-09 DIAGNOSIS — I639 Cerebral infarction, unspecified: Secondary | ICD-10-CM

## 2021-11-09 DIAGNOSIS — R42 Dizziness and giddiness: Secondary | ICD-10-CM

## 2021-11-09 LAB — POCT GLYCOSYLATED HEMOGLOBIN (HGB A1C)
HbA1c POC (<> result, manual entry): 5.7 % (ref 4.0–5.6)
HbA1c, POC (controlled diabetic range): 5.7 % (ref 0.0–7.0)
HbA1c, POC (prediabetic range): 5.7 % (ref 5.7–6.4)
Hemoglobin A1C: 5.7 % — AB (ref 4.0–5.6)

## 2021-11-09 NOTE — Patient Instructions (Signed)
1. Cerebrovascular accident (CVA), unspecified mechanism (HCC)  - Ambulatory referral to Neurology - CBC - Comprehensive metabolic panel  2. Memory loss  - Ambulatory referral to Neurology  3. Dizziness  - Ambulatory referral to Neurology   Follow up:  Follow up in 6 months or sooner if needed

## 2021-11-09 NOTE — Progress Notes (Signed)
@Patient  ID: , male    DOB: December 30, 1950, 71 y.o.   MRN: [id number]  Chief Complaint  Patient presents with   Follow-up    3 mth follow up,Pt stated feels dizzy all the time. Since having the stroke. Family member stated the pt is very forgetful.    Referring provider: [id number], NP   HPI  Mark Robles 71 y.o. male  has a past medical history of Depression, DVT (deep venous thrombosis) (HCC), Hyperlipidemia, Left-sided sensory deficit present, Seizure (HCC), and Stroke (HCC).  To the [facility] for reevaluation of dizziness, medication refill and wellness exam. Patient accompanied by wife who is his primary caregiver.   Wife verbalizes concern about patient's dizziness. States that patient has had continued dizziness since stroke in 2011. Has been taking the same dose of Meclizine since prescribed. Wife states that patient symptoms would likely be worse without the medication, but they are wanting the dizziness to be resolved. Experiences dizziness when walking and looking down. Last evaluation by neurologist and cardiologist several yrs ago. Also has not been to PT in several years. Referral placed to PT and cardiology at last visit but patient did not go to either appointment.  We will refer him to neurology today for ongoing dizziness and memory loss. Denies f/c/s, n/v/d, hemoptysis, PND, leg swelling Denies chest pain or edema     Allergies  Allergen Reactions   Amlodipine Itching and Swelling    Immunization History  Administered Date(s) Administered   Pneumococcal Conjugate-13 07/13/2018   Pneumococcal Polysaccharide-23 05/16/2017   Tdap 05/16/2017   Unspecified SARS-COV-2 Vaccination 05/30/2019, 07/25/2019    Past Medical History:  Diagnosis Date   Depression    DVT (deep venous thrombosis) (HCC)    Hyperlipidemia    Left-sided sensory deficit present    Seizure (HCC)    Stroke (HCC)     Tobacco History: Social History    Tobacco Use  Smoking Status Never  Smokeless Tobacco Never   Counseling given: Not Answered   Outpatient Encounter Medications as of 11/09/2021  Medication Sig   clopidogrel (PLAVIX) 75 MG tablet TAKE 1 TABLET (75 MG TOTAL) BY MOUTH DAILY.   famotidine (PEPCID) 20 MG tablet Take 1 tablet (20 mg total) by mouth 2 (two) times daily.   FLUoxetine (PROZAC) 20 MG tablet TOME 1 PASTILLA POR VIA ORAL CADA MANANA   fluticasone (FLONASE) 50 MCG/ACT nasal spray Place 2 sprays into both nostrils daily.   hydrochlorothiazide (MICROZIDE) 12.5 MG capsule Take 1 capsule (12.5 mg total) by mouth daily.   rosuvastatin (CRESTOR) 20 MG tablet TAKE 1 TABLET (20 MG TOTAL) BY MOUTH DAILY.   hydrochlorothiazide (MICROZIDE) 12.5 MG capsule TAKE 1 TABLET (12.5 MG TOTAL) BY MOUTH DAILY.   loperamide (IMODIUM) 2 MG capsule TAKE 2 CAPSULES BY MOUTH INITIALLY THEN 1 CAPSULE AFTER EACH LOOSE STOOL (DO NOT EXCEED 8 CAPSULES IN 24 HOURS) (Patient not taking: Reported on 11/09/2021)   traZODone (DESYREL) 50 MG tablet TAKE 0.5-1 TABLETS (25-50 MG TOTAL) BY MOUTH AT BEDTIME.   No facility-administered encounter medications on file as of 11/09/2021.     Review of Systems  Review of Systems  Constitutional: Negative.   HENT: Negative.    Cardiovascular: Negative.   Gastrointestinal: Negative.   Allergic/Immunologic: Negative.   Neurological:  Positive for dizziness.  Psychiatric/Behavioral:  Positive for confusion.        Physical Exam  BP 135/86 (BP Location: Right Arm, Patient Position: Sitting, Cuff Size: Normal)  Pulse (!) 58   Temp 97.6 F (36.4 C)   Ht 5\' 4"  (1.626 m)   Wt 191 lb (86.6 kg)   SpO2 100%   BMI 32.79 kg/m   Wt Readings from Last 5 Encounters:  11/09/21 191 lb (86.6 kg)  03/30/21 192 lb 2 oz (87.1 kg)  03/06/20 183 lb 3.2 oz (83.1 kg)  08/02/19 180 lb 9.6 oz (81.9 kg)  05/04/19 182 lb (82.6 kg)     Physical Exam Vitals and nursing note reviewed.  Constitutional:       General: He is not in acute distress.    Appearance: He is well-developed.  Cardiovascular:     Rate and Rhythm: Normal rate and regular rhythm.  Pulmonary:     Effort: Pulmonary effort is normal.     Breath sounds: Normal breath sounds.  Skin:    General: Skin is warm and dry.  Neurological:     Mental Status: He is alert and oriented to person, place, and time.      Lab Results:  CBC    Component Value Date/Time   WBC 6.1 03/30/2021 1457   WBC 5.6 08/16/2018 0520   RBC 4.64 03/30/2021 1457   RBC 4.73 08/16/2018 0520   HGB 14.7 03/30/2021 1457   HCT 43.7 03/30/2021 1457   PLT 232 03/30/2021 1457   MCV 94 03/30/2021 1457   MCH 31.7 03/30/2021 1457   MCH 30.2 08/16/2018 0520   MCHC 33.6 03/30/2021 1457   MCHC 32.6 08/16/2018 0520   RDW 13.4 03/30/2021 1457   LYMPHSABS 1.6 03/30/2021 1457   MONOABS 0.0 (L) 08/11/2018 0247   EOSABS 0.1 03/30/2021 1457   BASOSABS 0.0 03/30/2021 1457    BMET    Component Value Date/Time   NA 138 03/30/2021 1457   K 4.8 03/30/2021 1457   CL 101 03/30/2021 1457   CO2 25 03/30/2021 1457   GLUCOSE 80 03/30/2021 1457   GLUCOSE 93 08/16/2018 0520   BUN 13 03/30/2021 1457   CREATININE 0.90 03/30/2021 1457   CALCIUM 9.4 03/30/2021 1457   GFRNONAA 93 03/06/2020 1212   GFRAA 107 03/06/2020 1212    BNP    Component Value Date/Time   BNP 13.4 08/09/2018 2024    ProBNP No results found for: "PROBNP"  Imaging: No results found.   Assessment & Plan:   Cerebrovascular accident (CVA) [facility]) - Ambulatory referral to Neurology - CBC - Comprehensive metabolic panel  2. Memory loss  - Ambulatory referral to Neurology  3. Dizziness  - Ambulatory referral to Neurology   Follow up:  Follow up in 6 months or sooner if needed     [facility], NP 11/09/2021

## 2021-11-09 NOTE — Assessment & Plan Note (Signed)
-   Ambulatory referral to Neurology - CBC - Comprehensive metabolic panel  2. Memory loss  - Ambulatory referral to Neurology  3. Dizziness  - Ambulatory referral to Neurology   Follow up:  Follow up in 6 months or sooner if needed

## 2021-11-10 LAB — COMPREHENSIVE METABOLIC PANEL
ALT: 37 IU/L (ref 0–44)
AST: 27 IU/L (ref 0–40)
Albumin/Globulin Ratio: 1.9 (ref 1.2–2.2)
Albumin: 4.6 g/dL (ref 3.9–4.9)
Alkaline Phosphatase: 50 IU/L (ref 44–121)
BUN/Creatinine Ratio: 15 (ref 10–24)
BUN: 13 mg/dL (ref 8–27)
Bilirubin Total: 0.3 mg/dL (ref 0.0–1.2)
CO2: 24 mmol/L (ref 20–29)
Calcium: 9.9 mg/dL (ref 8.6–10.2)
Chloride: 103 mmol/L (ref 96–106)
Creatinine, Ser: 0.86 mg/dL (ref 0.76–1.27)
Globulin, Total: 2.4 g/dL (ref 1.5–4.5)
Glucose: 90 mg/dL (ref 70–99)
Potassium: 5.1 mmol/L (ref 3.5–5.2)
Sodium: 141 mmol/L (ref 134–144)
Total Protein: 7 g/dL (ref 6.0–8.5)
eGFR: 94 mL/min/{1.73_m2} (ref >59)

## 2021-11-10 LAB — CBC
Hematocrit: 43.8 % (ref 37.5–51.0)
Hemoglobin: 14.8 g/dL (ref 13.0–17.7)
MCH: 31.6 pg (ref 26.6–33.0)
MCHC: 33.8 g/dL (ref 31.5–35.7)
MCV: 94 fL (ref 79–97)
Platelets: 215 10*3/uL (ref 150–450)
RBC: 4.68 x10E6/uL (ref 4.14–5.80)
RDW: 13.3 % (ref 11.6–15.4)
WBC: 5.7 10*3/uL (ref 3.4–10.8)

## 2021-11-20 ENCOUNTER — Other Ambulatory Visit: Payer: Self-pay

## 2021-12-09 ENCOUNTER — Institutional Professional Consult (permissible substitution): Payer: Self-pay | Admitting: Neurology

## 2021-12-09 ENCOUNTER — Other Ambulatory Visit: Payer: Self-pay

## 2022-01-13 ENCOUNTER — Encounter (HOSPITAL_COMMUNITY): Payer: Self-pay | Admitting: *Deleted

## 2022-01-13 ENCOUNTER — Emergency Department (HOSPITAL_COMMUNITY)
Admission: EM | Admit: 2022-01-13 | Discharge: 2022-01-14 | Disposition: A | Discharge status: Home / Self Care | Payer: Self-pay | Attending: Emergency Medicine | Admitting: Emergency Medicine

## 2022-01-13 ENCOUNTER — Other Ambulatory Visit: Payer: Self-pay

## 2022-01-13 ENCOUNTER — Emergency Department (HOSPITAL_COMMUNITY): Payer: Self-pay

## 2022-01-13 DIAGNOSIS — J3489 Other specified disorders of nose and nasal sinuses: Secondary | ICD-10-CM | POA: Insufficient documentation

## 2022-01-13 DIAGNOSIS — U071 COVID-19: Secondary | ICD-10-CM | POA: Insufficient documentation

## 2022-01-13 DIAGNOSIS — Z7902 Long term (current) use of antithrombotics/antiplatelets: Secondary | ICD-10-CM | POA: Insufficient documentation

## 2022-01-13 LAB — CBC WITH DIFFERENTIAL/PLATELET
Abs Immature Granulocytes: 0.01 10*3/uL (ref 0.00–0.07)
Basophils Absolute: 0 10*3/uL (ref 0.0–0.1)
Basophils Relative: 0 %
Eosinophils Absolute: 0.1 10*3/uL (ref 0.0–0.5)
Eosinophils Relative: 1 %
HCT: 41.3 % (ref 39.0–52.0)
Hemoglobin: 14 g/dL (ref 13.0–17.0)
Immature Granulocytes: 0 %
Lymphocytes Relative: 14 %
Lymphs Abs: 1 10*3/uL (ref 0.7–4.0)
MCH: 31.1 pg (ref 26.0–34.0)
MCHC: 33.9 g/dL (ref 30.0–36.0)
MCV: 91.8 fL (ref 80.0–100.0)
Monocytes Absolute: 0.8 10*3/uL (ref 0.1–1.0)
Monocytes Relative: 11 %
Neutro Abs: 5.3 10*3/uL (ref 1.7–7.7)
Neutrophils Relative %: 74 %
Platelets: 189 10*3/uL (ref 150–400)
RBC: 4.5 MIL/uL (ref 4.22–5.81)
RDW: 13.9 % (ref 11.5–15.5)
WBC: 7.1 10*3/uL (ref 4.0–10.5)
nRBC: 0 % (ref 0.0–0.2)

## 2022-01-13 LAB — BASIC METABOLIC PANEL
Anion gap: 7 (ref 5–15)
BUN: 5 mg/dL — ABNORMAL LOW (ref 8–23)
CO2: 22 mmol/L (ref 22–32)
Calcium: 8.8 mg/dL — ABNORMAL LOW (ref 8.9–10.3)
Chloride: 107 mmol/L (ref 98–111)
Creatinine, Ser: 0.91 mg/dL (ref 0.61–1.24)
GFR, Estimated: >60 mL/min (ref >60)
Glucose, Bld: 107 mg/dL — ABNORMAL HIGH (ref 70–99)
Potassium: 3.4 mmol/L — ABNORMAL LOW (ref 3.5–5.1)
Sodium: 136 mmol/L (ref 135–145)

## 2022-01-13 LAB — RESP PANEL BY RT-PCR (FLU A&B, COVID) ARPGX2
Influenza A by PCR: NEGATIVE
Influenza B by PCR: NEGATIVE
SARS Coronavirus 2 by RT PCR: POSITIVE — AB

## 2022-01-13 NOTE — ED Notes (Signed)
Stroke 2012

## 2022-01-13 NOTE — ED Triage Notes (Signed)
The pt has been ill since yesterday with a fever chills chest congestion  his son came in and had  been positive for covid  the pt has not been tested

## 2022-01-13 NOTE — ED Provider Triage Note (Signed)
Emergency Medicine Provider Triage Evaluation Note  Keionte Swicegood , a 71 y.o. male  was evaluated in triage.  Pt complains of fever, cough, generalized weakness, headache x2 days.  Translator utilized.  History of stroke with left-sided deficits.  Review of Systems  Positive: Cough, flulike symptoms Negative: Vomiting  Physical Exam  BP 132/78   Pulse 73   Temp 99.4 F (37.4 C) (Oral)   Resp 18   Ht 5\' 4"  (1.626 m)   Wt 86.6 kg   SpO2 94%   BMI 32.77 kg/m  Gen:   Awake, no distress   Resp:  Normal effort  MSK:   Moves extremities without difficulty  Other:  cough  Medical Decision Making  Medically screening exam initiated at 5:01 PM.  Appropriate orders placed.  Derik Fults was informed that the remainder of the evaluation will be completed by another provider, this initial triage assessment does not replace that evaluation, and the importance of remaining in the ED until their evaluation is complete.  Work-up initiated   Margarita Mail, PA-C 01/13/22 1702

## 2022-01-13 NOTE — ED Notes (Signed)
Patient wife upset over wait time. States patient became incont d/t being sick and waiting. Patient provided disposable underwear and paper scrub pants to change into

## 2022-01-13 NOTE — ED Notes (Signed)
Patient moved to appropriate seating area 

## 2022-01-14 ENCOUNTER — Other Ambulatory Visit: Payer: Self-pay

## 2022-01-14 MED ORDER — NIRMATRELVIR/RITONAVIR (PAXLOVID)TABLET
3.0000 | ORAL_TABLET | Freq: Two times a day (BID) | ORAL | 0 refills | Status: AC
  Filled 2022-01-14: qty 30, 5d supply, fill #0

## 2022-01-14 NOTE — ED Provider Notes (Signed)
[facility] EMERGENCY DEPARTMENT Provider Note   CSN: [id number] Arrival date & time: 01/13/22  1522     History  Chief Complaint  Patient presents with   Generalized Body Aches    Mark Robles is a 71 y.o. male.  The history is provided by the patient and the spouse. The history is limited by a language barrier. A language interpreter was used Production assistant, radio [phone]).  Cough Severity:  Moderate Onset quality:  Gradual Duration:  2 days Chronicity:  New Smoker: no   Associated symptoms: chills, fever, headaches and rhinorrhea   Associated symptoms: no shortness of breath    Pt with sick contacts (son) He has had COVID vaccines    Home Medications Prior to Admission medications   Medication Sig Start Date End Date Taking? Authorizing Provider  nirmatrelvir/ritonavir EUA (PAXLOVID) 20 x 150 MG & 10 x 100MG  TABS Take 3 tablets by mouth 2 (two) times daily for 5 days. Patient GFR is 60. Take nirmatrelvir (150 mg) two tablets twice daily for 5 days and ritonavir (100 mg) one tablet twice daily for 5 days. 01/14/22 01/19/22 Yes Ripley Fraise, MD  clopidogrel (PLAVIX) 75 MG tablet TAKE 1 TABLET (75 MG TOTAL) BY MOUTH DAILY. 03/31/21 03/31/22  Bo Merino I, NP  famotidine (PEPCID) 20 MG tablet Take 1 tablet (20 mg total) by mouth 2 (two) times daily. 03/30/21 03/30/22  Bo Merino I, NP  FLUoxetine (PROZAC) 20 MG tablet TOME 1 PASTILLA POR VIA ORAL CADA [facility] 03/30/21 03/30/22  Passmore, Jake Church I, NP  fluticasone (FLONASE) 50 MCG/ACT nasal spray Place 2 sprays into both nostrils daily. 10/26/17   Lanae Boast, FNP  hydrochlorothiazide (MICROZIDE) 12.5 MG capsule TAKE 1 TABLET (12.5 MG TOTAL) BY MOUTH DAILY. 03/06/20 03/29/21  Vevelyn Francois, NP  hydrochlorothiazide (MICROZIDE) 12.5 MG capsule Take 1 capsule (12.5 mg total) by mouth daily. 03/30/21 03/30/22  Passmore, Jake Church I, NP  loperamide (IMODIUM) 2 MG capsule TAKE 2 CAPSULES BY  MOUTH INITIALLY THEN 1 CAPSULE AFTER EACH LOOSE STOOL (DO NOT EXCEED 8 CAPSULES IN 24 HOURS) Patient not taking: Reported on 11/09/2021 10/10/17   [provider]  rosuvastatin (CRESTOR) 20 MG tablet TAKE 1 TABLET (20 MG TOTAL) BY MOUTH DAILY. 03/30/21 03/30/22  Bo Merino I, NP  traZODone (DESYREL) 50 MG tablet TAKE 0.5-1 TABLETS (25-50 MG TOTAL) BY MOUTH AT BEDTIME. 03/06/20 03/06/21  Vevelyn Francois, NP      Allergies    Amlodipine    Review of Systems   Review of Systems  Constitutional:  Positive for chills and fever.  HENT:  Positive for rhinorrhea.   Respiratory:  Positive for cough. Negative for shortness of breath.   Cardiovascular:        "Chest congestion"  Gastrointestinal:  Negative for diarrhea and vomiting.  Neurological:  Positive for headaches.    Physical Exam Updated Vital Signs BP (!) 142/86   Pulse 73   Temp 98.5 F (36.9 C) (Oral)   Resp 16   Ht 1.626 m (5\' 4" )   Wt 86.6 kg   SpO2 99%   BMI 32.77 kg/m  Physical Exam CONSTITUTIONAL: Elderly, chronically ill-appearing HEAD: Normocephalic/atraumatic EYES: EOMI/PERRL ENMT: Mucous membranes moist, poor dentition, no stridor or drooling NECK: supple no meningeal signs SPINE/BACK:entire spine nontender CV: S1/S2 noted, no murmurs/rubs/gallops noted LUNGS: Lungs are clear to auscultation bilaterally, no apparent distress ABDOMEN: soft, nontender NEURO: Pt is awake/alert Chronic left-sided weakness noted EXTREMITIES: pulses normal/equal, full ROM SKIN: warm,  color normal PSYCH: no abnormalities of mood noted, alert and oriented to situation  ED Results / Procedures / Treatments   Labs (all labs ordered are listed, but only abnormal results are displayed) Labs Reviewed  RESP PANEL BY RT-PCR (FLU A&B, COVID) ARPGX2 - Abnormal; Notable for the following components:      Result Value   SARS Coronavirus 2 by RT PCR POSITIVE (*)    All other components within normal limits  BASIC METABOLIC  PANEL - Abnormal; Notable for the following components:   Potassium 3.4 (*)    Glucose, Bld 107 (*)    BUN 5 (*)    Calcium 8.8 (*)    All other components within normal limits  CBC WITH DIFFERENTIAL/PLATELET    EKG None  Radiology DG Chest 2 View  Result Date: 01/13/2022 CLINICAL DATA:  Cough. EXAM: CHEST - 2 VIEW COMPARISON:  Chest radiograph 08/10/2018 FINDINGS: Again noted is mild elevation of the right hemidiaphragm. Lungs are clear without airspace disease or pulmonary edema. Heart and mediastinum are within normal limits. Trachea is midline. No pleural effusions. No acute bone abnormality. IMPRESSION: No active cardiopulmonary disease. Electronically Signed   By: Markus Daft M.D.   On: 01/13/2022 17:35    Procedures Procedures    Medications Ordered in ED Medications - No data to display  ED Course/ Medical Decision Making/ A&P                           Medical Decision Making  This patient presents to the ED for concern of cough, this involves an extensive number of treatment options, and is a complaint that carries with it a high risk of complications and morbidity.  The differential diagnosis includes but is not limited to pneumonia, URI, COVID-19, CHF  Comorbidities that complicate the patient evaluation: Patient's presentation is complicated by their history of previous CVA, previous history respiratory failure due to COVID-19  Social Determinants of Health: Patient's  English is a second language   increases the complexity of managing their presentation  Additional history obtained: Additional history obtained from spouse Records reviewed previous admission documents previous admission for COVID-19 and required intubation  Lab Tests: I Ordered, and personally interpreted labs.  The pertinent results include: Positive for COVID-19  Imaging Studies ordered: I ordered imaging studies including X-ray chest   I independently visualized and interpreted imaging  which showed no acute findings I agree with the radiologist interpretation  Test Considered: Patient without hypoxia, no tachycardia and no added lung sounds.  He is safe for outpatient management and agrees to use Paxlovid   Complexity of problems addressed: Patient's presentation is most consistent with  acute presentation with potential threat to life or bodily function  Disposition: After consideration of the diagnostic results and the patient's response to treatment,  I feel that the patent would benefit from discharge   .           Final Clinical Impression(s) / ED Diagnoses Final diagnoses:  COVID-19    Rx / DC Orders ED Discharge Orders          Ordered    nirmatrelvir/ritonavir EUA (PAXLOVID) 20 x 150 MG & 10 x 100MG  TABS  2 times daily        01/14/22 0143              Ripley Fraise, MD 01/14/22 0210

## 2022-01-24 ENCOUNTER — Other Ambulatory Visit: Payer: Self-pay | Admitting: Nurse Practitioner

## 2022-01-24 DIAGNOSIS — G4700 Insomnia, unspecified: Secondary | ICD-10-CM

## 2022-01-25 ENCOUNTER — Other Ambulatory Visit: Payer: Self-pay | Admitting: Nurse Practitioner

## 2022-01-25 ENCOUNTER — Other Ambulatory Visit: Payer: Self-pay

## 2022-01-25 ENCOUNTER — Other Ambulatory Visit (HOSPITAL_COMMUNITY): Payer: Self-pay

## 2022-01-25 MED ORDER — TRAZODONE HCL 50 MG PO TABS
25.0000 mg | ORAL_TABLET | Freq: Every evening | ORAL | 3 refills | Status: DC
  Filled 2022-01-25: qty 90, 90d supply, fill #0
  Filled 2022-03-07: qty 90, 90d supply, fill #1
  Filled 2022-04-15 – 2022-08-26 (×2): qty 90, 90d supply, fill #2
  Filled 2022-09-24: qty 90, 90d supply, fill #3

## 2022-01-25 MED ORDER — LOPERAMIDE HCL 2 MG PO TABS
4.0000 mg | ORAL_TABLET | ORAL | 0 refills | Status: DC | PRN
  Filled 2022-01-25: qty 30, fill #0
  Filled 2022-03-07: qty 24, 12d supply, fill #0

## 2022-01-25 MED ORDER — HYDROCHLOROTHIAZIDE 12.5 MG PO CAPS
12.5000 mg | ORAL_CAPSULE | Freq: Every day | ORAL | 3 refills | Status: DC
  Filled 2022-01-25 – 2022-03-07 (×2): qty 90, 90d supply, fill #0
  Filled 2022-04-15: qty 90, 90d supply, fill #1

## 2022-02-15 ENCOUNTER — Encounter: Payer: Self-pay | Admitting: Neurology

## 2022-02-15 ENCOUNTER — Institutional Professional Consult (permissible substitution): Payer: Self-pay | Admitting: Neurology

## 2022-03-08 ENCOUNTER — Other Ambulatory Visit: Payer: Self-pay

## 2022-04-15 ENCOUNTER — Other Ambulatory Visit: Payer: Self-pay | Admitting: Nurse Practitioner

## 2022-04-15 ENCOUNTER — Other Ambulatory Visit: Payer: Self-pay

## 2022-04-16 ENCOUNTER — Other Ambulatory Visit: Payer: Self-pay

## 2022-04-21 ENCOUNTER — Other Ambulatory Visit: Payer: Self-pay

## 2022-04-21 MED ORDER — LOPERAMIDE HCL 2 MG PO TABS
4.0000 mg | ORAL_TABLET | ORAL | 0 refills | Status: DC | PRN
  Filled 2022-04-21: qty 24, 12d supply, fill #0

## 2022-04-21 NOTE — Telephone Encounter (Signed)
Caller & Relationship to patient:  MRN #  [id number]   Call Back Number:   Date of Last Office Visit: 01/25/2022     Date of Next Office Visit: 05/13/2022    Medication(s) to be Refilled: clopidogrel, fluoxetine, rosuvastatin  Preferred Pharmacy:   ** Please notify patient to allow 48-72 hours to process** **Let patient know to contact pharmacy at the end of the day to make sure medication is ready. ** **If patient has not been seen in a year or longer, book an appointment **Advise to use MyChart for refill requests OR to contact their pharmacy

## 2022-04-22 ENCOUNTER — Other Ambulatory Visit: Payer: Self-pay

## 2022-04-26 ENCOUNTER — Other Ambulatory Visit: Payer: Self-pay

## 2022-05-13 ENCOUNTER — Other Ambulatory Visit: Payer: Self-pay

## 2022-05-13 ENCOUNTER — Encounter: Payer: Self-pay | Admitting: Nurse Practitioner

## 2022-05-13 ENCOUNTER — Ambulatory Visit (INDEPENDENT_AMBULATORY_CARE_PROVIDER_SITE_OTHER): Payer: Self-pay | Admitting: Nurse Practitioner

## 2022-05-13 VITALS — BP 123/70 | HR 64 | Temp 97.8°F | Ht 65.75 in | Wt 191.0 lb

## 2022-05-13 DIAGNOSIS — G4700 Insomnia, unspecified: Secondary | ICD-10-CM

## 2022-05-13 DIAGNOSIS — Z1283 Encounter for screening for malignant neoplasm of skin: Secondary | ICD-10-CM

## 2022-05-13 DIAGNOSIS — F320 Major depressive disorder, single episode, mild: Secondary | ICD-10-CM

## 2022-05-13 DIAGNOSIS — Z13220 Encounter for screening for lipoid disorders: Secondary | ICD-10-CM

## 2022-05-13 DIAGNOSIS — R413 Other amnesia: Secondary | ICD-10-CM

## 2022-05-13 DIAGNOSIS — I639 Cerebral infarction, unspecified: Secondary | ICD-10-CM

## 2022-05-13 DIAGNOSIS — R42 Dizziness and giddiness: Secondary | ICD-10-CM

## 2022-05-13 DIAGNOSIS — I10 Essential (primary) hypertension: Secondary | ICD-10-CM

## 2022-05-13 MED ORDER — ROSUVASTATIN CALCIUM 20 MG PO TABS
20.0000 mg | ORAL_TABLET | Freq: Every day | ORAL | 3 refills | Status: DC
  Filled 2022-05-13: qty 90, 90d supply, fill #0
  Filled 2022-08-26: qty 90, 90d supply, fill #1
  Filled 2022-09-24: qty 90, 90d supply, fill #2
  Filled 2022-10-28: qty 90, 90d supply, fill #3
  Filled 2022-12-07: qty 30, 30d supply, fill #3
  Filled 2023-01-03: qty 30, 30d supply, fill #4
  Filled 2023-02-06 – 2023-02-07 (×2): qty 30, 30d supply, fill #5

## 2022-05-13 MED ORDER — FLUOXETINE HCL 20 MG PO TABS
20.0000 mg | ORAL_TABLET | Freq: Every day | ORAL | 3 refills | Status: DC
  Filled 2022-05-13: qty 90, 90d supply, fill #0
  Filled 2022-08-26: qty 90, 90d supply, fill #1
  Filled 2022-09-24: qty 90, 90d supply, fill #2
  Filled 2022-10-28: qty 90, 90d supply, fill #3
  Filled 2022-12-07: qty 30, 30d supply, fill #3
  Filled 2023-01-03: qty 30, 30d supply, fill #4
  Filled 2023-02-06 – 2023-02-07 (×2): qty 30, 30d supply, fill #5

## 2022-05-13 MED ORDER — CLOPIDOGREL BISULFATE 75 MG PO TABS
75.0000 mg | ORAL_TABLET | Freq: Every day | ORAL | 0 refills | Status: AC
  Filled 2022-05-13: qty 90, 90d supply, fill #0

## 2022-05-13 MED ORDER — HYDROCHLOROTHIAZIDE 12.5 MG PO CAPS
12.5000 mg | ORAL_CAPSULE | Freq: Every day | ORAL | 3 refills | Status: DC
  Filled 2022-05-13 – 2022-06-04 (×2): qty 90, 90d supply, fill #0
  Filled 2022-09-24: qty 90, 90d supply, fill #1
  Filled 2022-10-28: qty 90, 90d supply, fill #2
  Filled 2022-12-07: qty 30, 30d supply, fill #2
  Filled 2023-01-03: qty 30, 30d supply, fill #3
  Filled 2023-02-06 – 2023-02-07 (×2): qty 30, 30d supply, fill #4
  Filled 2023-04-14 (×2): qty 30, 30d supply, fill #5

## 2022-05-13 NOTE — Patient Instructions (Addendum)
1. Essential hypertension  - hydrochlorothiazide (MICROZIDE) 12.5 MG capsule; Take 1 capsule (12.5 mg total) by mouth daily.  Dispense: 90 capsule; Refill: 3 - rosuvastatin (CRESTOR) 20 MG tablet; TAKE 1 TABLET (20 MG TOTAL) BY MOUTH DAILY.  Dispense: 90 tablet; Refill: 3 - Hemoglobin A1c - CBC - Comprehensive metabolic panel - Lipid Panel  2. Current mild episode of major depressive disorder without prior episode (HCC)  - FLUoxetine (PROZAC) 20 MG tablet; TOME 1 PASTILLA POR VIA ORAL CADA MANANA  Dispense: 90 tablet; Refill: 3  3. Insomnia, unspecified type   4. Cerebrovascular accident (CVA), unspecified mechanism ([place])  - clopidogrel (PLAVIX) 75 MG tablet; Take 1 tablet (75 mg total) by mouth daily.  Dispense: 90 tablet; Refill: 0 - rosuvastatin (CRESTOR) 20 MG tablet; TAKE 1 TABLET (20 MG TOTAL) BY MOUTH DAILY.  Dispense: 90 tablet; Refill: 3 - Ambulatory referral to Neurology - Hemoglobin A1c - CBC - Comprehensive metabolic panel - Lipid Panel  5. Dizziness  - Ambulatory referral to Neurology  6. Memory loss  - Ambulatory referral to Neurology  7. Lipid screening  - Lipid Panel  8. Skin cancer screening  - Ambulatory referral to Dermatology  Follow up:  Follow up in 3 months

## 2022-05-13 NOTE — Progress Notes (Signed)
@Patient  ID: Mark Robles, male    DOB: 12/01/1950, 72 y.o.   MRN: [id number]  Chief Complaint  Patient presents with   Follow-up    Refills. He is having trouble getting sleep and forgetting things all the time. Walking makes him dizzy.     Referring provider: Fenton Foy, NP   HPI  Mark Robles 72 y.o. male  has a past medical history of Depression, DVT (deep venous thrombosis) ([place]), Hyperlipidemia, Left-sided sensory deficit present, Seizure ([place]), and Stroke ([place]).  To the [facility] for reevaluation of dizziness, medication refill and wellness exam. Patient accompanied by son.  Patient presents today for follow-up visit.  Patient was last seen in our office last July.  Patient does have a history of stroke.  Patient does have left-sided sensory deficit.  He was complaining of dizziness and memory loss at last visit.  Placed referrals to PT, neurology, cardiology.  Patient's family failed to take him to any of those appointments.  Family is with him today.  We discussed that I can place another referral back to neurology but they wanted to take him to the visit. Denies f/c/s, n/v/d, hemoptysis, PND, leg swelling Denies chest pain or edema     Allergies  Allergen Reactions   Amlodipine Itching and Swelling    Immunization History  Administered Date(s) Administered   Pneumococcal Conjugate-13 07/13/2018   Pneumococcal Polysaccharide-23 05/16/2017   Tdap 05/16/2017   Unspecified SARS-COV-2 Vaccination 05/30/2019, 07/25/2019    Past Medical History:  Diagnosis Date   Depression    DVT (deep venous thrombosis) (HCC)    Hyperlipidemia    Left-sided sensory deficit present    Seizure ([place])    Stroke ([place])     Tobacco History: Social History   Tobacco Use  Smoking Status Never  Smokeless Tobacco Never   Counseling given: Not Answered   Outpatient Encounter Medications as of 05/13/2022  Medication Sig   famotidine (PEPCID) 20 MG  tablet Take 1 tablet (20 mg total) by mouth 2 (two) times daily.   fluticasone (FLONASE) 50 MCG/ACT nasal spray Place 2 sprays into both nostrils daily.   loperamide (ANTI-DIARRHEAL) 2 MG tablet Take 2 tablets (4 mg total) by mouth as needed for diarrhea or loose stools.   traZODone (DESYREL) 50 MG tablet Take 0.5-1 tablets (25-50 mg total) by mouth at bedtime.   [DISCONTINUED] clopidogrel (PLAVIX) 75 MG tablet Take 75 mg by mouth daily.   [DISCONTINUED] FLUoxetine (PROZAC) 20 MG tablet TOME 1 PASTILLA POR VIA ORAL CADA MANANA   [DISCONTINUED] hydrochlorothiazide (MICROZIDE) 12.5 MG capsule Take 1 capsule (12.5 mg total) by mouth daily.   [DISCONTINUED] rosuvastatin (CRESTOR) 20 MG tablet TAKE 1 TABLET (20 MG TOTAL) BY MOUTH DAILY.   clopidogrel (PLAVIX) 75 MG tablet Take 1 tablet (75 mg total) by mouth daily.   FLUoxetine (PROZAC) 20 MG tablet TOME 1 PASTILLA POR VIA ORAL CADA MANANA   hydrochlorothiazide (MICROZIDE) 12.5 MG capsule Take 1 capsule (12.5 mg total) by mouth daily.   rosuvastatin (CRESTOR) 20 MG tablet Take 1 tablet (20 mg total) by mouth daily.   [DISCONTINUED] hydrochlorothiazide (MICROZIDE) 12.5 MG capsule TAKE 1 CAPSULE (12.5 MG TOTAL) BY MOUTH DAILY.   No facility-administered encounter medications on file as of 05/13/2022.     Review of Systems  Review of Systems  Constitutional: Negative.   HENT: Negative.    Cardiovascular: Negative.   Gastrointestinal: Negative.   Allergic/Immunologic: Negative.   Neurological:  Positive for dizziness.  Psychiatric/Behavioral:  Positive for decreased concentration.        Physical Exam  BP 123/70   Pulse 64   Temp 97.8 F (36.6 C) (Temporal)   Ht 5' 5.75" (1.67 m)   Wt 191 lb (86.6 kg)   SpO2 100%   BMI 31.06 kg/m   Wt Readings from Last 5 Encounters:  05/13/22 191 lb (86.6 kg)  01/13/22 190 lb 14.7 oz (86.6 kg)  11/09/21 191 lb (86.6 kg)  03/30/21 192 lb 2 oz (87.1 kg)  03/06/20 183 lb 3.2 oz (83.1 kg)      Physical Exam Vitals and nursing note reviewed.  Constitutional:      General: He is not in acute distress.    Appearance: He is well-developed.  Cardiovascular:     Rate and Rhythm: Normal rate and regular rhythm.  Pulmonary:     Effort: Pulmonary effort is normal.     Breath sounds: Normal breath sounds.  Musculoskeletal:     Comments: Residual left side weakness noted  Skin:    General: Skin is warm and dry.  Neurological:     Mental Status: He is alert and oriented to person, place, and time.       Assessment & Plan:   Essential hypertension - hydrochlorothiazide (MICROZIDE) 12.5 MG capsule; Take 1 capsule (12.5 mg total) by mouth daily.  Dispense: 90 capsule; Refill: 3 - rosuvastatin (CRESTOR) 20 MG tablet; TAKE 1 TABLET (20 MG TOTAL) BY MOUTH DAILY.  Dispense: 90 tablet; Refill: 3 - Hemoglobin A1c - CBC - Comprehensive metabolic panel - Lipid Panel  2. Current mild episode of major depressive disorder without prior episode (HCC)  - FLUoxetine (PROZAC) 20 MG tablet; TOME 1 PASTILLA POR VIA ORAL CADA MANANA  Dispense: 90 tablet; Refill: 3  3. Insomnia, unspecified type   4. Cerebrovascular accident (CVA), unspecified mechanism ([place])  - clopidogrel (PLAVIX) 75 MG tablet; Take 1 tablet (75 mg total) by mouth daily.  Dispense: 90 tablet; Refill: 0 - rosuvastatin (CRESTOR) 20 MG tablet; TAKE 1 TABLET (20 MG TOTAL) BY MOUTH DAILY.  Dispense: 90 tablet; Refill: 3 - Ambulatory referral to Neurology - Hemoglobin A1c - CBC - Comprehensive metabolic panel - Lipid Panel  5. Dizziness  - Ambulatory referral to Neurology  6. Memory loss  - Ambulatory referral to Neurology  7. Lipid screening  - Lipid Panel  8. Skin cancer screening  - Ambulatory referral to Dermatology  Follow up:  Follow up in 3 months     Fenton Foy, NP 05/14/2022

## 2022-05-14 ENCOUNTER — Encounter: Payer: Self-pay | Admitting: Nurse Practitioner

## 2022-05-14 LAB — LIPID PANEL
Chol/HDL Ratio: 3.7 ratio (ref 0.0–5.0)
Cholesterol, Total: 216 mg/dL — ABNORMAL HIGH (ref 100–199)
HDL: 59 mg/dL (ref >39)
LDL Chol Calc (NIH): 127 mg/dL — ABNORMAL HIGH (ref 0–99)
Triglycerides: 172 mg/dL — ABNORMAL HIGH (ref 0–149)
VLDL Cholesterol Cal: 30 mg/dL (ref 5–40)

## 2022-05-14 LAB — CBC
Hematocrit: 46.1 % (ref 37.5–51.0)
Hemoglobin: 15.1 g/dL (ref 13.0–17.7)
MCH: 30.9 pg (ref 26.6–33.0)
MCHC: 32.8 g/dL (ref 31.5–35.7)
MCV: 94 fL (ref 79–97)
Platelets: 275 10*3/uL (ref 150–450)
RBC: 4.89 x10E6/uL (ref 4.14–5.80)
RDW: 13.4 % (ref 11.6–15.4)
WBC: 6.5 10*3/uL (ref 3.4–10.8)

## 2022-05-14 LAB — COMPREHENSIVE METABOLIC PANEL
ALT: 24 IU/L (ref 0–44)
AST: 24 IU/L (ref 0–40)
Albumin/Globulin Ratio: 1.6 (ref 1.2–2.2)
Albumin: 4.5 g/dL (ref 3.9–4.9)
Alkaline Phosphatase: 52 IU/L (ref 44–121)
BUN/Creatinine Ratio: 11 (ref 10–24)
BUN: 11 mg/dL (ref 8–27)
Bilirubin Total: 0.3 mg/dL (ref 0.0–1.2)
CO2: 18 mmol/L — ABNORMAL LOW (ref 20–29)
Calcium: 9.7 mg/dL (ref 8.6–10.2)
Chloride: 103 mmol/L (ref 96–106)
Creatinine, Ser: 0.96 mg/dL (ref 0.76–1.27)
Globulin, Total: 2.8 g/dL (ref 1.5–4.5)
Glucose: 113 mg/dL — ABNORMAL HIGH (ref 70–99)
Potassium: 4.6 mmol/L (ref 3.5–5.2)
Sodium: 141 mmol/L (ref 134–144)
Total Protein: 7.3 g/dL (ref 6.0–8.5)
eGFR: 86 mL/min/{1.73_m2} (ref >59)

## 2022-05-14 LAB — HEMOGLOBIN A1C
Est. average glucose Bld gHb Est-mCnc: 126 mg/dL
Hgb A1c MFr Bld: 6 % — ABNORMAL HIGH (ref 4.8–5.6)

## 2022-05-14 NOTE — Assessment & Plan Note (Signed)
-  hydrochlorothiazide (MICROZIDE) 12.5 MG capsule; Take 1 capsule (12.5 mg total) by mouth daily.  Dispense: 90 capsule; Refill: 3 - rosuvastatin (CRESTOR) 20 MG tablet; TAKE 1 TABLET (20 MG TOTAL) BY MOUTH DAILY.  Dispense: 90 tablet; Refill: 3 - Hemoglobin A1c - CBC - Comprehensive metabolic panel - Lipid Panel  2. Current mild episode of major depressive disorder without prior episode (HCC)  - FLUoxetine (PROZAC) 20 MG tablet; TOME 1 PASTILLA POR VIA ORAL CADA MANANA  Dispense: 90 tablet; Refill: 3  3. Insomnia, unspecified type   4. Cerebrovascular accident (CVA), unspecified mechanism ([place])  - clopidogrel (PLAVIX) 75 MG tablet; Take 1 tablet (75 mg total) by mouth daily.  Dispense: 90 tablet; Refill: 0 - rosuvastatin (CRESTOR) 20 MG tablet; TAKE 1 TABLET (20 MG TOTAL) BY MOUTH DAILY.  Dispense: 90 tablet; Refill: 3 - Ambulatory referral to Neurology - Hemoglobin A1c - CBC - Comprehensive metabolic panel - Lipid Panel  5. Dizziness  - Ambulatory referral to Neurology  6. Memory loss  - Ambulatory referral to Neurology  7. Lipid screening  - Lipid Panel  8. Skin cancer screening  - Ambulatory referral to Dermatology  Follow up:  Follow up in 3 months

## 2022-05-17 ENCOUNTER — Other Ambulatory Visit: Payer: Self-pay

## 2022-06-04 ENCOUNTER — Other Ambulatory Visit: Payer: Self-pay

## 2022-06-09 ENCOUNTER — Other Ambulatory Visit: Payer: Self-pay

## 2022-08-12 ENCOUNTER — Other Ambulatory Visit: Payer: Self-pay

## 2022-08-12 ENCOUNTER — Ambulatory Visit (INDEPENDENT_AMBULATORY_CARE_PROVIDER_SITE_OTHER): Payer: Self-pay | Admitting: Nurse Practitioner

## 2022-08-12 VITALS — BP 132/80 | HR 65 | Temp 97.4°F | Ht 67.0 in | Wt 201.6 lb

## 2022-08-12 DIAGNOSIS — R42 Dizziness and giddiness: Secondary | ICD-10-CM

## 2022-08-12 DIAGNOSIS — I639 Cerebral infarction, unspecified: Secondary | ICD-10-CM

## 2022-08-12 DIAGNOSIS — Z13220 Encounter for screening for lipoid disorders: Secondary | ICD-10-CM

## 2022-08-12 MED ORDER — MECLIZINE HCL 12.5 MG PO TABS
12.5000 mg | ORAL_TABLET | Freq: Two times a day (BID) | ORAL | 0 refills | Status: DC | PRN
  Filled 2022-08-12 – 2022-09-24 (×2): qty 30, 15d supply, fill #0

## 2022-08-12 NOTE — Progress Notes (Signed)
@Patient  ID: Mark Robles, male    DOB: 1950-08-30, 72 y.o.   MRN: [id number]  Chief Complaint  Patient presents with   Follow-up    Referring provider: Ivonne Andrew, NP   HPI  Mark Robles 72 y.o. male  has a past medical history of Depression, DVT (deep venous thrombosis) (HCC), Hyperlipidemia, Left-sided sensory deficit present, Seizure (HCC), and Stroke (HCC).  To the [facility] for reevaluation of dizziness, medication refill and wellness exam.  Patient presents today for a follow-up visit.  Since his last appointment here he has scheduled a neurology appointment for this upcoming July.  He states that he does still have ongoing dizziness and memory loss.  Patient does have a past history of stroke.  We will reorder physical therapy for him.  We have ordered this in the past but family did not take him to the appointments. Denies f/c/s, n/v/d, hemoptysis, PND, leg swelling Denies chest pain or edema      Allergies  Allergen Reactions   Amlodipine Itching and Swelling    Immunization History  Administered Date(s) Administered   Pneumococcal Conjugate-13 07/13/2018   Pneumococcal Polysaccharide-23 05/16/2017   Tdap 05/16/2017   Unspecified SARS-COV-2 Vaccination 05/30/2019, 07/25/2019    Past Medical History:  Diagnosis Date   Depression    DVT (deep venous thrombosis) (HCC)    Hyperlipidemia    Left-sided sensory deficit present    Seizure (HCC)    Stroke (HCC)     Tobacco History: Social History   Tobacco Use  Smoking Status Never  Smokeless Tobacco Never   Counseling given: Not Answered   Outpatient Encounter Medications as of 08/12/2022  Medication Sig   clopidogrel (PLAVIX) 75 MG tablet Take 1 tablet (75 mg total) by mouth daily.   FLUoxetine (PROZAC) 20 MG tablet TOME 1 PASTILLA POR VIA ORAL CADA MANANA   fluticasone (FLONASE) 50 MCG/ACT nasal spray Place 2 sprays into both nostrils daily.   hydrochlorothiazide  (MICROZIDE) 12.5 MG capsule Take 1 capsule (12.5 mg total) by mouth daily.   meclizine (ANTIVERT) 12.5 MG tablet Take 1 tablet (12.5 mg total) by mouth 2 (two) times daily as needed for dizziness.   rosuvastatin (CRESTOR) 20 MG tablet Take 1 tablet (20 mg total) by mouth daily.   traZODone (DESYREL) 50 MG tablet Take 0.5-1 tablets (25-50 mg total) by mouth at bedtime.   famotidine (PEPCID) 20 MG tablet Take 1 tablet (20 mg total) by mouth 2 (two) times daily.   loperamide (ANTI-DIARRHEAL) 2 MG tablet Take 2 tablets (4 mg total) by mouth as needed for diarrhea or loose stools. (Patient not taking: Reported on 08/12/2022)   No facility-administered encounter medications on file as of 08/12/2022.     Review of Systems  Review of Systems  Constitutional: Negative.   HENT: Negative.    Cardiovascular: Negative.   Gastrointestinal: Negative.   Allergic/Immunologic: Negative.   Neurological:  Positive for dizziness.  Psychiatric/Behavioral: Negative.         Physical Exam  BP 132/80   Pulse 65   Temp (!) 97.4 F (36.3 C)   Ht 5\' 7"  (1.702 m)   Wt 201 lb 9.6 oz (91.4 kg)   SpO2 97%   BMI 31.58 kg/m   Wt Readings from Last 5 Encounters:  08/12/22 201 lb 9.6 oz (91.4 kg)  05/13/22 191 lb (86.6 kg)  01/13/22 190 lb 14.7 oz (86.6 kg)  11/09/21 191 lb (86.6 kg)  03/30/21 192 lb 2 oz (87.1  kg)     Physical Exam Vitals and nursing note reviewed.  Constitutional:      General: He is not in acute distress.    Appearance: He is well-developed.  Cardiovascular:     Rate and Rhythm: Normal rate and regular rhythm.  Pulmonary:     Effort: Pulmonary effort is normal.     Breath sounds: Normal breath sounds.  Skin:    General: Skin is warm and dry.  Neurological:     Mental Status: He is alert and oriented to person, place, and time.      Lab Results:  CBC    Component Value Date/Time   WBC 4.9 08/12/2022 1529   WBC 7.1 01/13/2022 1709   RBC 4.82 08/12/2022 1529   RBC  4.50 01/13/2022 1709   HGB 15.0 08/12/2022 1529   HCT 45.6 08/12/2022 1529   PLT 222 08/12/2022 1529   MCV 95 08/12/2022 1529   MCH 31.1 08/12/2022 1529   MCH 31.1 01/13/2022 1709   MCHC 32.9 08/12/2022 1529   MCHC 33.9 01/13/2022 1709   RDW 13.8 08/12/2022 1529   LYMPHSABS 1.0 01/13/2022 1709   LYMPHSABS 1.6 03/30/2021 1457   MONOABS 0.8 01/13/2022 1709   EOSABS 0.1 01/13/2022 1709   EOSABS 0.1 03/30/2021 1457   BASOSABS 0.0 01/13/2022 1709   BASOSABS 0.0 03/30/2021 1457    BMET    Component Value Date/Time   NA 141 08/12/2022 1529   K 4.3 08/12/2022 1529   CL 103 08/12/2022 1529   CO2 22 08/12/2022 1529   GLUCOSE 125 (H) 08/12/2022 1529   GLUCOSE 107 (H) 01/13/2022 1709   BUN 14 08/12/2022 1529   CREATININE 1.03 08/12/2022 1529   CALCIUM 9.0 08/12/2022 1529   GFRNONAA >60 01/13/2022 1709   GFRAA 107 03/06/2020 1212    BNP    Component Value Date/Time   BNP 13.4 08/09/2018 2024    ProBNP No results found for: "PROBNP"  Imaging: No results found.   Assessment & Plan:   Dizziness - meclizine (ANTIVERT) 12.5 MG tablet; Take 1 tablet (12.5 mg total) by mouth 2 (two) times daily as needed for dizziness.  Dispense: 30 tablet; Refill: 0 - Ambulatory referral to Physical Therapy - CBC - Comprehensive metabolic panel - Ambulatory referral to Cardiology  2. Cerebrovascular accident (CVA), unspecified mechanism (HCC)  - Ambulatory referral to Physical Therapy - Ambulatory referral to Cardiology  3. Lipid screening  - Lipid Panel  Follow up:  Follow up in 6 months     Ivonne Andrew, NP 08/13/2022

## 2022-08-13 ENCOUNTER — Encounter: Payer: Self-pay | Admitting: Nurse Practitioner

## 2022-08-13 DIAGNOSIS — R42 Dizziness and giddiness: Secondary | ICD-10-CM | POA: Insufficient documentation

## 2022-08-13 LAB — CBC
Hematocrit: 45.6 % (ref 37.5–51.0)
Hemoglobin: 15 g/dL (ref 13.0–17.7)
MCH: 31.1 pg (ref 26.6–33.0)
MCHC: 32.9 g/dL (ref 31.5–35.7)
MCV: 95 fL (ref 79–97)
Platelets: 222 10*3/uL (ref 150–450)
RBC: 4.82 x10E6/uL (ref 4.14–5.80)
RDW: 13.8 % (ref 11.6–15.4)
WBC: 4.9 10*3/uL (ref 3.4–10.8)

## 2022-08-13 LAB — COMPREHENSIVE METABOLIC PANEL
ALT: 29 IU/L (ref 0–44)
AST: 23 IU/L (ref 0–40)
Albumin/Globulin Ratio: 1.9 (ref 1.2–2.2)
Albumin: 4.6 g/dL (ref 3.9–4.9)
Alkaline Phosphatase: 51 IU/L (ref 44–121)
BUN/Creatinine Ratio: 14 (ref 10–24)
BUN: 14 mg/dL (ref 8–27)
Bilirubin Total: 0.3 mg/dL (ref 0.0–1.2)
CO2: 22 mmol/L (ref 20–29)
Calcium: 9 mg/dL (ref 8.6–10.2)
Chloride: 103 mmol/L (ref 96–106)
Creatinine, Ser: 1.03 mg/dL (ref 0.76–1.27)
Globulin, Total: 2.4 g/dL (ref 1.5–4.5)
Glucose: 125 mg/dL — ABNORMAL HIGH (ref 70–99)
Potassium: 4.3 mmol/L (ref 3.5–5.2)
Sodium: 141 mmol/L (ref 134–144)
Total Protein: 7 g/dL (ref 6.0–8.5)
eGFR: 79 mL/min/{1.73_m2} (ref >59)

## 2022-08-13 LAB — LIPID PANEL
Chol/HDL Ratio: 2.5 ratio (ref 0.0–5.0)
Cholesterol, Total: 150 mg/dL (ref 100–199)
HDL: 61 mg/dL (ref >39)
LDL Chol Calc (NIH): 54 mg/dL (ref 0–99)
Triglycerides: 220 mg/dL — ABNORMAL HIGH (ref 0–149)
VLDL Cholesterol Cal: 35 mg/dL (ref 5–40)

## 2022-08-13 NOTE — Assessment & Plan Note (Signed)
-   meclizine (ANTIVERT) 12.5 MG tablet; Take 1 tablet (12.5 mg total) by mouth 2 (two) times daily as needed for dizziness.  Dispense: 30 tablet; Refill: 0 - Ambulatory referral to Physical Therapy - CBC - Comprehensive metabolic panel - Ambulatory referral to Cardiology  2. Cerebrovascular accident (CVA), unspecified mechanism (HCC)  - Ambulatory referral to Physical Therapy - Ambulatory referral to Cardiology  3. Lipid screening  - Lipid Panel  Follow up:  Follow up in 6 months

## 2022-08-13 NOTE — Patient Instructions (Signed)
1. Dizziness  - meclizine (ANTIVERT) 12.5 MG tablet; Take 1 tablet (12.5 mg total) by mouth 2 (two) times daily as needed for dizziness.  Dispense: 30 tablet; Refill: 0 - Ambulatory referral to Physical Therapy - CBC - Comprehensive metabolic panel - Ambulatory referral to Cardiology  2. Cerebrovascular accident (CVA), unspecified mechanism (HCC)  - Ambulatory referral to Physical Therapy - Ambulatory referral to Cardiology  3. Lipid screening  - Lipid Panel  Follow up:  Follow up in 6 months

## 2022-08-18 ENCOUNTER — Other Ambulatory Visit: Payer: Self-pay

## 2022-08-26 ENCOUNTER — Other Ambulatory Visit: Payer: Self-pay

## 2022-09-24 ENCOUNTER — Other Ambulatory Visit: Payer: Self-pay | Admitting: Nurse Practitioner

## 2022-09-24 ENCOUNTER — Other Ambulatory Visit: Payer: Self-pay

## 2022-09-24 NOTE — Telephone Encounter (Signed)
Please advise [facility] 

## 2022-09-27 ENCOUNTER — Other Ambulatory Visit: Payer: Self-pay

## 2022-09-27 MED ORDER — LOPERAMIDE HCL 2 MG PO CAPS
4.0000 mg | ORAL_CAPSULE | ORAL | 0 refills | Status: DC | PRN
  Filled 2022-09-27 – 2022-10-28 (×2): qty 24, 12d supply, fill #0

## 2022-10-04 ENCOUNTER — Other Ambulatory Visit: Payer: Self-pay

## 2022-10-28 ENCOUNTER — Other Ambulatory Visit: Payer: Self-pay | Admitting: Nurse Practitioner

## 2022-10-28 DIAGNOSIS — R42 Dizziness and giddiness: Secondary | ICD-10-CM

## 2022-10-28 DIAGNOSIS — G4700 Insomnia, unspecified: Secondary | ICD-10-CM

## 2022-10-29 ENCOUNTER — Other Ambulatory Visit: Payer: Self-pay

## 2022-10-29 MED ORDER — MECLIZINE HCL 12.5 MG PO TABS
12.5000 mg | ORAL_TABLET | Freq: Two times a day (BID) | ORAL | 0 refills | Status: DC | PRN
  Filled 2022-10-29 – 2022-11-08 (×2): qty 30, 15d supply, fill #0

## 2022-10-29 MED ORDER — TRAZODONE HCL 50 MG PO TABS
25.0000 mg | ORAL_TABLET | Freq: Every evening | ORAL | 3 refills | Status: DC
  Filled 2022-10-29: qty 90, 90d supply, fill #0
  Filled 2022-12-07: qty 30, 30d supply, fill #0
  Filled 2023-01-03: qty 90, 90d supply, fill #0
  Filled 2023-02-06 – 2023-05-16 (×4): qty 90, 90d supply, fill #1
  Filled 2023-06-19 – 2023-07-24 (×2): qty 90, 90d supply, fill #2

## 2022-11-08 ENCOUNTER — Other Ambulatory Visit: Payer: Self-pay

## 2022-11-10 ENCOUNTER — Other Ambulatory Visit: Payer: Self-pay

## 2022-12-07 ENCOUNTER — Other Ambulatory Visit: Payer: Self-pay

## 2022-12-07 ENCOUNTER — Other Ambulatory Visit: Payer: Self-pay | Admitting: Nurse Practitioner

## 2022-12-07 DIAGNOSIS — R42 Dizziness and giddiness: Secondary | ICD-10-CM

## 2022-12-08 ENCOUNTER — Other Ambulatory Visit: Payer: Self-pay

## 2022-12-08 MED ORDER — MECLIZINE HCL 12.5 MG PO TABS
12.5000 mg | ORAL_TABLET | Freq: Two times a day (BID) | ORAL | 0 refills | Status: DC | PRN
  Filled 2022-12-08: qty 30, 15d supply, fill #0

## 2023-01-03 ENCOUNTER — Other Ambulatory Visit: Payer: Self-pay

## 2023-01-03 ENCOUNTER — Other Ambulatory Visit: Payer: Self-pay | Admitting: Nurse Practitioner

## 2023-01-03 DIAGNOSIS — R42 Dizziness and giddiness: Secondary | ICD-10-CM

## 2023-01-03 MED ORDER — MECLIZINE HCL 12.5 MG PO TABS
12.5000 mg | ORAL_TABLET | Freq: Two times a day (BID) | ORAL | 0 refills | Status: DC | PRN
  Filled 2023-01-03: qty 30, 15d supply, fill #0

## 2023-01-04 ENCOUNTER — Other Ambulatory Visit: Payer: Self-pay

## 2023-02-06 ENCOUNTER — Other Ambulatory Visit: Payer: Self-pay | Admitting: Nurse Practitioner

## 2023-02-06 DIAGNOSIS — R42 Dizziness and giddiness: Secondary | ICD-10-CM

## 2023-02-07 ENCOUNTER — Other Ambulatory Visit: Payer: Self-pay

## 2023-02-07 MED ORDER — MECLIZINE HCL 12.5 MG PO TABS
12.5000 mg | ORAL_TABLET | Freq: Two times a day (BID) | ORAL | 0 refills | Status: DC | PRN
  Filled 2023-02-07: qty 30, 15d supply, fill #0

## 2023-02-07 NOTE — Telephone Encounter (Signed)
Please advise [facility] 

## 2023-02-09 ENCOUNTER — Other Ambulatory Visit: Payer: Self-pay

## 2023-02-11 ENCOUNTER — Encounter: Payer: Self-pay | Admitting: Nurse Practitioner

## 2023-02-11 ENCOUNTER — Ambulatory Visit (INDEPENDENT_AMBULATORY_CARE_PROVIDER_SITE_OTHER): Payer: Self-pay | Admitting: Nurse Practitioner

## 2023-02-11 VITALS — BP 130/86 | HR 73 | Temp 98.4°F | Ht 64.5 in | Wt 197.2 lb

## 2023-02-11 DIAGNOSIS — I639 Cerebral infarction, unspecified: Secondary | ICD-10-CM

## 2023-02-11 DIAGNOSIS — R531 Weakness: Secondary | ICD-10-CM

## 2023-02-11 DIAGNOSIS — Z23 Encounter for immunization: Secondary | ICD-10-CM

## 2023-02-11 DIAGNOSIS — R413 Other amnesia: Secondary | ICD-10-CM

## 2023-02-11 DIAGNOSIS — M542 Cervicalgia: Secondary | ICD-10-CM

## 2023-02-11 NOTE — Addendum Note (Signed)
Addended by: Renelda Loma on: 02/11/2023 03:58 PM   Modules accepted: Orders

## 2023-02-11 NOTE — Progress Notes (Signed)
Subjective   Patient ID: Mark Robles, male    DOB: 02-Jan-1951, 72 y.o.   MRN: [id number]  Chief Complaint  Patient presents with   Medical Management of Chronic Issues    Referring provider: Ivonne Andrew, NP  Mark Robles is a 72 y.o. male with Past Medical History: No date: Depression No date: DVT (deep venous thrombosis) (HCC) No date: Hyperlipidemia No date: Left-sided sensory deficit present No date: Seizure (HCC) No date: Stroke [facility])  HPI  Patient presents today for follow-up visit.  Overall he has been stable since his last visit here.  He does still complain of dizziness.  Cannot tell if the meclizine is helping or not.  Patient failed to follow-up with cardiology, neurology, physical therapy after recent stroke.  The family states that they work and cannot get him there.  We will try to get home health physical therapy to come out and evaluate him.  I have placed a new order for neurology and discussed with the family that they need to follow-up as directed. Denies f/c/s, n/v/d, hemoptysis, PND, leg swelling Denies chest pain or edema     No Active Allergies  Immunization History  Administered Date(s) Administered   Pneumococcal Conjugate-13 07/13/2018   Pneumococcal Polysaccharide-23 05/16/2017   Tdap 05/16/2017   Unspecified SARS-COV-2 Vaccination 05/30/2019, 07/25/2019    Tobacco History: Social History   Tobacco Use  Smoking Status Never  Smokeless Tobacco Never   Counseling given: Not Answered   Outpatient Encounter Medications as of 02/11/2023  Medication Sig   FLUoxetine (PROZAC) 20 MG tablet TOME 1 PASTILLA POR VIA ORAL CADA MANANA   fluticasone (FLONASE) 50 MCG/ACT nasal spray Place 2 sprays into both nostrils daily.   hydrochlorothiazide (MICROZIDE) 12.5 MG capsule Take 1 capsule (12.5 mg total) by mouth daily.   meclizine (ANTIVERT) 12.5 MG tablet Take 1 tablet (12.5 mg total) by mouth 2 (two) times  daily as needed for dizziness.   rosuvastatin (CRESTOR) 20 MG tablet Take 1 tablet (20 mg total) by mouth daily.   traZODone (DESYREL) 50 MG tablet Take 0.5-1 tablets (25-50 mg total) by mouth at bedtime.   famotidine (PEPCID) 20 MG tablet Take 1 tablet (20 mg total) by mouth 2 (two) times daily.   [DISCONTINUED] loperamide (ANTI-DIARRHEAL) 2 MG capsule Take 2 capsules (4 mg total) by mouth as needed for diarrhea or loose stools.   No facility-administered encounter medications on file as of 02/11/2023.    Review of Systems  Review of Systems  Constitutional: Negative.   HENT: Negative.    Cardiovascular: Negative.   Gastrointestinal: Negative.   Allergic/Immunologic: Negative.   Neurological: Negative.   Psychiatric/Behavioral: Negative.       Objective:   BP 130/86   Pulse 73   Temp 98.4 F (36.9 C) (Oral)   Ht 5' 4.5" (1.638 m)   Wt 197 lb 3.2 oz (89.4 kg)   SpO2 98%   BMI 33.33 kg/m   Wt Readings from Last 5 Encounters:  02/11/23 197 lb 3.2 oz (89.4 kg)  08/12/22 201 lb 9.6 oz (91.4 kg)  05/13/22 191 lb (86.6 kg)  01/13/22 190 lb 14.7 oz (86.6 kg)  11/09/21 191 lb (86.6 kg)     Physical Exam Vitals and nursing note reviewed.  Constitutional:      General: He is not in acute distress.    Appearance: He is well-developed.  Cardiovascular:     Rate and Rhythm: Normal rate and regular rhythm.  Pulmonary:  Effort: Pulmonary effort is normal.     Breath sounds: Normal breath sounds.  Skin:    General: Skin is warm and dry.  Neurological:     Mental Status: He is alert and oriented to person, place, and time.       Assessment & Plan:   Cerebrovascular accident (CVA), unspecified mechanism (HCC) -     Ambulatory referral to Neurosurgery -     CBC -     Comprehensive metabolic panel -     Ambulatory referral to Home Health  Memory loss -     Ambulatory referral to Home Health  Left-sided weakness -     Ambulatory referral to Home Health  Neck  pain -     DG Cervical Spine Complete; Future     Return in about 6 months (around 08/12/2023).   Ivonne Andrew, NP 02/11/2023

## 2023-02-11 NOTE — Patient Instructions (Addendum)
1. Cerebrovascular accident (CVA), unspecified mechanism (HCC)  - Ambulatory referral to Neurosurgery - CBC - Comprehensive metabolic panel - Ambulatory referral to Home Health  2. Memory loss  - Ambulatory referral to Home Health  3. Left-sided weakness  - Ambulatory referral to Home Health  4. Neck pain  - DG Cervical Spine Complete; Future    Follow up:  Follow up in 6 months

## 2023-02-11 NOTE — Progress Notes (Signed)
Patient states he has a lot of dizziness.   Patient wants Flu vaccine

## 2023-02-12 LAB — COMPREHENSIVE METABOLIC PANEL
ALT: 67 [IU]/L — ABNORMAL HIGH (ref 0–44)
AST: 33 [IU]/L (ref 0–40)
Albumin: 4.4 g/dL (ref 3.9–4.9)
Alkaline Phosphatase: 53 [IU]/L (ref 44–121)
BUN/Creatinine Ratio: 19 (ref 10–24)
BUN: 16 mg/dL (ref 8–27)
Bilirubin Total: 0.3 mg/dL (ref 0.0–1.2)
CO2: 23 mmol/L (ref 20–29)
Calcium: 9.5 mg/dL (ref 8.6–10.2)
Chloride: 101 mmol/L (ref 96–106)
Creatinine, Ser: 0.85 mg/dL (ref 0.76–1.27)
Globulin, Total: 2.4 g/dL (ref 1.5–4.5)
Glucose: 93 mg/dL (ref 70–99)
Potassium: 4.2 mmol/L (ref 3.5–5.2)
Sodium: 138 mmol/L (ref 134–144)
Total Protein: 6.8 g/dL (ref 6.0–8.5)
eGFR: 93 mL/min/{1.73_m2} (ref >59)

## 2023-02-12 LAB — CBC
Hematocrit: 47.2 % (ref 37.5–51.0)
Hemoglobin: 15.1 g/dL (ref 13.0–17.7)
MCH: 30.8 pg (ref 26.6–33.0)
MCHC: 32 g/dL (ref 31.5–35.7)
MCV: 96 fL (ref 79–97)
Platelets: 246 10*3/uL (ref 150–450)
RBC: 4.9 x10E6/uL (ref 4.14–5.80)
RDW: 13.8 % (ref 11.6–15.4)
WBC: 6.6 10*3/uL (ref 3.4–10.8)

## 2023-03-07 ENCOUNTER — Other Ambulatory Visit: Payer: Self-pay | Admitting: Nurse Practitioner

## 2023-03-07 ENCOUNTER — Other Ambulatory Visit: Payer: Self-pay

## 2023-03-07 DIAGNOSIS — F320 Major depressive disorder, single episode, mild: Secondary | ICD-10-CM

## 2023-03-08 ENCOUNTER — Other Ambulatory Visit: Payer: Self-pay

## 2023-03-08 MED ORDER — FLUOXETINE HCL 20 MG PO TABS
20.0000 mg | ORAL_TABLET | Freq: Every day | ORAL | 3 refills | Status: DC
  Filled 2023-03-08: qty 90, 90d supply, fill #0
  Filled 2023-04-14 – 2023-07-24 (×4): qty 90, 90d supply, fill #1
  Filled 2023-10-28 – 2023-10-31 (×2): qty 90, 90d supply, fill #2
  Filled 2024-02-04: qty 90, 90d supply, fill #3

## 2023-03-14 ENCOUNTER — Other Ambulatory Visit: Payer: Self-pay

## 2023-03-27 ENCOUNTER — Other Ambulatory Visit: Payer: Self-pay | Admitting: Nurse Practitioner

## 2023-03-27 DIAGNOSIS — R42 Dizziness and giddiness: Secondary | ICD-10-CM

## 2023-03-28 NOTE — Telephone Encounter (Signed)
Please advise [facility] 

## 2023-03-29 ENCOUNTER — Other Ambulatory Visit: Payer: Self-pay

## 2023-03-29 MED ORDER — MECLIZINE HCL 12.5 MG PO TABS
12.5000 mg | ORAL_TABLET | Freq: Two times a day (BID) | ORAL | 0 refills | Status: DC | PRN
  Filled 2023-03-29 – 2023-04-14 (×3): qty 30, 15d supply, fill #0

## 2023-03-30 ENCOUNTER — Other Ambulatory Visit: Payer: Self-pay

## 2023-04-11 ENCOUNTER — Other Ambulatory Visit: Payer: Self-pay

## 2023-04-14 ENCOUNTER — Other Ambulatory Visit: Payer: Self-pay

## 2023-04-14 ENCOUNTER — Other Ambulatory Visit: Payer: Self-pay | Admitting: Nurse Practitioner

## 2023-04-14 DIAGNOSIS — I639 Cerebral infarction, unspecified: Secondary | ICD-10-CM

## 2023-04-14 DIAGNOSIS — I10 Essential (primary) hypertension: Secondary | ICD-10-CM

## 2023-04-14 MED ORDER — ROSUVASTATIN CALCIUM 20 MG PO TABS
20.0000 mg | ORAL_TABLET | Freq: Every day | ORAL | 3 refills | Status: DC
  Filled 2023-04-14 (×2): qty 90, 90d supply, fill #0
  Filled 2023-05-16 – 2023-07-24 (×3): qty 90, 90d supply, fill #1
  Filled 2023-10-28 – 2023-10-31 (×2): qty 90, 90d supply, fill #2
  Filled 2024-02-04: qty 90, 90d supply, fill #3

## 2023-05-12 ENCOUNTER — Ambulatory Visit: Payer: Self-pay | Admitting: Dermatology

## 2023-05-16 ENCOUNTER — Other Ambulatory Visit: Payer: Self-pay

## 2023-05-16 ENCOUNTER — Other Ambulatory Visit: Payer: Self-pay | Admitting: Nurse Practitioner

## 2023-05-16 DIAGNOSIS — R42 Dizziness and giddiness: Secondary | ICD-10-CM

## 2023-05-16 DIAGNOSIS — I10 Essential (primary) hypertension: Secondary | ICD-10-CM

## 2023-05-16 MED ORDER — HYDROCHLOROTHIAZIDE 12.5 MG PO CAPS
12.5000 mg | ORAL_CAPSULE | Freq: Every day | ORAL | 3 refills | Status: DC
  Filled 2023-05-16: qty 30, 30d supply, fill #0
  Filled 2023-06-19: qty 30, 30d supply, fill #1
  Filled 2023-07-24: qty 90, 90d supply, fill #1
  Filled 2023-10-28 – 2023-10-31 (×2): qty 90, 90d supply, fill #2
  Filled 2024-02-04: qty 90, 90d supply, fill #3
  Filled 2024-04-01: qty 90, 90d supply, fill #4

## 2023-05-16 MED ORDER — MECLIZINE HCL 12.5 MG PO TABS
12.5000 mg | ORAL_TABLET | Freq: Two times a day (BID) | ORAL | 0 refills | Status: DC | PRN
  Filled 2023-05-16: qty 30, 15d supply, fill #0

## 2023-05-18 ENCOUNTER — Other Ambulatory Visit: Payer: Self-pay

## 2023-05-25 ENCOUNTER — Ambulatory Visit: Payer: Self-pay | Admitting: Dermatology

## 2023-06-19 ENCOUNTER — Other Ambulatory Visit: Payer: Self-pay | Admitting: Nurse Practitioner

## 2023-06-19 DIAGNOSIS — R42 Dizziness and giddiness: Secondary | ICD-10-CM

## 2023-06-20 ENCOUNTER — Other Ambulatory Visit: Payer: Self-pay

## 2023-06-20 MED ORDER — MECLIZINE HCL 12.5 MG PO TABS
12.5000 mg | ORAL_TABLET | Freq: Two times a day (BID) | ORAL | 0 refills | Status: DC | PRN
  Filled 2023-06-20: qty 30, 15d supply, fill #0

## 2023-06-20 NOTE — Telephone Encounter (Signed)
 Please advise [facility]

## 2023-06-21 ENCOUNTER — Other Ambulatory Visit: Payer: Self-pay

## 2023-06-22 ENCOUNTER — Other Ambulatory Visit: Payer: Self-pay

## 2023-07-24 ENCOUNTER — Other Ambulatory Visit: Payer: Self-pay | Admitting: Nurse Practitioner

## 2023-07-24 DIAGNOSIS — R42 Dizziness and giddiness: Secondary | ICD-10-CM

## 2023-07-25 ENCOUNTER — Other Ambulatory Visit (HOSPITAL_COMMUNITY): Payer: Self-pay

## 2023-07-25 ENCOUNTER — Other Ambulatory Visit: Payer: Self-pay

## 2023-07-25 MED ORDER — MECLIZINE HCL 12.5 MG PO TABS
12.5000 mg | ORAL_TABLET | Freq: Two times a day (BID) | ORAL | 0 refills | Status: DC | PRN
  Filled 2023-07-25: qty 30, 15d supply, fill #0

## 2023-07-26 ENCOUNTER — Other Ambulatory Visit: Payer: Self-pay

## 2023-08-12 ENCOUNTER — Encounter: Payer: Self-pay | Admitting: Nurse Practitioner

## 2023-08-12 ENCOUNTER — Ambulatory Visit (INDEPENDENT_AMBULATORY_CARE_PROVIDER_SITE_OTHER): Payer: Self-pay | Admitting: Nurse Practitioner

## 2023-08-12 ENCOUNTER — Other Ambulatory Visit: Payer: Self-pay

## 2023-08-12 VITALS — BP 127/74 | HR 62 | Temp 98.8°F | Wt 197.2 lb

## 2023-08-12 DIAGNOSIS — R32 Unspecified urinary incontinence: Secondary | ICD-10-CM

## 2023-08-12 DIAGNOSIS — I639 Cerebral infarction, unspecified: Secondary | ICD-10-CM

## 2023-08-12 DIAGNOSIS — Z13220 Encounter for screening for lipoid disorders: Secondary | ICD-10-CM

## 2023-08-12 DIAGNOSIS — G4700 Insomnia, unspecified: Secondary | ICD-10-CM

## 2023-08-12 DIAGNOSIS — Z125 Encounter for screening for malignant neoplasm of prostate: Secondary | ICD-10-CM

## 2023-08-12 MED ORDER — TRAZODONE HCL 100 MG PO TABS
100.0000 mg | ORAL_TABLET | Freq: Every day | ORAL | 2 refills | Status: AC
  Filled 2023-08-12 – 2023-08-24 (×2): qty 90, 90d supply, fill #0
  Filled 2023-10-28: qty 90, 90d supply, fill #1
  Filled 2024-04-01 – 2024-04-02 (×3): qty 90, 90d supply, fill #2

## 2023-08-12 MED ORDER — OMEPRAZOLE 20 MG PO CPDR
20.0000 mg | DELAYED_RELEASE_CAPSULE | Freq: Every day | ORAL | 3 refills | Status: DC
  Filled 2023-08-12 – 2023-08-24 (×2): qty 30, 30d supply, fill #0
  Filled 2023-09-25: qty 90, 90d supply, fill #1

## 2023-08-12 NOTE — Patient Instructions (Signed)
 1. Prostate cancer screening (Primary)  - PSA  2. Insomnia, unspecified type  - traZODone  (DESYREL ) 100 MG tablet; Take 1 tablet (100 mg total) by mouth at bedtime.  Dispense: 90 tablet; Refill: 2  3. Urinary incontinence, unspecified type  - Ambulatory referral to Urology

## 2023-08-12 NOTE — Progress Notes (Signed)
 Subjective   Patient ID: Mark Robles, male    DOB: March 10, 1951, 73 y.o.   MRN: [id number]  Chief Complaint  Patient presents with   Medical Management of Chronic Issues    Patient is not sleeping very good, very forgetful and his reflexes are off x 1 month ago    Referring provider: Jerrlyn Morel, NP  Daray Polgar is a 73 y.o. male with Past Medical History: No date: Depression No date: DVT (deep venous thrombosis) (HCC) No date: Hyperlipidemia No date: Left-sided sensory deficit present No date: Seizure (HCC) No date: Stroke [facility])   HPI  Patient presents today for follow-up visit.  Overall he has been stable since his last visit here.  He does still complain of dizziness.  Cannot tell if the meclizine  is helping or not.  Patient failed to follow-up with cardiology, neurology, physical therapy after recent stroke. Denies f/c/s, n/v/d, hemoptysis, PND, leg swelling. Denies chest pain or edema.     No Active Allergies  Immunization History  Administered Date(s) Administered   Fluad Trivalent(High Dose 65+) 02/11/2023   Pneumococcal Conjugate-13 07/13/2018   Pneumococcal Polysaccharide-23 05/16/2017   Tdap 05/16/2017   Unspecified SARS-COV-2 Vaccination 05/30/2019, 07/25/2019    Tobacco History: Social History   Tobacco Use  Smoking Status Never  Smokeless Tobacco Never   Counseling given: Not Answered   Outpatient Encounter Medications as of 08/12/2023  Medication Sig   hydrochlorothiazide  (MICROZIDE ) 12.5 MG capsule Take 1 capsule (12.5 mg total) by mouth daily.   meclizine  (ANTIVERT ) 12.5 MG tablet Take 1 tablet (12.5 mg total) by mouth 2 (two) times daily as needed for dizziness.   rosuvastatin  (CRESTOR ) 20 MG tablet Take 1 tablet (20 mg total) by mouth daily.   traZODone  (DESYREL ) 100 MG tablet Take 1 tablet (100 mg total) by mouth at bedtime.   [DISCONTINUED] traZODone  (DESYREL ) 50 MG tablet Take 0.5-1 tablets (25-50 mg  total) by mouth at bedtime.   famotidine  (PEPCID ) 20 MG tablet Take 1 tablet (20 mg total) by mouth 2 (two) times daily.   FLUoxetine  (PROZAC ) 20 MG tablet TOME 1 PASTILLA POR VIA ORAL CADA MANANA (Patient not taking: Reported on 08/12/2023)   fluticasone  (FLONASE ) 50 MCG/ACT nasal spray Place 2 sprays into both nostrils daily. (Patient not taking: Reported on 08/12/2023)   No facility-administered encounter medications on file as of 08/12/2023.    Review of Systems  Review of Systems  Constitutional: Negative.   HENT: Negative.    Cardiovascular: Negative.   Gastrointestinal: Negative.   Allergic/Immunologic: Negative.   Neurological: Negative.   Psychiatric/Behavioral: Negative.       Objective:   BP 127/74   Pulse 62   Temp 98.8 F (37.1 C) (Oral)   Wt 197 lb 3.2 oz (89.4 kg)   SpO2 98%   BMI 33.33 kg/m   Wt Readings from Last 5 Encounters:  08/12/23 197 lb 3.2 oz (89.4 kg)  02/11/23 197 lb 3.2 oz (89.4 kg)  08/12/22 201 lb 9.6 oz (91.4 kg)  05/13/22 191 lb (86.6 kg)  01/13/22 190 lb 14.7 oz (86.6 kg)     Physical Exam Vitals and nursing note reviewed.  Constitutional:      General: He is not in acute distress.    Appearance: He is well-developed.  Cardiovascular:     Rate and Rhythm: Normal rate and regular rhythm.  Pulmonary:     Effort: Pulmonary effort is normal.     Breath sounds: Normal breath sounds.  Skin:    General: Skin is warm and dry.  Neurological:     Mental Status: He is alert and oriented to person, place, and time.       Assessment & Plan:   Prostate cancer screening -     PSA  Insomnia, unspecified type -     traZODone  HCl; Take 1 tablet (100 mg total) by mouth at bedtime.  Dispense: 90 tablet; Refill: 2  Urinary incontinence, unspecified type -     Ambulatory referral to Urology  Cerebrovascular accident (CVA), unspecified mechanism (HCC) -     Ambulatory referral to Neurology -     CBC -     Comprehensive metabolic panel  with GFR  Lipid screening -     Lipid panel     Return in about 6 months (around 02/11/2024).     Jerrlyn Morel, NP 08/12/2023

## 2023-08-13 LAB — LIPID PANEL
Chol/HDL Ratio: 2.4 ratio (ref 0.0–5.0)
Cholesterol, Total: 135 mg/dL (ref 100–199)
HDL: 57 mg/dL (ref >39)
LDL Chol Calc (NIH): 54 mg/dL (ref 0–99)
Triglycerides: 142 mg/dL (ref 0–149)
VLDL Cholesterol Cal: 24 mg/dL (ref 5–40)

## 2023-08-13 LAB — CBC
Hematocrit: 46.6 % (ref 37.5–51.0)
Hemoglobin: 14.9 g/dL (ref 13.0–17.7)
MCH: 30.7 pg (ref 26.6–33.0)
MCHC: 32 g/dL (ref 31.5–35.7)
MCV: 96 fL (ref 79–97)
Platelets: 213 10*3/uL (ref 150–450)
RBC: 4.85 x10E6/uL (ref 4.14–5.80)
RDW: 13.8 % (ref 11.6–15.4)
WBC: 4.8 10*3/uL (ref 3.4–10.8)

## 2023-08-13 LAB — COMPREHENSIVE METABOLIC PANEL WITH GFR
ALT: 36 IU/L (ref 0–44)
AST: 26 IU/L (ref 0–40)
Albumin: 4.4 g/dL (ref 3.9–4.9)
Alkaline Phosphatase: 50 IU/L (ref 44–121)
BUN/Creatinine Ratio: 12 (ref 10–24)
BUN: 11 mg/dL (ref 8–27)
Bilirubin Total: 0.3 mg/dL (ref 0.0–1.2)
CO2: 23 mmol/L (ref 20–29)
Calcium: 9.5 mg/dL (ref 8.6–10.2)
Chloride: 100 mmol/L (ref 96–106)
Creatinine, Ser: 0.93 mg/dL (ref 0.76–1.27)
Globulin, Total: 2.2 g/dL (ref 1.5–4.5)
Glucose: 169 mg/dL — ABNORMAL HIGH (ref 70–99)
Potassium: 4.4 mmol/L (ref 3.5–5.2)
Sodium: 139 mmol/L (ref 134–144)
Total Protein: 6.6 g/dL (ref 6.0–8.5)
eGFR: 88 mL/min/{1.73_m2} (ref >59)

## 2023-08-13 LAB — PSA: Prostate Specific Ag, Serum: 0.9 ng/mL (ref 0.0–4.0)

## 2023-08-23 ENCOUNTER — Other Ambulatory Visit: Payer: Self-pay

## 2023-08-24 ENCOUNTER — Other Ambulatory Visit: Payer: Self-pay | Admitting: Nurse Practitioner

## 2023-08-24 DIAGNOSIS — R42 Dizziness and giddiness: Secondary | ICD-10-CM

## 2023-08-25 ENCOUNTER — Other Ambulatory Visit: Payer: Self-pay

## 2023-08-25 MED ORDER — MECLIZINE HCL 12.5 MG PO TABS
12.5000 mg | ORAL_TABLET | Freq: Two times a day (BID) | ORAL | 0 refills | Status: DC | PRN
  Filled 2023-08-25: qty 30, 15d supply, fill #0

## 2023-08-26 ENCOUNTER — Other Ambulatory Visit: Payer: Self-pay

## 2023-09-25 ENCOUNTER — Other Ambulatory Visit: Payer: Self-pay | Admitting: Nurse Practitioner

## 2023-09-25 DIAGNOSIS — R42 Dizziness and giddiness: Secondary | ICD-10-CM

## 2023-09-26 ENCOUNTER — Other Ambulatory Visit: Payer: Self-pay

## 2023-09-26 MED ORDER — MECLIZINE HCL 12.5 MG PO TABS
12.5000 mg | ORAL_TABLET | Freq: Two times a day (BID) | ORAL | 0 refills | Status: DC | PRN
Start: 2023-09-26 — End: 2023-10-28
  Filled 2023-09-26: qty 30, 15d supply, fill #0

## 2023-09-26 NOTE — Telephone Encounter (Signed)
 Please advise [facility]

## 2023-09-27 ENCOUNTER — Other Ambulatory Visit: Payer: Self-pay

## 2023-10-28 ENCOUNTER — Other Ambulatory Visit: Payer: Self-pay | Admitting: Nurse Practitioner

## 2023-10-28 ENCOUNTER — Other Ambulatory Visit: Payer: Self-pay

## 2023-10-28 DIAGNOSIS — R42 Dizziness and giddiness: Secondary | ICD-10-CM

## 2023-10-29 ENCOUNTER — Other Ambulatory Visit: Payer: Self-pay

## 2023-10-31 ENCOUNTER — Other Ambulatory Visit: Payer: Self-pay

## 2023-10-31 MED ORDER — OMEPRAZOLE 20 MG PO CPDR
20.0000 mg | DELAYED_RELEASE_CAPSULE | Freq: Every day | ORAL | 3 refills | Status: DC
  Filled 2023-10-31: qty 30, 30d supply, fill #0
  Filled 2023-12-25: qty 90, 90d supply, fill #0
  Filled 2024-04-01 – 2024-04-02 (×2): qty 30, 30d supply, fill #1

## 2023-10-31 MED ORDER — MECLIZINE HCL 12.5 MG PO TABS
12.5000 mg | ORAL_TABLET | Freq: Two times a day (BID) | ORAL | 0 refills | Status: DC | PRN
  Filled 2023-10-31: qty 30, 15d supply, fill #0

## 2023-11-21 ENCOUNTER — Ambulatory Visit (INDEPENDENT_AMBULATORY_CARE_PROVIDER_SITE_OTHER): Payer: Self-pay | Admitting: Neurology

## 2023-11-21 ENCOUNTER — Other Ambulatory Visit: Payer: Self-pay

## 2023-11-21 ENCOUNTER — Encounter: Payer: Self-pay | Admitting: Neurology

## 2023-11-21 VITALS — BP 115/72 | HR 69 | Ht 65.0 in | Wt 197.8 lb

## 2023-11-21 DIAGNOSIS — Z8673 Personal history of transient ischemic attack (TIA), and cerebral infarction without residual deficits: Secondary | ICD-10-CM

## 2023-11-21 DIAGNOSIS — R42 Dizziness and giddiness: Secondary | ICD-10-CM

## 2023-11-21 DIAGNOSIS — G3184 Mild cognitive impairment, so stated: Secondary | ICD-10-CM

## 2023-11-21 DIAGNOSIS — F0150 Vascular dementia without behavioral disturbance: Secondary | ICD-10-CM

## 2023-11-21 DIAGNOSIS — G8114 Spastic hemiplegia affecting left nondominant side: Secondary | ICD-10-CM

## 2023-11-21 DIAGNOSIS — F0280 Dementia in other diseases classified elsewhere without behavioral disturbance: Secondary | ICD-10-CM

## 2023-11-21 DIAGNOSIS — R413 Other amnesia: Secondary | ICD-10-CM

## 2023-11-21 DIAGNOSIS — G309 Alzheimer's disease, unspecified: Secondary | ICD-10-CM

## 2023-11-21 DIAGNOSIS — M542 Cervicalgia: Secondary | ICD-10-CM

## 2023-11-21 MED ORDER — ASPIRIN 81 MG PO TBEC
81.0000 mg | DELAYED_RELEASE_TABLET | Freq: Every day | ORAL | 12 refills | Status: AC
  Filled 2023-11-21: qty 120, 120d supply, fill #0
  Filled 2024-04-01: qty 120, 120d supply, fill #1

## 2023-11-21 NOTE — Patient Instructions (Signed)
 I had a long discussion with the patient and his son using Spanish language interpreter about his remote stroke and chronic dizziness as well as more recent memory and cognitive difficulties and answered questions.  I recommend further evaluation by checking memory panel labs, EEG ,MRI scan of the brain and cervical spine.  I also advised him to do regular neck stretching exercises for his musculoskeletal neck pain as well as use local heat application.  Advised him to increase participation in cognitively challenging activities like solving crossword puzzles, playing bridge and sudoku.  We also discussed memory compensation strategies.  I encouraged him to start taking aspirin  81 mg daily for stroke prevention maintain aggressive risk factor modification with strict control of hypertension with blood pressure goal below 140/90, lipids with LDL cholesterol goal below 70 mg percent and diabetes with hemoglobin A1c goal below 6.5%.  He was also encouraged to use his cane at all times and discussed fall safety precautions.  He will return for follow-up in the future in 6 to 8 months or call earlier if necessary.  Neck Exercises Ask your health care provider which exercises are safe for you. Do exercises exactly as told by your health care provider and adjust them as directed. It is normal to feel mild stretching, pulling, tightness, or discomfort as you do these exercises. Stop right away if you feel sudden pain or your pain gets worse. Do not begin these exercises until told by your health care provider. Neck exercises can be important for many reasons. They can improve strength and maintain flexibility in your neck, which will help your upper back and prevent neck pain. Stretching exercises Rotation neck stretching  Sit in a chair or stand up. Place your feet flat on the floor, shoulder-width apart. Slowly turn your head (rotate) to the right until a slight stretch is felt. Turn it all the way to the right  so you can look over your right shoulder. Do not tilt or tip your head. Hold this position for 10-30 seconds. Slowly turn your head (rotate) to the left until a slight stretch is felt. Turn it all the way to the left so you can look over your left shoulder. Do not tilt or tip your head. Hold this position for 10-30 seconds. Repeat __________ times. Complete this exercise __________ times a day. Neck retraction  Sit in a sturdy chair or stand up. Look straight ahead. Do not bend your neck. Use your fingers to push your chin backward (retraction). Do not bend your neck for this movement. Continue to face straight ahead. If you are doing the exercise properly, you will feel a slight sensation in your throat and a stretch at the back of your neck. Hold the stretch for 1-2 seconds. Repeat __________ times. Complete this exercise __________ times a day. Strengthening exercises Neck press  Lie on your back on a firm bed or on the floor with a pillow under your head. Use your neck muscles to push your head down on the pillow and straighten your spine. Hold the position as well as you can. Keep your head facing up (in a neutral position) and your chin tucked. Slowly count to 5 while holding this position. Repeat __________ times. Complete this exercise __________ times a day. Isometrics These are exercises in which you strengthen the muscles in your neck while keeping your neck still (isometrics). Sit in a supportive chair and place your hand on your forehead. Keep your head and face facing straight ahead.  Do not flex or extend your neck while doing isometrics. Push forward with your head and neck while pushing back with your hand. Hold for 10 seconds. Do the sequence again, this time putting your hand against the back of your head. Use your head and neck to push backward against the hand pressure. Finally, do the same exercise on either side of your head, pushing sideways against the pressure of  your hand. Repeat __________ times. Complete this exercise __________ times a day. Prone head lifts  Lie face-down (prone position), resting on your elbows so that your chest and upper back are raised. Start with your head facing downward, near your chest. Position your chin either on or near your chest. Slowly lift your head upward. Lift until you are looking straight ahead. Then continue lifting your head as far back as you can comfortably stretch. Hold your head up for 5 seconds. Then slowly lower it to your starting position. Repeat __________ times. Complete this exercise __________ times a day. Supine head lifts  Lie on your back (supine position), bending your knees to point to the ceiling and keeping your feet flat on the floor. Lift your head slowly off the floor, raising your chin toward your chest. Hold for 5 seconds. Repeat __________ times. Complete this exercise __________ times a day. Scapular retraction  Stand with your arms at your sides. Look straight ahead. Slowly pull both shoulders (scapulae) backward and downward (retraction) until you feel a stretch between your shoulder blades in your upper back. Hold for 10-30 seconds. Relax and repeat. Repeat __________ times. Complete this exercise __________ times a day. Contact a health care provider if: Your neck pain or discomfort gets worse when you do an exercise. Your neck pain or discomfort does not improve within 2 hours after you exercise. If you have any of these problems, stop exercising right away. Do not do the exercises again unless your health care provider says that you can. Get help right away if: You develop sudden, severe neck pain. If this happens, stop exercising right away. Do not do the exercises again unless your health care provider says that you can. This information is not intended to replace advice given to you by your health care provider. Make sure you discuss any questions you have with your  health care provider. Document Revised: 09/30/2020 Document Reviewed: 09/30/2020 Elsevier Patient Education  2024 [employer].  Memory Compensation Strategies  Use WARM strategy.  W= write it down  A= associate it  R= repeat it  M= make a mental note  2.   You can keep a Glass blower/designer.  Use a 3-ring notebook with sections for the following: calendar, important names and phone numbers,  medications, doctors' names/phone numbers, lists/reminders, and a section to journal what you did  each day.   3.    Use a calendar to write appointments down.  4.    Write yourself a schedule for the day.  This can be placed on the calendar or in a separate section of the Memory Notebook.  Keeping a  regular schedule can help memory.  5.    Use medication organizer with sections for each day or morning/evening pills.  You may need help loading it  6.    Keep a basket, or pegboard by the door.  Place items that you need to take out with you in the basket or on the pegboard.  You may also want to  include a message board for reminders.  7.    Use sticky notes.  Place sticky notes with reminders in a place where the task is performed.  For example:  turn off the  stove placed by the stove, lock the door placed on the door at eye level,  take your medications on  the bathroom mirror or by the place where you normally take your medications.  8.    Use alarms/timers.  Use while cooking to remind yourself to check on food or as a reminder to take your medicine, or as a  reminder to make a call, or as a reminder to perform another task, etc.  Prevencin de cadas en el hogar, en adultos Fall Prevention in the Home, Adult Las cadas pueden causar lesiones y Audiological scientist a Dealer de todas las edades. Hay muchas cosas simples que puede hacer para que su casa sea un lugar seguro y ayudar a prevenir las cadas. Pida ayuda cuando realice estos cambios, si la necesita. Qu medidas puedo tomar para  prevenir cadas? Informacin general Use una buena iluminacin en todos los [place]. Asegrese de hacer lo siguiente: Reemplace las bombillas que se hayan quemado. Encienda las luces si est oscuro y use luces nocturnas. Mantenga los objetos que usa  con frecuencia en lugares de fcil acceso. Baje los estantes de toda la casa de ser necesario. Disponga los muebles de modo tal que haya espacio para caminar a su alrededor. No tenga alfombras ni otros objetos en el piso que puedan hacerlo tropezar. Si los pisos estn desparejos, reprelos. Agregue pintura o cinta de color o contraste para Cabin crew con claridad y poder ver: Las barras para sostn o los pasamanos. El Engineer, maintenance y el ltimo escaln de las escaleras. Dnde est el borde de cada escaln. Si usa  una escalera o una escalera de mano: Asegrese de que est abierta por completo. No suba a una escalera cerrada. Asegrese de que ambos lados de la escalera estn asegurados en su lugar. Pdale a alguien que sostenga la escalera mientras usted la est [place]. Sepa dnde estn sus mascotas cuando se desplace por su casa. Qu puedo hacer en el bao?     Mantenga el piso seco. Limpie de inmediato el agua que est en piso. Elimine la acumulacin de jabn en la baera o la ducha. La acumulacin hace que las baeras y duchas sean resbaladizas. Utilice alfombras o pegatinas antideslizantes en el piso de la baera o la ducha. Asegure las alfombras del bao con una cinta antideslizante doble faz para alfombras. Si necesita sentarse mientras se ducha, use un banco antideslizante. Instale barras para sostn al lado del inodoro y en la baera y la ducha. No use los toalleros como barras para sostn. Qu puedo hacer en el dormitorio? Asegrese de tener una luz junto a la cama que sea fcil de [place]. No use sbanas ni mantas para la cama que caigan al piso. Tenga un banco o una silla firme con brazos laterales que pueda usar como apoyo cuando se  vista. Qu puedo hacer en la cocina? Limpie de inmediato cualquier derrame. Si necesita alcanzar algo que est alto, use un banco escalera firme con una barra para sostn. Mantenga los cables elctricos fuera del camino. No use un pulidor o cera para pisos que dejen los pisos resbaladizos. Qu puedo hacer con las escaleras? No deje nada en las escaleras. Asegrese de tener un interruptor de luz en la parte superior e inferior de las escaleras. Si no lo tiene, instale uno. Asegrese de que haya pasamanos en ambos  lados de las escaleras. Repare los pasamanos que estn flojos o rotos. Asegrese de que los pasamanos tengan la misma longitud que las escaleras. Instale peldaos antideslizantes en todas las escaleras de su casa si no tienen alfombra. Evite colocar alfombras en la parte superior o inferior de las escaleras, o asegure las alfombras con cinta adhesiva para alfombras a fin de evitar que se muevan. Para las escaleras, elija un diseo de alfombra que no oculte el borde de los [employer] Asegrese de que las alfombras estn bien adheridas a las escaleras. Arregle las alfombras flojas o gastadas. Qu puedo hacer en el exterior de mi casa? Use una iluminacin brillante en el exterior. Repare peridicamente los bordes de los pasillos y las [place], as como las grietas. Retire los [employer] que estn en el camino y que puedan hacer que se tropiece, como herramientas o piedras. Agregue pintura o cinta de color o contraste para Cabin crew con claridad y poder ver los umbrales en las puertas que sean altos. Pode los arbustos o los rboles que se encuentren en el sendero principal que lleva a su casa. Verifique que los pasamanos estn bien ajustados y en buen [place]. Ambos lados de los escalones deben tener pasamanos. Instale barandillas de proteccin en los bordes de las terrazas o galeras elevadas. Limpie regularmente las hojas, la nieve y el hielo. Use arena, sal o un fundidor de hielo en los  senderos si vive donde hay hielo y [employer] meses de invierno. En el garaje, limpie de inmediato cualquier derrame, incluidos los derrames de grasa y aceite. Qu otras medidas puedo tomar? Revise los medicamentos con el mdico. Algunos medicamentos pueden causarle confusin o mareo. Esto puede aumentar el riesgo de caerse. Use calzado cerrado en los dedos que le calce bien y le amortige los pies. Use calzado con suela de goma y taco bajo. Use un bastn, un andador, un escter o muletas que lo ayuden a moverse si es necesario. Hable con el mdico sobre [address] de reducir el riesgo de cadas. Esto puede incluir ver a un fisioterapeuta con el fin de aprender a hacer ejercicios para mejorar el movimiento y fortalecerse. Dnde buscar ms informacin Centers for Disease Control and Prevention (Centros para el Control y la Prevencin de [place]), STEADI: TonerPromos.no [employer] on Aging (Instituto Nacional sobre el Envejecimiento): BaseRingTones.pl National Institute on Aging (Instituto Nacional sobre el Envejecimiento): BaseRingTones.pl Comunquese con un mdico si: Tiene miedo de caerse en su casa. Se siente dbil, somnoliento o mareado en su casa. Se cae en su casa. Solicite ayuda de inmediato si: Pierde la conciencia o tiene problemas para moverse luego de Engineer, site. Tiene una cada que causa una lesin en la cabeza. Estos sntomas pueden Customer service manager. Solicite ayuda de inmediato. Llame al 911. No espere a ver si los sntomas desaparecen. No conduzca por sus propios medios [employer]. Esta informacin no tiene Theme park manager el consejo del mdico. Asegrese de hacerle al mdico cualquier pregunta que tenga. Document Revised: 01/06/2022 Document Reviewed: 01/06/2022 Elsevier Patient Education  2024 [employer].

## 2023-11-21 NOTE — Progress Notes (Signed)
 [facility] [address] [place]. [place] [address] [phone]       OFFICE CONSULT NOTE  [name] Date of Birth:  08-17-1950 Medical Record Number:  [id number]   Referring MD: Bascom Borer, NP  Reason for Referral: Memory loss and dizziness  HPI: [name] is a 72 year old Hispanic male seen today for office follow-up visit.  He is accompanied by his son and Spanish language interpreter and history is obtained from them and review of electronic medical records and I personally reviewed pertinent available imaging films in PACS.  He has past medical history of hypertension, DVT, hyperlipidemia, depression and strokes.  His first r stroke was in 2002 which was described as being very small due to high cholesterol and recovered quickly.  In 2007 and 2008 he had another stroke in [place]  and then a big 1 in 2011 when he was left with residual spastic left hemiplegia.  Patient was followed in the office with the last visit being on 05/01/2018 and then was lost to follow-up.  He returns today due to new concerns raised by the family about decline in his memory and cognitive abilities as well as persistent complaints of dizziness for the last 2 years or so.  The patient's memory decline has been gradual and is slowly getting worse.  He often repeats himself.  He can ask the same question several times.  He often gets disoriented.  He gets irritated and annoyed easily and at times violent.  He has been needing more help with his daily activities.  He is often incontinent at night.  He can dress himself but needs help with the shower.  He had times of gotten lost when he went out by himself.  He is never left alone.  He does not have any delusions or hallucinations.  The patient has had difficulty walking since his last stroke in 2011.  He does have a cane can ambulate with it but does not walk a whole lot.  He complains of feeling dizzy and off  balance all the time.  He denies vertigo.  The son feels that the patient is just refusing to walk uses dizziness as an excuse.  He also complains of at times posterior neck pain and muscle tightness.  He denies any tingling numbness burning pain or radicular pain.  He said no recent falls or injuries.  He has not had any obvious symptoms of stroke recently.  He ambulates with a cane but dragging of his left foot.  He has not had any recent brain imaging study done.  Lab work on 08/12/2023 showed LDL-cholesterol to be 54 mg percent.  CBC and CMP and PSA were normal.  ROS:   14 system review of systems is positive for dizziness, difficulty walking, poor balance, memory loss, disorientation, confusion, agitation all other systems negative  PMH:  Past Medical History:  Diagnosis Date   Depression    DVT (deep venous thrombosis) (HCC)    Hyperlipidemia    Left-sided sensory deficit present    Seizure (HCC)    Stroke [facility])     Social History:  Social History   Socioeconomic History   Marital status: Married    Spouse name: Not on file   Number of children: 3   Years of education: 6th grade   Highest education level: Not on file  Occupational History   Not on file  Tobacco Use   Smoking status: Never   Smokeless tobacco: Never  Vaping Use   Vaping status: Never Used  Substance and Sexual Activity   Alcohol  use: No   Drug use: No   Sexual activity: Not Currently  Other Topics Concern   Not on file  Social History Narrative   Lives at home with wife & 1 child   Right handed   No caffeine    Social Drivers of Corporate investment banker Strain: Not on file  Food Insecurity: No Food Insecurity (08/12/2023)   Hunger Vital Sign    Worried About Running Out of Food in the Last Year: Never true    Ran Out of Food in the Last Year: Never true  Transportation Needs: No Transportation Needs (08/12/2023)   PRAPARE - Administrator, Civil Service (Medical): No    Lack of  Transportation (Non-Medical): No  Physical Activity: Not on file  Stress: Not on file  Social Connections: Not on file  Intimate Partner Violence: Not on file    Medications:   Current Outpatient Medications on File Prior to Visit  Medication Sig Dispense Refill   famotidine  (PEPCID ) 20 MG tablet Take 1 tablet (20 mg total) by mouth 2 (two) times daily. 180 tablet 3   FLUoxetine  (PROZAC ) 20 MG tablet TOME 1 PASTILLA POR VIA ORAL CADA MANANA 90 tablet 3   fluticasone  (FLONASE ) 50 MCG/ACT nasal spray Place 2 sprays into both nostrils daily. 16 g 6   hydrochlorothiazide  (MICROZIDE ) 12.5 MG capsule Take 1 capsule (12.5 mg total) by mouth daily. 90 capsule 3   meclizine  (ANTIVERT ) 12.5 MG tablet Take 1 tablet (12.5 mg total) by mouth 2 (two) times daily as needed for dizziness. 30 tablet 0   omeprazole  (PRILOSEC) 20 MG capsule Take 1 capsule (20 mg total) by mouth daily. 30 capsule 3   rosuvastatin  (CRESTOR ) 20 MG tablet Take 1 tablet (20 mg total) by mouth daily. 90 tablet 3   traZODone  (DESYREL ) 100 MG tablet Take 1 tablet (100 mg total) by mouth at bedtime. 90 tablet 2   No current facility-administered medications on file prior to visit.    Allergies:  No Active Allergies  Physical Exam General: well developed, well nourished, seated, in no evident distress Head: head normocephalic and atraumatic.   Neck: supple with no carotid or supraclavicular bruits Cardiovascular: regular rate and rhythm, no murmurs Musculoskeletal: no deformity but tightness of posterior neck and upper scapular muscles. Skin:  no rash/petichiae Vascular:  Normal pulses all extremities  Neurologic Exam Mental Status: Awake and fully alert. Oriented to place and time. Recent and remote memory poor. Attention span, concentration and fund of knowledge diminished mood and affect appropriate.  Clock drawing 3/4.  Diminished recall 0/3.  Able to name only 8 animals which can walk on for leg.  MMSE score 14/30 with  help from Spanish translator. Cranial Nerves: Fundoscopic exam reveals sharp disc margins. Pupils equal, briskly reactive to light. Extraocular movements full without nystagmus. Visual fields full to confrontation. Hearing intact. Facial sensation intact.  Mild left lower facial weakness., tongue, palate moves normally and symmetrically.  Motor: Spastic left hemiplegia with 3/5 strength proximally at the left shoulder and elbow and 0/5 strength at the left wrist and hand with nonfixed finger flexion contractures.  3/5 strength in the left lower extremity with left foot drop and ankle dorsiflexor weakness.  Tone is increased on the left with spasticity.  Normal strength on the right.   Sensory.:  Diminished left hemibody sensation to touch , pinprick ,  position and vibratory sensation.  Coordination: Normal on the right and impaired in the left due to weakness.   Gait and Station: Arises from chair without difficulty. Stance is normal. Gait spastic ataxic with circumduction and left foot drop Reflexes: 2+ and asymmetric and brisker on the left. Toes downgoing.   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  He also has chronic posterior neck pain likely of musculoskeletal etiology.     PLAN:I had a long discussion with the patient and his son using Spanish language interpreter about his remote stroke and chronic dizziness  as well as more recent memory and cognitive difficulties and answered questions.  I recommend further evaluation by checking memory panel labs, EEG ,MRI scan of the brain and cervical spine.  I also advised him to do regular neck stretching exercises for his musculoskeletal neck pain as well as use local heat application.  Advised him to increase participation in cognitively challenging activities like solving crossword puzzles, playing bridge and sudoku.  We also discussed memory compensation strategies.  May consider trial of Aricept or Namenda after above  results are back.  I encouraged him to start taking aspirin  81 mg daily for stroke prevention maintain aggressive risk factor modification with strict control of hypertension with blood pressure goal below 140/90, lipids with LDL cholesterol goal below 70 mg percent and diabetes with hemoglobin A1c goal below 6.5%.  He was also encouraged to use his cane at all times and discussed fall safety precautions.  He will return for follow-up in the future in 6 to 8 months or call earlier if necessary.   I personally spent a total of 60 minutes in the care of the patient today including getting/reviewing separately obtained history, performing a medically appropriate exam/evaluation, counseling and educating, placing orders, referring and communicating with other health care professionals, documenting clinical information in the EHR, independently interpreting results, and coordinating care.     Eather Popp, MD  Note: This document was prepared with digital dictation and possible smart phrase technology. Any transcriptional errors that result from this process are unintentional.

## 2023-11-22 ENCOUNTER — Telehealth: Payer: Self-pay | Admitting: Neurology

## 2023-11-22 NOTE — Telephone Encounter (Signed)
self-pay sent to [facility] [phone]

## 2023-11-23 LAB — HEMOGLOBIN A1C
Est. average glucose Bld gHb Est-mCnc: 126 mg/dL
Hgb A1c MFr Bld: 6 % — ABNORMAL HIGH (ref 4.8–5.6)

## 2023-11-23 LAB — DEMENTIA PANEL
Homocysteine: 10.5 umol/L (ref 0.0–17.2)
RPR Ser Ql: NONREACTIVE
TSH: 2.07 u[IU]/mL (ref 0.450–4.500)
Vitamin B-12: 398 pg/mL (ref 232–1245)

## 2023-11-23 LAB — LIPID PANEL
Chol/HDL Ratio: 2.5 ratio (ref 0.0–5.0)
Cholesterol, Total: 160 mg/dL (ref 100–199)
HDL: 65 mg/dL (ref >39)
LDL Chol Calc (NIH): 62 mg/dL (ref 0–99)
Triglycerides: 204 mg/dL — ABNORMAL HIGH (ref 0–149)
VLDL Cholesterol Cal: 33 mg/dL (ref 5–40)

## 2023-11-24 ENCOUNTER — Ambulatory Visit: Payer: Self-pay | Admitting: Neurology

## 2023-11-29 ENCOUNTER — Other Ambulatory Visit: Payer: Self-pay

## 2023-11-30 ENCOUNTER — Other Ambulatory Visit: Payer: Self-pay

## 2023-12-01 ENCOUNTER — Other Ambulatory Visit: Payer: Self-pay | Admitting: Nurse Practitioner

## 2023-12-01 DIAGNOSIS — R42 Dizziness and giddiness: Secondary | ICD-10-CM

## 2023-12-05 ENCOUNTER — Other Ambulatory Visit: Payer: Self-pay

## 2023-12-05 MED ORDER — MECLIZINE HCL 12.5 MG PO TABS
12.5000 mg | ORAL_TABLET | Freq: Two times a day (BID) | ORAL | 0 refills | Status: DC | PRN
Start: 2023-12-05 — End: 2024-01-14
  Filled 2023-12-05 – 2023-12-16 (×2): qty 30, 15d supply, fill #0

## 2023-12-14 ENCOUNTER — Other Ambulatory Visit: Payer: Self-pay

## 2023-12-16 ENCOUNTER — Other Ambulatory Visit: Payer: Self-pay

## 2023-12-26 ENCOUNTER — Other Ambulatory Visit: Payer: Self-pay

## 2023-12-29 ENCOUNTER — Other Ambulatory Visit: Payer: Self-pay

## 2024-01-14 ENCOUNTER — Other Ambulatory Visit: Payer: Self-pay | Admitting: Nurse Practitioner

## 2024-01-14 DIAGNOSIS — R42 Dizziness and giddiness: Secondary | ICD-10-CM

## 2024-01-16 ENCOUNTER — Other Ambulatory Visit: Payer: Self-pay

## 2024-01-16 MED ORDER — MECLIZINE HCL 12.5 MG PO TABS
12.5000 mg | ORAL_TABLET | Freq: Two times a day (BID) | ORAL | 0 refills | Status: DC | PRN
  Filled 2024-01-16: qty 30, 15d supply, fill #0

## 2024-02-06 ENCOUNTER — Other Ambulatory Visit: Payer: Self-pay

## 2024-02-13 ENCOUNTER — Other Ambulatory Visit: Payer: Self-pay | Admitting: Nurse Practitioner

## 2024-02-13 ENCOUNTER — Encounter: Payer: Self-pay | Admitting: Nurse Practitioner

## 2024-02-13 ENCOUNTER — Other Ambulatory Visit: Payer: Self-pay

## 2024-02-13 ENCOUNTER — Ambulatory Visit (INDEPENDENT_AMBULATORY_CARE_PROVIDER_SITE_OTHER): Payer: Self-pay | Admitting: Nurse Practitioner

## 2024-02-13 VITALS — BP 133/78 | HR 78 | Wt 204.4 lb

## 2024-02-13 DIAGNOSIS — I10 Essential (primary) hypertension: Secondary | ICD-10-CM

## 2024-02-13 DIAGNOSIS — R32 Unspecified urinary incontinence: Secondary | ICD-10-CM

## 2024-02-13 DIAGNOSIS — F320 Major depressive disorder, single episode, mild: Secondary | ICD-10-CM

## 2024-02-13 DIAGNOSIS — R42 Dizziness and giddiness: Secondary | ICD-10-CM

## 2024-02-13 DIAGNOSIS — I639 Cerebral infarction, unspecified: Secondary | ICD-10-CM

## 2024-02-13 MED ORDER — MECLIZINE HCL 12.5 MG PO TABS
12.5000 mg | ORAL_TABLET | Freq: Two times a day (BID) | ORAL | 0 refills | Status: DC | PRN
  Filled 2024-02-13: qty 30, 15d supply, fill #0

## 2024-02-13 MED ORDER — CETIRIZINE HCL 10 MG PO TABS
10.0000 mg | ORAL_TABLET | Freq: Every day | ORAL | 11 refills | Status: DC
  Filled 2024-02-13: qty 30, 30d supply, fill #0
  Filled 2024-04-01: qty 30, 30d supply, fill #1

## 2024-02-13 MED ORDER — FLUOXETINE HCL 20 MG PO TABS
20.0000 mg | ORAL_TABLET | Freq: Every day | ORAL | 3 refills | Status: DC
  Filled 2024-04-01: qty 90, 90d supply, fill #0

## 2024-02-13 MED ORDER — ROSUVASTATIN CALCIUM 20 MG PO TABS
20.0000 mg | ORAL_TABLET | Freq: Every day | ORAL | 3 refills | Status: DC
  Filled 2024-04-01: qty 90, 90d supply, fill #0

## 2024-02-13 NOTE — Progress Notes (Signed)
 Subjective   Patient ID: Mark Robles, male    DOB: 1950/04/30, 73 y.o.   MRN: [id number]  Chief Complaint  Patient presents with   urinary incontinance    Would like to have a referral for prostate as well as incontinence    Dizziness   Memory Loss   Insomnia    Not able to go to sleep, brain stays up past bed time    Referring provider: Oley Bascom RAMAN, NP  Refugio Vandevoorde is a 73 y.o. 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  Overall he has been stable since his last visit here.  He does still complain of dizziness.  Patient did have a recent appointment with neurology for this issue and history of stroke.  He will need to follow-up with neurology for dizziness and memory loss.  Patient has also been having issues with urinary incontinence.  Will place referral to urology.  Denies f/c/s, n/v/d, hemoptysis, PND, leg swelling. Denies chest pain or edema.   Note: Recent A1c in the chart was 6.0.    No Known Allergies  Immunization History  Administered Date(s) Administered   Fluad Trivalent(High Dose 65+) 02/11/2023   Pneumococcal Conjugate-13 07/13/2018   Pneumococcal Polysaccharide-23 05/16/2017   Tdap 05/16/2017   Unspecified SARS-COV-2 Vaccination 05/30/2019, 07/25/2019    Tobacco History: Social History   Tobacco Use  Smoking Status Never  Smokeless Tobacco Never   Counseling given: Not Answered   Outpatient Encounter Medications as of 02/13/2024  Medication Sig   aspirin  EC 81 MG tablet Take 1 tablet (81 mg total) by mouth daily. Swallow whole.   cetirizine (ZYRTEC) 10 MG tablet Take 1 tablet (10 mg total) by mouth daily.   famotidine  (PEPCID ) 20 MG tablet Take 1 tablet (20 mg total) by mouth 2 (two) times  daily.   fluticasone  (FLONASE ) 50 MCG/ACT nasal spray Place 2 sprays into both nostrils daily.   hydrochlorothiazide  (MICROZIDE ) 12.5 MG capsule Take 1 capsule (12.5 mg total) by mouth daily.   omeprazole  (PRILOSEC) 20 MG capsule Take 1 capsule (20 mg total) by mouth daily.   traZODone  (DESYREL ) 100 MG tablet Take 1 tablet (100 mg total) by mouth at bedtime.   [DISCONTINUED] FLUoxetine  (PROZAC ) 20 MG tablet TOME 1 PASTILLA POR VIA ORAL CADA MANANA   [DISCONTINUED] meclizine  (ANTIVERT ) 12.5 MG tablet Take 1 tablet (12.5 mg total) by mouth 2 (two) times daily as needed for dizziness.   [DISCONTINUED] rosuvastatin  (CRESTOR ) 20 MG tablet Take 1 tablet (20 mg total) by mouth daily.   FLUoxetine  (PROZAC ) 20 MG tablet TOME 1 PASTILLA POR VIA ORAL CADA MANANA   meclizine  (ANTIVERT ) 12.5 MG tablet Take 1 tablet (12.5 mg total) by mouth 2 (two) times daily as needed for dizziness.   rosuvastatin  (CRESTOR ) 20 MG tablet Take 1 tablet (20 mg total) by mouth daily.   No facility-administered encounter medications on file as of 02/13/2024.    Review of Systems  Review of Systems  Constitutional: Negative.   HENT: Negative.    Cardiovascular: Negative.   Gastrointestinal: Negative.   Allergic/Immunologic: Negative.   Neurological: Negative.   Psychiatric/Behavioral: Negative.       Objective:   BP 133/78 (BP Location: Left Arm, Patient Position: Sitting, Cuff Size: Large)   Pulse 78   Wt  204 lb 6.4 oz (92.7 kg)   SpO2 98%   BMI 34.01 kg/m   Wt Readings from Last 5 Encounters:  02/13/24 204 lb 6.4 oz (92.7 kg)  11/21/23 197 lb 12.8 oz (89.7 kg)  08/12/23 197 lb 3.2 oz (89.4 kg)  02/11/23 197 lb 3.2 oz (89.4 kg)  08/12/22 201 lb 9.6 oz (91.4 kg)     Physical Exam Vitals and nursing note reviewed.  Constitutional:      General: He is not in acute distress.    Appearance: He is well-developed.  Cardiovascular:     Rate and Rhythm: Normal rate and regular rhythm.  Pulmonary:      Effort: Pulmonary effort is normal.     Breath sounds: Normal breath sounds.  Skin:    General: Skin is warm and dry.  Neurological:     Mental Status: He is alert and oriented to person, place, and time.       Assessment & Plan:   Urinary incontinence, unspecified type -     Ambulatory referral to Urology  Cerebrovascular accident (CVA), unspecified mechanism (HCC) -     Ambulatory referral to Urology -     Rosuvastatin  Calcium ; Take 1 tablet (20 mg total) by mouth daily.  Dispense: 90 tablet; Refill: 3  Essential hypertension -     Rosuvastatin  Calcium ; Take 1 tablet (20 mg total) by mouth daily.  Dispense: 90 tablet; Refill: 3  Dizziness -     Meclizine  HCl; Take 1 tablet (12.5 mg total) by mouth 2 (two) times daily as needed for dizziness.  Dispense: 30 tablet; Refill: 0  Current mild episode of major depressive disorder without prior episode -     FLUoxetine  HCl; TOME 1 PASTILLA POR VIA ORAL CADA MANANA  Dispense: 90 tablet; Refill: 3  Other orders -     Cetirizine HCl; Take 1 tablet (10 mg total) by mouth daily.  Dispense: 30 tablet; Refill: 11     Return in about 3 months (around 05/15/2024).     Bascom GORMAN Borer, NP 02/13/2024

## 2024-02-27 ENCOUNTER — Other Ambulatory Visit: Payer: Self-pay

## 2024-03-21 ENCOUNTER — Other Ambulatory Visit: Payer: Self-pay

## 2024-04-01 ENCOUNTER — Other Ambulatory Visit: Payer: Self-pay | Admitting: Nurse Practitioner

## 2024-04-01 DIAGNOSIS — R42 Dizziness and giddiness: Secondary | ICD-10-CM

## 2024-04-02 ENCOUNTER — Other Ambulatory Visit: Payer: Self-pay

## 2024-04-03 NOTE — Telephone Encounter (Signed)
 Please advise [facility]

## 2024-04-04 ENCOUNTER — Other Ambulatory Visit: Payer: Self-pay

## 2024-04-04 MED ORDER — MECLIZINE HCL 12.5 MG PO TABS
12.5000 mg | ORAL_TABLET | Freq: Two times a day (BID) | ORAL | 0 refills | Status: DC | PRN
  Filled 2024-04-04: qty 30, 15d supply, fill #0

## 2024-04-10 ENCOUNTER — Other Ambulatory Visit: Payer: Self-pay

## 2024-04-26 ENCOUNTER — Other Ambulatory Visit: Payer: Self-pay | Admitting: Nurse Practitioner

## 2024-04-27 ENCOUNTER — Other Ambulatory Visit: Payer: Self-pay

## 2024-04-27 MED ORDER — OMEPRAZOLE 20 MG PO CPDR
20.0000 mg | DELAYED_RELEASE_CAPSULE | Freq: Every day | ORAL | 3 refills | Status: DC
  Filled 2024-04-27: qty 30, 30d supply, fill #0

## 2024-05-01 ENCOUNTER — Other Ambulatory Visit: Payer: Self-pay

## 2024-05-16 ENCOUNTER — Other Ambulatory Visit: Payer: Self-pay

## 2024-05-16 ENCOUNTER — Encounter: Payer: Self-pay | Admitting: Nurse Practitioner

## 2024-05-16 ENCOUNTER — Ambulatory Visit: Payer: Self-pay | Admitting: Nurse Practitioner

## 2024-05-16 VITALS — BP 127/75 | HR 86 | Temp 97.5°F | Wt 200.8 lb

## 2024-05-16 DIAGNOSIS — R7303 Prediabetes: Secondary | ICD-10-CM

## 2024-05-16 DIAGNOSIS — I10 Essential (primary) hypertension: Secondary | ICD-10-CM

## 2024-05-16 DIAGNOSIS — R42 Dizziness and giddiness: Secondary | ICD-10-CM

## 2024-05-16 DIAGNOSIS — F320 Major depressive disorder, single episode, mild: Secondary | ICD-10-CM

## 2024-05-16 DIAGNOSIS — I639 Cerebral infarction, unspecified: Secondary | ICD-10-CM

## 2024-05-16 LAB — POCT GLYCOSYLATED HEMOGLOBIN (HGB A1C): Hemoglobin A1C: 5.8 % — AB (ref 4.0–5.6)

## 2024-05-16 MED ORDER — FLUOXETINE HCL 20 MG PO CAPS
20.0000 mg | ORAL_CAPSULE | Freq: Every day | ORAL | 3 refills | Status: AC
  Filled 2024-05-16 (×2): qty 90, 90d supply, fill #0

## 2024-05-16 MED ORDER — OMEPRAZOLE 20 MG PO CPDR
20.0000 mg | DELAYED_RELEASE_CAPSULE | Freq: Every day | ORAL | 3 refills | Status: AC
  Filled 2024-05-16: qty 30, 30d supply, fill #0

## 2024-05-16 MED ORDER — ROSUVASTATIN CALCIUM 20 MG PO TABS
20.0000 mg | ORAL_TABLET | Freq: Every day | ORAL | 3 refills | Status: AC
  Filled 2024-05-16: qty 90, 90d supply, fill #0

## 2024-05-16 MED ORDER — CETIRIZINE HCL 10 MG PO TABS
10.0000 mg | ORAL_TABLET | Freq: Every day | ORAL | 11 refills | Status: AC
  Filled 2024-05-16: qty 30, 30d supply, fill #0

## 2024-05-16 MED ORDER — MECLIZINE HCL 12.5 MG PO TABS
12.5000 mg | ORAL_TABLET | Freq: Two times a day (BID) | ORAL | 0 refills | Status: AC | PRN
  Filled 2024-05-16: qty 30, 15d supply, fill #0

## 2024-05-16 MED ORDER — HYDROCHLOROTHIAZIDE 12.5 MG PO CAPS
12.5000 mg | ORAL_CAPSULE | Freq: Every day | ORAL | 3 refills | Status: AC
  Filled 2024-05-16: qty 90, 90d supply, fill #0

## 2024-05-16 NOTE — Patient Instructions (Signed)
 Urology: [phone]

## 2024-05-16 NOTE — Progress Notes (Signed)
 "  Subjective   Patient ID: Mark Robles, male    DOB: 1950-05-03, 74 y.o.   MRN: [id number]  Chief Complaint  Patient presents with   Hypertension    Referring provider: Oley Bascom RAMAN, NP  Mark Robles is a 74 y.o. 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  Patient has been following with neurology for history of stroke.  Overall stable in office today.  Will need labs today. A1C today in office is 5.8. Denies f/c/s, n/v/d, hemoptysis, PND, leg swelling. Denies chest pain or edema.    Allergies[1]  Immunization History  Administered Date(s) Administered   Fluad Trivalent(High Dose 65+) 02/11/2023   Pneumococcal Conjugate-13 07/13/2018   Pneumococcal Polysaccharide-23 05/16/2017   Tdap 05/16/2017   Unspecified SARS-COV-2 Vaccination 05/30/2019, 07/25/2019    Tobacco History: Tobacco Use History[2] Counseling given: Not Answered   Outpatient Encounter Medications as of 05/16/2024  Medication Sig   aspirin  EC 81 MG tablet Take 1 tablet (81 mg total) by mouth daily. Swallow whole.   famotidine  (PEPCID ) 20 MG tablet Take 1 tablet (20 mg total) by mouth 2 (two) times daily.   traZODone  (DESYREL ) 100 MG tablet Take 1 tablet (100 mg total) by mouth at bedtime.   [DISCONTINUED] cetirizine  (ZYRTEC ) 10 MG tablet Take 1 tablet (10 mg total) by mouth daily.   [DISCONTINUED] FLUoxetine  (PROZAC ) 20 MG tablet TOME 1 PASTILLA POR VIA ORAL CADA MANANA   [DISCONTINUED] hydrochlorothiazide  (MICROZIDE ) 12.5 MG capsule Take 1 capsule (12.5 mg total) by mouth daily.   [DISCONTINUED] meclizine  (ANTIVERT ) 12.5 MG tablet Take 1 tablet (12.5 mg total) by mouth 2 (two) times daily as needed for dizziness.    [DISCONTINUED] omeprazole  (PRILOSEC) 20 MG capsule Take 1 capsule (20 mg total) by mouth daily.   [DISCONTINUED] rosuvastatin  (CRESTOR ) 20 MG tablet Take 1 tablet (20 mg total) by mouth daily.   cetirizine  (ZYRTEC ) 10 MG tablet Take 1 tablet (10 mg total) by mouth daily.   FLUoxetine  (PROZAC ) 20 MG tablet TOME 1 PASTILLA POR VIA ORAL CADA MANANA   fluticasone  (FLONASE ) 50 MCG/ACT nasal spray Place 2 sprays into both nostrils daily. (Patient not taking: Reported on 05/16/2024)   hydrochlorothiazide  (MICROZIDE ) 12.5 MG capsule Take 1 capsule (12.5 mg total) by mouth daily.   meclizine  (ANTIVERT ) 12.5 MG tablet Take 1 tablet (12.5 mg total) by mouth 2 (two) times daily as needed for dizziness.   omeprazole  (PRILOSEC) 20 MG capsule Take 1 capsule (20 mg total) by mouth daily.   rosuvastatin  (CRESTOR ) 20 MG tablet Take 1 tablet (20 mg total) by mouth daily.   No facility-administered encounter medications on file as of 05/16/2024.    Review of Systems  Review of Systems  Constitutional: Negative.   HENT: Negative.    Cardiovascular: Negative.   Gastrointestinal: Negative.   Allergic/Immunologic: Negative.   Neurological: Negative.   Psychiatric/Behavioral: Negative.       Objective:   BP 127/75   Pulse 86   Temp (!) 97.5 F (36.4 C) (Temporal)   Wt 200 lb 12.8 oz (91.1 kg)   SpO2 95%   BMI 33.41 kg/m   Wt Readings from Last 5 Encounters:  05/16/24 200 lb 12.8 oz (91.1 kg)  02/13/24 204 lb 6.4 oz (92.7 kg)  11/21/23 197  lb 12.8 oz (89.7 kg)  08/12/23 197 lb 3.2 oz (89.4 kg)  02/11/23 197 lb 3.2 oz (89.4 kg)     Physical Exam Vitals and nursing note reviewed.  Constitutional:      General: He is not in acute distress.    Appearance: He is well-developed.  Cardiovascular:     Rate and Rhythm: Normal rate and regular rhythm.  Pulmonary:     Effort: Pulmonary effort is normal.     Breath sounds: Normal breath sounds.  Skin:    General: Skin is warm and dry.  Neurological:      Mental Status: He is alert and oriented to person, place, and time.       Assessment & Plan:   Essential hypertension -     CBC -     Comprehensive metabolic panel with GFR -     Rosuvastatin  Calcium ; Take 1 tablet (20 mg total) by mouth daily.  Dispense: 90 tablet; Refill: 3 -     hydroCHLOROthiazide ; Take 1 capsule (12.5 mg total) by mouth daily.  Dispense: 90 capsule; Refill: 3  Prediabetes -     POCT glycosylated hemoglobin (Hb A1C)  Dizziness -     Meclizine  HCl; Take 1 tablet (12.5 mg total) by mouth 2 (two) times daily as needed for dizziness.  Dispense: 30 tablet; Refill: 0  Current mild episode of major depressive disorder without prior episode -     FLUoxetine  HCl; TOME 1 PASTILLA POR VIA ORAL CADA MANANA  Dispense: 90 tablet; Refill: 3  Cerebrovascular accident (CVA), unspecified mechanism (HCC) -     Rosuvastatin  Calcium ; Take 1 tablet (20 mg total) by mouth daily.  Dispense: 90 tablet; Refill: 3  Other orders -     Cetirizine  HCl; Take 1 tablet (10 mg total) by mouth daily.  Dispense: 30 tablet; Refill: 11 -     Omeprazole ; Take 1 capsule (20 mg total) by mouth daily.  Dispense: 30 capsule; Refill: 3     Return in about 6 months (around 11/13/2024).   Bascom GORMAN Borer, NP 05/16/2024     [1] No Known Allergies [2]  Social History Tobacco Use  Smoking Status Never  Smokeless Tobacco Never   "

## 2024-05-17 ENCOUNTER — Ambulatory Visit: Payer: Self-pay | Admitting: Nurse Practitioner

## 2024-05-17 LAB — COMPREHENSIVE METABOLIC PANEL WITH GFR
ALT: 52 [IU]/L — ABNORMAL HIGH (ref 0–44)
AST: 34 [IU]/L (ref 0–40)
Albumin: 4.3 g/dL (ref 3.8–4.8)
Alkaline Phosphatase: 55 [IU]/L (ref 47–123)
BUN/Creatinine Ratio: 9 — ABNORMAL LOW (ref 10–24)
BUN: 9 mg/dL (ref 8–27)
Bilirubin Total: 0.3 mg/dL (ref 0.0–1.2)
CO2: 26 mmol/L (ref 20–29)
Calcium: 9.3 mg/dL (ref 8.6–10.2)
Chloride: 101 mmol/L (ref 96–106)
Creatinine, Ser: 1 mg/dL (ref 0.76–1.27)
Globulin, Total: 2.9 g/dL (ref 1.5–4.5)
Glucose: 162 mg/dL — ABNORMAL HIGH (ref 70–99)
Potassium: 4.3 mmol/L (ref 3.5–5.2)
Sodium: 138 mmol/L (ref 134–144)
Total Protein: 7.2 g/dL (ref 6.0–8.5)
eGFR: 80 mL/min/{1.73_m2}

## 2024-05-17 LAB — CBC
Hematocrit: 47.1 % (ref 37.5–51.0)
Hemoglobin: 15.1 g/dL (ref 13.0–17.7)
MCH: 30.8 pg (ref 26.6–33.0)
MCHC: 32.1 g/dL (ref 31.5–35.7)
MCV: 96 fL (ref 79–97)
Platelets: 223 10*3/uL (ref 150–450)
RBC: 4.9 x10E6/uL (ref 4.14–5.80)
RDW: 13.4 % (ref 11.6–15.4)
WBC: 5.7 10*3/uL (ref 3.4–10.8)

## 2024-10-22 ENCOUNTER — Ambulatory Visit: Payer: Self-pay | Admitting: Neurology

## 2024-11-14 ENCOUNTER — Ambulatory Visit: Payer: Self-pay | Admitting: Nurse Practitioner
